# Patient Record
Sex: Female | Born: 1945 | Race: White | Hispanic: No | Marital: Single | Smoking: Never smoker
Health system: Southern US, Community
[De-identification: ages and names within clinical notes are randomized; demographics above are authoritative.]

## PROBLEM LIST (undated history)

## (undated) DIAGNOSIS — M810 Age-related osteoporosis without current pathological fracture: Secondary | ICD-10-CM

## (undated) DIAGNOSIS — M545 Low back pain, unspecified: Secondary | ICD-10-CM

## (undated) DIAGNOSIS — R002 Palpitations: Secondary | ICD-10-CM

## (undated) DIAGNOSIS — H269 Unspecified cataract: Secondary | ICD-10-CM

## (undated) DIAGNOSIS — F341 Dysthymic disorder: Secondary | ICD-10-CM

## (undated) DIAGNOSIS — F319 Bipolar disorder, unspecified: Secondary | ICD-10-CM

## (undated) DIAGNOSIS — J309 Allergic rhinitis, unspecified: Secondary | ICD-10-CM

## (undated) DIAGNOSIS — G250 Essential tremor: Secondary | ICD-10-CM

## (undated) DIAGNOSIS — G252 Other specified forms of tremor: Secondary | ICD-10-CM

## (undated) DIAGNOSIS — E785 Hyperlipidemia, unspecified: Secondary | ICD-10-CM

## (undated) DIAGNOSIS — F419 Anxiety disorder, unspecified: Secondary | ICD-10-CM

## (undated) DIAGNOSIS — D126 Benign neoplasm of colon, unspecified: Secondary | ICD-10-CM

## (undated) DIAGNOSIS — M1990 Unspecified osteoarthritis, unspecified site: Secondary | ICD-10-CM

## (undated) HISTORY — DX: Low back pain: M54.5

## (undated) HISTORY — DX: Essential tremor: G25.0

## (undated) HISTORY — DX: Anxiety disorder, unspecified: F41.9

## (undated) HISTORY — PX: BREAST EXCISIONAL BIOPSY: SUR124

## (undated) HISTORY — PX: EYE SURGERY: SHX253

## (undated) HISTORY — PX: POLYPECTOMY: SHX149

## (undated) HISTORY — DX: Low back pain, unspecified: M54.50

## (undated) HISTORY — DX: Age-related osteoporosis without current pathological fracture: M81.0

## (undated) HISTORY — DX: Hyperlipidemia, unspecified: E78.5

## (undated) HISTORY — DX: Unspecified cataract: H26.9

## (undated) HISTORY — PX: ANAL FISSURE REPAIR: SHX2312

## (undated) HISTORY — PX: OTHER SURGICAL HISTORY: SHX169

## (undated) HISTORY — PX: JOINT REPLACEMENT: SHX530

## (undated) HISTORY — DX: Allergic rhinitis, unspecified: J30.9

## (undated) HISTORY — DX: Dysthymic disorder: F34.1

## (undated) HISTORY — PX: CATARACT EXTRACTION: SUR2

## (undated) HISTORY — DX: Other specified forms of tremor: G25.2

## (undated) HISTORY — PX: BREAST BIOPSY: SHX20

---
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
## 1998-05-28 ENCOUNTER — Other Ambulatory Visit: Admission: RE | Admit: 1998-05-28 | Discharge: 1998-05-28 | Payer: Self-pay | Admitting: Obstetrics & Gynecology

## 1998-10-10 ENCOUNTER — Other Ambulatory Visit: Admission: RE | Admit: 1998-10-10 | Discharge: 1998-10-10 | Payer: Self-pay | Admitting: Obstetrics and Gynecology

## 1998-10-18 ENCOUNTER — Encounter: Admission: RE | Admit: 1998-10-18 | Discharge: 1998-10-18 | Payer: Self-pay | Admitting: Internal Medicine

## 1999-04-12 ENCOUNTER — Ambulatory Visit (HOSPITAL_BASED_OUTPATIENT_CLINIC_OR_DEPARTMENT_OTHER): Admission: RE | Admit: 1999-04-12 | Discharge: 1999-04-12 | Payer: Self-pay | Admitting: General Surgery

## 2000-01-24 ENCOUNTER — Encounter: Payer: Self-pay | Admitting: General Surgery

## 2000-01-24 ENCOUNTER — Encounter: Admission: RE | Admit: 2000-01-24 | Discharge: 2000-01-24 | Payer: Self-pay | Admitting: General Surgery

## 2000-02-10 ENCOUNTER — Other Ambulatory Visit: Admission: RE | Admit: 2000-02-10 | Discharge: 2000-02-10 | Payer: Self-pay | Admitting: Obstetrics and Gynecology

## 2000-04-08 ENCOUNTER — Other Ambulatory Visit: Admission: RE | Admit: 2000-04-08 | Discharge: 2000-04-08 | Payer: Self-pay | Admitting: Obstetrics and Gynecology

## 2000-04-08 ENCOUNTER — Encounter (INDEPENDENT_AMBULATORY_CARE_PROVIDER_SITE_OTHER): Payer: Self-pay | Admitting: Specialist

## 2000-04-14 ENCOUNTER — Encounter: Payer: Self-pay | Admitting: Obstetrics and Gynecology

## 2000-04-14 ENCOUNTER — Other Ambulatory Visit: Admission: RE | Admit: 2000-04-14 | Discharge: 2000-04-14 | Payer: Self-pay | Admitting: Obstetrics and Gynecology

## 2000-04-14 ENCOUNTER — Encounter: Admission: RE | Admit: 2000-04-14 | Discharge: 2000-04-14 | Payer: Self-pay | Admitting: Obstetrics and Gynecology

## 2000-04-14 ENCOUNTER — Encounter (INDEPENDENT_AMBULATORY_CARE_PROVIDER_SITE_OTHER): Payer: Self-pay | Admitting: *Deleted

## 2001-03-03 ENCOUNTER — Encounter: Admission: RE | Admit: 2001-03-03 | Discharge: 2001-03-03 | Payer: Self-pay | Admitting: Obstetrics and Gynecology

## 2001-03-03 ENCOUNTER — Encounter: Payer: Self-pay | Admitting: Obstetrics and Gynecology

## 2002-03-14 ENCOUNTER — Encounter: Payer: Self-pay | Admitting: General Surgery

## 2002-03-14 ENCOUNTER — Encounter: Admission: RE | Admit: 2002-03-14 | Discharge: 2002-03-14 | Payer: Self-pay | Admitting: General Surgery

## 2004-01-23 ENCOUNTER — Encounter: Admission: RE | Admit: 2004-01-23 | Discharge: 2004-01-23 | Payer: Self-pay | Admitting: Obstetrics and Gynecology

## 2004-07-22 ENCOUNTER — Other Ambulatory Visit: Admission: RE | Admit: 2004-07-22 | Discharge: 2004-07-22 | Payer: Self-pay | Admitting: Obstetrics and Gynecology

## 2005-02-11 ENCOUNTER — Encounter: Admission: RE | Admit: 2005-02-11 | Discharge: 2005-02-11 | Payer: Self-pay | Admitting: Obstetrics and Gynecology

## 2005-07-21 ENCOUNTER — Other Ambulatory Visit: Admission: RE | Admit: 2005-07-21 | Discharge: 2005-07-21 | Payer: Self-pay | Admitting: Obstetrics and Gynecology

## 2005-08-13 ENCOUNTER — Encounter: Admission: RE | Admit: 2005-08-13 | Discharge: 2005-08-13 | Payer: Self-pay | Admitting: General Surgery

## 2006-05-27 ENCOUNTER — Encounter: Admission: RE | Admit: 2006-05-27 | Discharge: 2006-05-27 | Payer: Self-pay | Admitting: Obstetrics and Gynecology

## 2006-07-27 ENCOUNTER — Other Ambulatory Visit: Admission: RE | Admit: 2006-07-27 | Discharge: 2006-07-27 | Payer: Self-pay | Admitting: Obstetrics and Gynecology

## 2007-09-07 ENCOUNTER — Other Ambulatory Visit: Admission: RE | Admit: 2007-09-07 | Discharge: 2007-09-07 | Payer: Self-pay | Admitting: Obstetrics and Gynecology

## 2007-10-27 ENCOUNTER — Ambulatory Visit (HOSPITAL_COMMUNITY): Admission: RE | Admit: 2007-10-27 | Discharge: 2007-10-27 | Payer: Self-pay | Admitting: Obstetrics and Gynecology

## 2008-06-21 ENCOUNTER — Ambulatory Visit (HOSPITAL_COMMUNITY): Admission: RE | Admit: 2008-06-21 | Discharge: 2008-06-21 | Payer: Self-pay | Admitting: Internal Medicine

## 2008-10-31 ENCOUNTER — Ambulatory Visit (HOSPITAL_COMMUNITY): Admission: RE | Admit: 2008-10-31 | Discharge: 2008-10-31 | Payer: Self-pay | Admitting: *Deleted

## 2009-03-01 ENCOUNTER — Encounter: Payer: Self-pay | Admitting: Internal Medicine

## 2009-03-01 LAB — CONVERTED CEMR LAB

## 2009-04-10 ENCOUNTER — Encounter: Admission: RE | Admit: 2009-04-10 | Discharge: 2009-04-10 | Payer: Self-pay | Admitting: Internal Medicine

## 2010-01-17 ENCOUNTER — Ambulatory Visit (HOSPITAL_COMMUNITY): Admission: RE | Admit: 2010-01-17 | Discharge: 2010-01-17 | Payer: Self-pay | Admitting: Obstetrics & Gynecology

## 2010-01-17 LAB — HM MAMMOGRAPHY

## 2010-09-16 ENCOUNTER — Inpatient Hospital Stay (HOSPITAL_COMMUNITY)
Admission: EM | Admit: 2010-09-16 | Discharge: 2010-09-17 | Payer: Self-pay | Source: Home / Self Care | Attending: Cardiology | Admitting: Cardiology

## 2010-09-16 ENCOUNTER — Encounter: Payer: Self-pay | Admitting: Internal Medicine

## 2010-09-16 LAB — CONVERTED CEMR LAB
BUN: 15 mg/dL
Chloride: 103 meq/L
Creatinine, Ser: 1 mg/dL
Glucose, Bld: 99 mg/dL
HCT: 42 %
Hemoglobin: 14.3 g/dL
MCV: 91.6 fL
Platelets: 289 10*3/uL
Potassium: 3.6 meq/L
RBC: 4.28 M/uL
RDW: 13.6 %
Sodium: 138 meq/L
WBC: 11.8 10*3/uL

## 2010-09-17 ENCOUNTER — Encounter: Payer: Self-pay | Admitting: Cardiology

## 2010-09-18 LAB — CBC
HCT: 39.2 % (ref 36.0–46.0)
Hemoglobin: 12.5 g/dL (ref 12.0–15.0)
MCH: 29.2 pg (ref 26.0–34.0)
MCHC: 31.9 g/dL (ref 30.0–36.0)
MCV: 91.6 fL (ref 78.0–100.0)
Platelets: 289 10*3/uL (ref 150–400)
RBC: 4.28 MIL/uL (ref 3.87–5.11)
RDW: 13.6 % (ref 11.5–15.5)
WBC: 11.8 10*3/uL — ABNORMAL HIGH (ref 4.0–10.5)

## 2010-09-18 LAB — CARDIAC PANEL(CRET KIN+CKTOT+MB+TROPI)
CK, MB: 1.5 ng/mL (ref 0.3–4.0)
Relative Index: INVALID (ref 0.0–2.5)
Total CK: 53 U/L (ref 7–177)
Troponin I: <0.01 ng/mL (ref 0.00–0.06)

## 2010-09-18 LAB — CK TOTAL AND CKMB (NOT AT ARMC)
CK, MB: 0.9 ng/mL (ref 0.3–4.0)
Relative Index: INVALID (ref 0.0–2.5)
Total CK: 33 U/L (ref 7–177)

## 2010-09-18 LAB — POCT I-STAT, CHEM 8
BUN: 15 mg/dL (ref 6–23)
Calcium, Ion: 1.18 mmol/L (ref 1.12–1.32)
Chloride: 103 mEq/L (ref 96–112)
Creatinine, Ser: 1 mg/dL (ref 0.4–1.2)
Glucose, Bld: 99 mg/dL (ref 70–99)
HCT: 42 % (ref 36.0–46.0)
Hemoglobin: 14.3 g/dL (ref 12.0–15.0)
Potassium: 3.6 mEq/L (ref 3.5–5.1)
Sodium: 138 mEq/L (ref 135–145)
TCO2: 29 mmol/L (ref 0–100)

## 2010-09-18 LAB — URINALYSIS, ROUTINE W REFLEX MICROSCOPIC
Bilirubin Urine: NEGATIVE
Hgb urine dipstick: NEGATIVE
Ketones, ur: 15 mg/dL — AB
Leukocytes, UA: NEGATIVE
Nitrite: NEGATIVE
Protein, ur: NEGATIVE mg/dL
Specific Gravity, Urine: 1.013 (ref 1.005–1.030)
Urine Glucose, Fasting: NEGATIVE mg/dL
Urobilinogen, UA: 0.2 mg/dL (ref 0.0–1.0)
pH: 8 (ref 5.0–8.0)

## 2010-09-18 LAB — URINE MICROSCOPIC-ADD ON

## 2010-09-18 LAB — POCT CARDIAC MARKERS
CKMB, poc: <1 ng/mL — ABNORMAL LOW (ref 1.0–8.0)
CKMB, poc: <1 ng/mL — ABNORMAL LOW (ref 1.0–8.0)
Myoglobin, poc: 76.8 ng/mL (ref 12–200)
Myoglobin, poc: 56.6 ng/mL (ref 12–200)
Troponin i, poc: <0.05 ng/mL (ref 0.00–0.09)
Troponin i, poc: <0.05 ng/mL (ref 0.00–0.09)

## 2010-09-18 LAB — TROPONIN I: Troponin I: <0.01 ng/mL (ref 0.00–0.06)

## 2010-09-23 ENCOUNTER — Encounter: Payer: Self-pay | Admitting: Internal Medicine

## 2010-09-25 ENCOUNTER — Telehealth (INDEPENDENT_AMBULATORY_CARE_PROVIDER_SITE_OTHER): Payer: Self-pay | Admitting: *Deleted

## 2010-09-26 ENCOUNTER — Encounter: Payer: Self-pay | Admitting: *Deleted

## 2010-09-26 ENCOUNTER — Encounter: Payer: Self-pay | Admitting: Cardiology

## 2010-09-26 ENCOUNTER — Ambulatory Visit: Admission: RE | Admit: 2010-09-26 | Discharge: 2010-09-26 | Payer: Self-pay | Source: Home / Self Care

## 2010-09-26 ENCOUNTER — Encounter (HOSPITAL_COMMUNITY)
Admission: RE | Admit: 2010-09-26 | Discharge: 2010-10-01 | Payer: Self-pay | Source: Home / Self Care | Attending: Cardiology | Admitting: Cardiology

## 2010-09-30 ENCOUNTER — Ambulatory Visit
Admission: RE | Admit: 2010-09-30 | Discharge: 2010-09-30 | Payer: Self-pay | Source: Home / Self Care | Attending: Physician Assistant | Admitting: Physician Assistant

## 2010-09-30 DIAGNOSIS — F341 Dysthymic disorder: Secondary | ICD-10-CM | POA: Insufficient documentation

## 2010-09-30 DIAGNOSIS — R079 Chest pain, unspecified: Secondary | ICD-10-CM | POA: Insufficient documentation

## 2010-10-03 NOTE — Assessment & Plan Note (Addendum)
Summary: Cardiology Nuclear Testing  Nuclear Med Background Indications for Stress Test: Evaluation for Ischemia, Post Hospital  Indications Comments: 09/16/10 chest pain, (-) enzymes  History: Echo  History Comments: 1/12 Echo:EF=55-60%, atrial septal aneurysm  Symptoms: Chest Tightness, Diaphoresis, Rapid HR  Symptoms Comments: Last episode of ZO:XWRU since d/c   Nuclear Pre-Procedure Cardiac Risk Factors: Family History - CAD Caffeine/Decaff Intake: None NPO After: 11:00 PM Lungs: Clear IV 0.9% NS with Angio Cath: 22g     IV Site: R Forearm IV Started by: Irean Hong, RN Chest Size (in) 34     Cup Size A     Height (in): 63.5 Weight (lb): 135 BMI: 23.62  Nuclear Med Study 1 or 2 day study:  1 day     Stress Test Type:  Stress Reading MD:  Cassell Clement, MD     Referring MD:  Marca Ancona, MD Resting Radionuclide:  Technetium 16m Tetrofosmin     Resting Radionuclide Dose:  11.0 mCi  Stress Radionuclide:  Technetium 54m Tetrofosmin     Stress Radionuclide Dose:  33.0 mCi   Stress Protocol Exercise Time (min):  7:16 min     Max HR:  155 bpm     Predicted Max HR:  155 bpm  Max Systolic BP: 153 mm Hg     Percent Max HR:  100 %     METS: 7.8 Rate Pressure Product:  04540    Stress Test Technologist:  [employer]a [employer], CMA-N     Nuclear Technologist:  Harlow Asa, CNMT  Rest Procedure  Myocardial perfusion imaging was performed at rest 45 minutes following the intravenous administration of Technetium 87m Tetrofosmin.  Stress Procedure  The patient exercised for 7:16 utilizing the Bruce protocol.  The patient stopped due to fatigue and denied any chest pain.  There were no diagnostic ST-T wave changes.  There were occasional PAC's and PVC's.  She had a slight drop in BP with exercise; 153/79 to 139/71 at peak exercise.  Technetium 49m Tetrofosmin was injected at peak exercise and myocardial perfusion imaging was performed after a brief delay.  QPS Raw Data Images:   Normal; no motion artifact; normal heart/lung ratio. Stress Images:  Normal homogeneous uptake in all areas of the myocardium. Rest Images:  Normal homogeneous uptake in all areas of the myocardium. Subtraction (SDS):  No evidence of ischemia. Transient Ischemic Dilatation:  1.01  (Normal <1.22)  Lung/Heart Ratio:  0.28  (Normal <0.45)  Quantitative Gated Spect Images QGS EDV:  66 ml QGS ESV:  24 ml QGS EF:  64 %  Findings Normal nuclear study      Overall Impression  Exercise Capacity: Good exercise capacity. BP Response: Normal blood pressure response. Clinical Symptoms: No chest pain ECG Impression: No significant ST segment change suggestive of ischemia. Overall Impression: Normal stress nuclear study. Overall Impression Comments: No wall motion abnormalities.  Appended Document: Cardiology Nuclear Testing normal study

## 2010-10-03 NOTE — Progress Notes (Signed)
Summary: Nuclear Pre-Procedure  Phone Note Outgoing Call Call back at [facility] Phone [phone]   Call placed by: Stanton Kidney, EMT-P,  September 25, 2010 2:27 PM Call placed to: Patient Action Taken: Phone Call Completed Summary of Call: Reviewed information on Myoview Information Sheet (see scanned document for further details).  Spoke with the patient. Stanton Kidney, EMT-P  September 25, 2010 2:28 PM     Nuclear Med Background Indications for Stress Test: Evaluation for Ischemia, Post Hospital  Indications Comments: 09/16/10 CP, (-) enzymes  History: Echo  History Comments: 1/12 Echo: NL, EF=55-60%  Symptoms: Chest Tightness

## 2010-10-09 NOTE — Assessment & Plan Note (Signed)
Summary: eph.gd   Visit Type:  eph Primary Provider:  Dr. Felicity Coyer  CC:  no complaints today.  History of Present Illness: Primary Electrophysiologist:  Dr. Sherryl Manges  Leah Jensen is a 65 yo female with a h/o depression and anxiety who was admitted 1/16 to 1/17 with chest pain.  She r/o for MI.  Echo demonstrated EF 55-60%, atrial septal aneurysm and normal wall motion.  She was set up for an outpatient myoview.  This was done 09/26/10 and was normal with EF 64%.  She returns for follow up.  She denies any further chest pain.  She denies shortness of breath.  She denies syncope.  She feels that the symptoms that brought her to the hospital were from a panic attack.  She does have a significant history of anxiety.   Current Medications (verified): 1)  Pristiq 50 Mg Xr24h-Tab (Desvenlafaxine Succinate) .... Take 1 Tablet By Mouth Once A Day 2)  Abilify 5 Mg Tabs (Aripiprazole) .... 1/2 Tab Once Daily 3)  Clonazepam 0.5 Mg Tbdp (Clonazepam) .... 1/2 Tablet Once Daily 4)  Propranolol Hcl 10 Mg Tabs (Propranolol Hcl) .[name].. 1-2 Tablets Once Daily 5)  Bio-Est .... Use Cream Every Night 6)  Prednisone 20 Mg Tabs (Prednisone) .... As Directed 7)  Aspirin 81 Mg Tbec (Aspirin) .... Take One Tablet By Mouth Daily  Allergies (verified): 1)  Amoxicillin  Past History:  Past Medical History: Depression/anxiety.  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  All other systems reviewed and negative.   Vital Signs:  Patient profile:   65 year old female Height:      63.5 inches Weight:      140.25 pounds BMI:     24.54 Pulse rate:   64 / minute BP sitting:   110 / 68  (left arm)  Vitals Entered By: Celestia Khat, CMA (September 30, 2010 4:52 PM)  Physical Exam  General:  Well nourished, well developed, in no acute  distress HEENT: normal Neck: no JVD at 90 degrees Cardiac:  normal S1, S2; RRR; no murmur Lungs:  clear to auscultation bilaterally, no wheezing, rhonchi or rales Abd: soft, nontender, no hepatomegaly Ext: no edema Skin: warm and dry Neuro:  CNs 2-12 intact, no focal abnormalities noted    Impression & Recommendations:  Problem # 1:  CHEST PAIN UNSPECIFIED (ICD-786.50) Her symptoms were likely from anxiety.  Her echocardiogram and nuclear study were both normal.  She can followup with cardiology on a p.r.n. basis.  Problem # 2:  DEPRESSION/ANXIETY (ICD-300.4) Followup with primary care.

## 2010-10-10 NOTE — H&P (Signed)
Leah Jensen, RAYMOND NO.:  [id number]  MEDICAL RECORD NO.:  [id number]          PATIENT TYPE:  INP  LOCATION:  2004                         FACILITY:  [facility]  PHYSICIAN:  Marca Ancona, MD      DATE OF BIRTH:  March 08, 1946  DATE OF ADMISSION:  09/16/2010 DATE OF DISCHARGE:                             HISTORY & PHYSICAL   CHIEF COMPLAINT:  Chest pain.  HISTORY OF PRESENT ILLNESS:  Leah Jensen is a 65 year old Caucasian female with known history of coronary artery disease nor any other risk factors other than her age and only significant medical history being of depression and anxiety who presents with episode of substernal chest tightness and diaphoresis in the setting of significant increase in stress recently secondary to being laid off from her job and then asked to rejoin her job again.  The patient was in her usual state of health until last Friday when she was laid off from her job and then few hours later called and asked to please restart her position that she just been laid off from.  The patient loves her job and was significant stressed regarding this event and was very worried all weekend about coming back to work on Monday. Today at work, she had an episode of chest tightness in the substernal area that she describes as severe and worsening over several minutes. She also had diaphoresis, but no tachypalpitations, shortness of breath, nausea or other symptoms except after ambulating a short distance, she did feel mild lightheadedness, but no true presyncope.  She described her symptoms to her boss and EMS was contacted.  She was subsequently taken to [facility] ED.  En route, she was given four baby aspirins to chew and few minutes after this, her pain resolved and this is not recurred. The patient denies any similar episodes in the past.  EKG shows normal sinus rhythm without concerning changes.  Point-of-care markers are negative x2.  Vital signs with  slightly low blood pressure with systolic ranging from 98-106 and diastolic 52-60, otherwise vital signs within normal limits and stable.  Chest x-ray shows no acute disease.  Labs unremarkable.  She is currently resting comfortably, although she does look mildly anxious when discussing her work situation.  PAST MEDICAL HISTORY: 1. Depression/anxiety. 2. Essential tremor.  SOCIAL HISTORY:  The patient works at [employer] in company and very active physically position, ambulating throughout the day, doing many different types of jobs.  No tobacco, EtOH, or illicit drug use history.  No herbal meds.  No special diet.  No regular exercise, but very active at work as above.  REVIEW OF SYSTEMS:  The patient had strep throat 2 weeks ago and took amoxicillin and had a rash and subsequently took prednisone with new resolution of symptoms currently.  No other recent changes except for stress and anxiety as described above.  Please note the patient ambulates all day at her work without any exertional symptoms.  Othersystems reviewed and were negative.  CODE STATUS:  Full.  ALLERGIES AND INTOLERANCES:  AMOXICILLIN (rash).  MEDICATIONS: 1. Pristiq 2 mg p.o. daily. 2. Clonazepam 0.25 mg p.o. q.a.m.  and 0.5 mg p.o. nightly p.r.n. 3. Propranolol 10 mg p.o. q.a.m. and again in the afternoon p.r.n.  PHYSICAL EXAMINATION:  VITAL SIGNS:  Temperature 97.9 degrees Fahrenheit, BP 98-106/52-60 with pulse of 61, respirations are 11-22, O2 saturations are 100% on 2 liters by nasal cannula. GENERAL:  The patient is alert and oriented x3 in no apparent distress, able to peak easily in full sentences without respiratory distress. HEENT:  Head is normocephalic and atraumatic.  Pupils are equal, round and reactive to light.  Extraocular muscles are intact.  Nares are patent without discharge.  Oropharynx without erythema or exudates. NECK:  Supple without lymphadenopathy.  No thyromegaly.  No JVD. HEART:  Rate  is regular with audible S1 and S2.  No clicks, rubs, murmurs, or gallops.  Pulses are 2+ and equal in both upper and extremities bilaterally. LUNGS:  Clear to auscultation bilaterally. SKIN:  No rashes, lesions, or petechiae. ABDOMEN:  Soft, nontender, nondistended.  Normal abdominal bowel sounds. No rebound or guarding.  No hepatosplenomegaly. EXTREMITIES:  No clubbing, cyanosis, or edema. MUSCULOSKELETAL:  Without joint deformity or effusions.  No spinal or CVA tenderness. NEUROLOGIC:  Cranial nerves II through XII grossly intact.  Strength are 5/5 in all extremities and axis groups.  Normal sensation throughout and normal cerebellar function.  RADIOLOGY:  Chest x-ray showed no acute disease.  EKG:  Sinus bradycardia at a rate of 50 bpm with nonspecific ST-T wave changes in I and AVL, otherwise, ST-T wave changes normal, normal axis, no significant Q-waves, no evidence of hypertrophy.  PR 180, QRS 86, and QTc of 385.  LABORATORY DATA:  WBC is 11.8, HGB is 14.3, HCT 42.0, PLT count is 289. Sodium 138, potassium 3.6, chloride 103, bicarb 99, BUN 15, creatinine 1.0, glucose 99.  Point-of-care markers are negative x2.  ASSESSMENT AND PLAN:  Leah Jensen is a 65 year old Caucasian female with minimal past medical history including depression/anxiety, but no known history of coronary artery disease, other risk factors other than an age who presents with an episode of chest discomfort in the setting of significant increase in stress.  Her chest discomfort lasted approximately 30 minutes and began at work while she was not exerting herself and resolves spontaneously.  Please note, the patient has minimal risk factors and has been under a lot of stress at work lately.  PLAN:  Chest pain - we will plan to cycle cardiac enzymes and check another EKG in the morning.  If cardiac enzymes are negative and EKG does not show significant changes as well, there being no significant recurrence  of her chest discomfort, will be seen in the a.m. and schedule outpatient Lexiscan Myoview.  We will also check a 2-D echocardiogram to rule out Takotsubo cardiomyopathy (the patient had a significant shock last week as the news of losing her job and then being asked to restart only a few hours later, worried about at all weekend).     Jarrett Ables, PAC   ______________________________ Marca Ancona, MD    MS/MEDQ  D:  09/16/2010  T:  09/17/2010  Job:  [id number]  Electronically Signed by Jarrett Ables PAC on 09/20/2010 01:34:37 PM Electronically Signed by Marca Ancona MD on 10/10/2010 08:33:22 AM

## 2010-10-10 NOTE — Discharge Summary (Signed)
NAMEJANAA, ACERO NO.:  [id number]  MEDICAL RECORD NO.:  [id number]          PATIENT TYPE:  INP  LOCATION:  2004                         FACILITY:  [facility]  PHYSICIAN:  Marca Ancona, MD      DATE OF BIRTH:  December 28, 1945  DATE OF ADMISSION:  09/16/2010 DATE OF DISCHARGE:  09/17/2010                              DISCHARGE SUMMARY   PRIMARY CARDIOLOGIST:  Duke Salvia, MD, [facility]  DISCHARGE DIAGNOSES: 1. Chest pain without objective evidence of ischemia. 2. Depression. 3. Anxiety. 4. Essential tremor.  ALLERGIES:  AMOXICILLIN.  PROCEDURES:  A 2D echocardiogram September 17, 2010, showing an EF of 5- 60% with normal wall motion.  There was an atrial septal aneurysm.  HISTORY OF PRESENT ILLNESS:  A 65 year old female without prior history of coronary artery disease who was in her usual state of health until Friday, September 13, 2010, when she was laid off from work and then just a few hours later asked to resume her position.  She was quite stressed over the weekend and on Monday, September 16, 2010, while at work had an episode of chest tightness associated with diaphoresis.  EMS was contacted and the patient states the [facility] ED.  En route, she was given four baby aspirin and pain resolved.  ECG showed no acute changes and point-of-care markers were negative.  The patient was admitted for further evaluation.  HOSPITAL COURSE:  The patient is ruled out for MI.  She has had no recurrence of chest pain.  ECG remained nonacute.  She underwent 2D echocardiogram today, which shows normal LV function as outlined above. Plan to discharge her today, and I have arranged for her to undergo an exercise Myoview on September 26, 2010, in our office.  DISCHARGE LABORATORY DATA:  Hemoglobin 14.3, hematocrit 42.0, WBC 11.8, platelets 289.  Sodium 138, potassium 3.6, chloride 103, BUN 15, creatinine 1.0, glucose 99, CK 53, MB 1.5, troponin I less than 0.01. Urinalysis was  negative.  DISPOSITION:  The patient will be discharged home today in good condition.  FOLLOWUP PLANS AND APPOINTMENTS:  The patient will undergo exercise Myoview on September 26, 2010, at 8:00 a.m.  She has been instructed to remain n.p.o. that morning and also hold her beta-blocker.  She will follow up with Tereso Newcomer, PA-C, in our office on September 30, 2010, at 9:30 a.m. to go over stress test results.  DISCHARGE MEDICATIONS: 1. Aspirin 81 mg daily. 2. Nitroglycerin 0.4 mg sublingual p.r.n. chest pain. 3. Abilify 5 mg half tablet at bedtime. 4. Calcium OTC 1 tablet b.i.d. 5. Clonazepam 0.5 mg half tablet daily. 6. DHEA 5 mg daily. 7. Fish oil over-the-counter 1 capsule b.i.d. 8. Lamictal 25 mg at bedtime. 9. Magnesium over-the-counter 1 tablet at bedtime. 10.Pristiq XR 50 mg at bedtime. 11.Propranolol 10 mg daily. 12.Vitamin B complex 1 tablet q.p.m. 13.Vitamin B12 1 tablet q.p.m. 14.Vitamin D3 1 tablet b.i.d.  OUTSTANDING LABORATORY STUDIES:  Exercise Myoview is pending.  DURATION OF DISCHARGE ENCOUNTER:  35 minutes including physician time.     Nicolasa Ducking, ANP   ______________________________ Marca Ancona, MD    CB/MEDQ  D:  09/17/2010  T:  09/18/2010  Job:  [id number]  Electronically Signed by Nicolasa Ducking ANP on 09/30/2010 12:23:30 PM Electronically Signed by Marca Ancona MD on 10/10/2010 08:33:14 AM

## 2010-10-23 ENCOUNTER — Ambulatory Visit (INDEPENDENT_AMBULATORY_CARE_PROVIDER_SITE_OTHER): Payer: Medicare PPO | Admitting: Internal Medicine

## 2010-10-23 ENCOUNTER — Encounter: Payer: Self-pay | Admitting: Internal Medicine

## 2010-10-23 DIAGNOSIS — M1710 Unilateral primary osteoarthritis, unspecified knee: Secondary | ICD-10-CM

## 2010-10-23 DIAGNOSIS — J309 Allergic rhinitis, unspecified: Secondary | ICD-10-CM

## 2010-10-23 DIAGNOSIS — F341 Dysthymic disorder: Secondary | ICD-10-CM

## 2010-10-23 DIAGNOSIS — G250 Essential tremor: Secondary | ICD-10-CM | POA: Insufficient documentation

## 2010-10-23 DIAGNOSIS — Z23 Encounter for immunization: Secondary | ICD-10-CM

## 2010-10-25 ENCOUNTER — Other Ambulatory Visit (HOSPITAL_COMMUNITY): Payer: Self-pay | Admitting: Obstetrics

## 2010-10-25 DIAGNOSIS — Z780 Asymptomatic menopausal state: Secondary | ICD-10-CM

## 2010-10-29 NOTE — Assessment & Plan Note (Signed)
Summary: NEW/MEDICARE/#/CD   Vital Signs:  Patient profile:   65 year old female Height:      63.5 inches (161.29 cm) Weight:      141.6 pounds (64.36 kg) O2 Sat:      99 % on Room air Temp:     98.5 degrees F (36.94 degrees C) oral Pulse rate:   70 / minute BP sitting:   92 / 62  (left arm) Cuff size:   regular  Vitals Entered By: Orlan Leavens RMA (October 23, 2010 9:23 AM)  O2 Flow:  Room air CC: New patient Is Patient Diabetic? No Pain Assessment Patient in pain? no      Comments Pt statrs she was seen in hosp back in January (Panic attack)   Primary Care Provider:  Dr. Felicity Coyer  CC:  New patient.  History of Present Illness: new pt to me and our practice, here to est care - prev followed with gyn and psyc only -  1) depression/anxiety - follows with psyc in [place] - reports compliance with ongoing medical treatment and no changes in medication dose or frequency. denies adverse side effects related to current therapy. hosp overnight 09/2010 for CP which was related to panic attack (after neg myoview stress testing and echo)   2) OA, R knee - prev taking glucos but now on organic tumeric with good relief of daily pain symptoms - no swelling, no popping or falls - no injury hx recalled  3) allg rhinitis - reports compliance with ongoing medical treatment and no changes in medication dose or frequency. denies adverse side effects related to current therapy.   4) essential tremor - L hand> r hand and LE - use propanolol to control same but dose limited by low bp with titration of bbloc -   Preventive Screening-Counseling & Management  Alcohol-Tobacco     Alcohol drinks/day: 0     Alcohol Counseling: not indicated; patient does not drink     Smoking Status: never     Tobacco Counseling: not indicated; no tobacco use  Caffeine-Diet-Exercise     Does Patient Exercise: no     Exercise Counseling: not indicated; exercise is adequate  Safety-Violence-Falls     Seat Belt  Counseling: not indicated; patient wears seat belts     Helmet Counseling: not applicable     Firearm Counseling: not indicated; uses recommended firearm safety measures     Smoke Detector Counseling: no     Violence Counseling: not indicated; no violence risk noted     Fall Risk Counseling: not indicated; no significant falls noted  Clinical Review Panels:  Prevention   Last Mammogram:  Done @ [facility] No specific mammographic evidence of malignancy.  Assessment: BIRADS 1. (01/17/2010)   Last Pap Smear:  Interpretation Result:Negative for intraepithelial Lesion or Malignancy.    (03/01/2009)  Immunizations   Last Tetanus Booster:  Historical (09/01/2005)   Last Pneumovax:  Pneumovax (Medicare) (10/23/2010)  CBC   WBC:  11.8 (09/16/2010)   RBC:  4.28 (09/16/2010)   Hgb:  14.3 (09/16/2010)   Hct:  42.0 (09/16/2010)   Platelets:  289 (09/16/2010)   MCV  91.6 (09/16/2010)   RDW  13.6 (09/16/2010)  Complete Metabolic Panel   Glucose:  99 (09/16/2010)   Sodium:  138 (09/16/2010)   Potassium:  3.6 (09/16/2010)   Chloride:  103 (09/16/2010)   BUN:  15 (09/16/2010)   Creatinine:  1.0 (09/16/2010)   -  Date:  09/16/2010    WBC:  11.8    HGB: 14.3    HCT: 42.0    RBC: 4.28    PLT: 289    MCV: 91.6    RDW: 13.6    BG Random: 99    BUN: 15    Creatinine: 1.0    Sodium: 138    Potassium: 3.6    Chloride: 103  Current Medications (verified): 1)  Pristiq 50 Mg Xr24h-Tab (Desvenlafaxine Succinate) .... Take 1 Tablet By Mouth Once A Day 2)  Abilify 5 Mg Tabs (Aripiprazole) .... 1/2 Tab Once Daily 3)  Clonazepam 0.5 Mg Tbdp (Clonazepam) .... 1/2 Tablet Once Every Morning 4)  Bio-Est .... Use Cream Every Night 5)  Prednisone 20 Mg Tabs (Prednisone) .... As Directed 6)  Aspirin 81 Mg Tbec (Aspirin) .... Take One Tablet By Mouth Daily 7)  Propranolol Hcl 20 Mg Tabs (Propranolol Hcl) .... Take 1 By Mouth Once Daily 8)  Calcium Citrate-Vitamin 500-630/mg .... Take 1 By Mouth  Once Daily 9)  Fish Oil 1000 Mg Caps (Omega-3 Fatty Acids) .... Take 1 Two Times A Day 10)  One-A-Day Womens Formula  Tabs (Multiple Vitamins-Calcium) .... Take 1 By Mouth Once Daily 11)  Vitamin D3 1000 Unit Tabs (Cholecalciferol) .... Take 1 Two Times A Day 12)  B Complex  Tabs (B Complex Vitamins) .... Take 1 By Mouth in The Evening 13)  Dhea 10 Mg Tabs (Prasterone (Dhea)) .... Take 1 By Mouth Once Daily 14)  Calcium-Magnesium-Zinc 333-133-8.3 Mg Tabs (Calcium-Magnesium-Zinc) .... Take 1 By Mouth Once Daily 15)  Tumeric Organic 370mg  .... Take 1 By Mouth Once Daily  Allergies (verified): 1)  Amoxicillin  Past History:  Past Medical History: Depression/anxiety Essential tremor osteoarthritis Echo 09/2010: EF 55-60%, atrial septal aneurysm, normal [facility] Myoview 09/26/10: normal, EF 64% Stevens-Johnson syndrome hx: allergy from amoxicillin  Past Surgical History: Breast biopsy (1990) Child birth (60 & 28)   Family History: Family History of Arthritis (both parent)  Mom is 13- Heart disease, no intervention Dad is 80- Heart disease, had bypass surgery age 59  Social History: Prev painter and Doctor, hospital, now works at [employer] and remains very active. ambulating throughout the day, doing many different types of jobs.   No tobacco, EtOH, or illicit drug use history.   No herbal meds.  No special diet.  No regular exercise, but very active at work remotely divorced, lives alone and singleSmoking Status:  never Does Patient Exercise:  no  Review of Systems       see HPI above. I have reviewed all other systems and they were negative.   Physical Exam  General:  alert, well-developed, well-nourished, and cooperative to examination.    Head:  Normocephalic and atraumatic without obvious abnormalities. No apparent alopecia or balding. Eyes:  vision grossly intact; pupils equal, round and reactive to light.  conjunctiva and lids normal.    Ears:  normal pinnae bilaterally,  without erythema, swelling, or tenderness to palpation. TMs clear, without effusion, or cerumen impaction. Hearing grossly normal bilaterally  Mouth:  teeth and gums in good repair; mucous membranes moist, without lesions or ulcers. oropharynx clear without exudate, no erythema.  Lungs:  normal respiratory effort, no intercostal retractions or use of accessory muscles; normal breath sounds bilaterally - no crackles and no wheezes.    Heart:  normal rate, regular rhythm, no murmur, and no rub. BLE without edema.  Abdomen:  soft, non-tender, normal bowel sounds, no distention; no masses and no appreciable hepatomegaly or splenomegaly.  Genitalia:  defer Msk:  right knee: full range of motion, no joint effusion or swelling. no erythema or abnormal warmth. Stable to ligamentous testing. Nontender to palpation. Neurovascularly intact.  Neurologic:  alert & oriented X3 and cranial nerves II-XII symetrically intact.  strength normal in all extremities, sensation intact to light touch, and gait normal. speech fluent without dysarthria or aphasia; follows commands with good comprehension.  Skin:  no rashes, vesicles, ulcers, or erythema. No nodules or irregularity to palpation.  Psych:  Oriented X3, memory intact for recent and remote, normally interactive, good eye contact, not anxious appearing, not depressed appearing, and not agitated.      Impression & Recommendations:  Problem # 1:  DEPRESSION/ANXIETY (ICD-300.4) well controlled - follows with psyc for same - dr. Veneda Melter in [place] cont same - no changes rec  Problem # 2:  TREMOR, ESSENTIAL (ICD-333.1) uses Bbloc to control symptoms in L>R hand and LE - cont same watching BP  Problem # 3:  OSTEOARTHRITIS, KNEE, RIGHT (ICD-715.96)  cont organic tumeric and conserv tx as ongoing Her updated medication list for this problem includes:    Aspirin 81 Mg Tbec (Aspirin) .[name]... Take one tablet by mouth daily  Discussed strengthening exercises, use of  ice or heat, and medications.   Problem # 4:  ALLERGIC RHINITIS (ICD-477.9)  Discussed use of allergy medications and environmental measures.  Time spent with patient 32 minutes, more than 50% of this time was spent counseling patient on depression and anxiety hx with hosp for panic attack sx, problems concerning the need for ongoing psyc care and plans to obtain prior records  Complete Medication List: 1)  Pristiq 50 Mg Xr24h-tab (Desvenlafaxine succinate) .... Take 1 tablet by mouth once a day 2)  Abilify 5 Mg Tabs (Aripiprazole) .... 1/2 tab once daily 3)  Clonazepam 0.5 Mg Tbdp (Clonazepam) .... 1/2 tablet once every morning 4)  Bio-est  .... Use cream every night 5)  Prednisone 20 Mg Tabs (Prednisone) .... As directed 6)  Aspirin 81 Mg Tbec (Aspirin) .... Take one tablet by mouth daily 7)  Propranolol Hcl 20 Mg Tabs (Propranolol hcl) .... Take 1 by mouth once daily 8)  Calcium Citrate-vitamin 500-630/mg  .... Take 1 by mouth once daily 9)  Fish Oil 1000 Mg Caps (Omega-3 fatty acids) .... Take 1 two times a day 10)  One-a-day Womens Formula Tabs (Multiple vitamins-calcium) .... Take 1 by mouth once daily 11)  Vitamin D3 1000 Unit Tabs (Cholecalciferol) .... Take 1 two times a day 12)  B Complex Tabs (B complex vitamins) .... Take 1 by mouth in the evening 13)  Dhea 10 Mg Tabs (Prasterone (dhea)) .... Take 1 by mouth once daily 14)  Calcium-magnesium-zinc 333-133-8.3 Mg Tabs (Calcium-magnesium-zinc) .... Take 1 by mouth once daily 15)  Tumeric Organic 370mg   .... Take 1 by mouth once daily  Other Orders: Pneumococcal Vaccine (16109) Admin 1st Vaccine (60454) Gynecologic Referral (Gyn)  Patient Instructions: 1)  it was good to see you today. 2)  medicatins and history reviewed today - no changes recommended 3)  we'll make referral to gynecology for your PAP/pelvic. Our office will contact you regarding this appointment once made.  4)  continue to follow with dr. Veneda Melter as  ongoing 5)  pneumovax today - consider Zostavax (shingles prevention) 6)  Please schedule a follow-up appointment in 6 months to review further, call sooner if problems.    Orders Added: 1)  Pneumococcal Vaccine [90732] 2)  Admin 1st Vaccine [  90471] 3)  New Patient Level III [16109] 4)  Gynecologic Referral [Gyn]   Immunization History:  Tetanus/Td Immunization History:    Tetanus/Td:  historical (09/01/2005)  Immunizations Administered:  Pneumonia Vaccine:    Vaccine Type: Pneumovax (Medicare)    Site: right deltoid    Mfr: Merck    Dose: 0.5 ml    Route: IM    Given by: Orlan Leavens RMA    Exp. Date: 01/24/2012    Lot #: [id number]    VIS given: 10/23/10   Immunization History:  Tetanus/Td Immunization History:    Tetanus/Td:  Historical (09/01/2005)  Immunizations Administered:  Pneumonia Vaccine:    Vaccine Type: Pneumovax (Medicare)    Site: right deltoid    Mfr: Merck    Dose: 0.5 ml    Route: IM    Given by: Orlan Leavens RMA    Exp. Date: 01/24/2012    Lot #: [id number]    VIS given: 10/23/10    Pap Smear  Procedure date:  03/01/2009  Findings:      Interpretation Result:Negative for intraepithelial Lesion or Malignancy.     Mammogram  Procedure date:  01/17/2010  Findings:      Done @ [facility] No specific mammographic evidence of malignancy.  Assessment: BIRADS 1.

## 2010-11-11 ENCOUNTER — Ambulatory Visit (HOSPITAL_COMMUNITY)
Admission: RE | Admit: 2010-11-11 | Discharge: 2010-11-11 | Disposition: A | Discharge status: Home / Self Care | Payer: Medicare PPO | Source: Ambulatory Visit | Attending: Obstetrics | Admitting: Obstetrics

## 2010-11-11 DIAGNOSIS — Z780 Asymptomatic menopausal state: Secondary | ICD-10-CM | POA: Insufficient documentation

## 2010-11-11 DIAGNOSIS — Z13820 Encounter for screening for osteoporosis: Secondary | ICD-10-CM | POA: Insufficient documentation

## 2011-01-14 ENCOUNTER — Other Ambulatory Visit: Payer: Self-pay | Admitting: Internal Medicine

## 2011-01-14 DIAGNOSIS — Z1231 Encounter for screening mammogram for malignant neoplasm of breast: Secondary | ICD-10-CM

## 2011-01-23 ENCOUNTER — Ambulatory Visit (HOSPITAL_COMMUNITY): Payer: Medicare PPO

## 2011-01-24 ENCOUNTER — Ambulatory Visit (HOSPITAL_COMMUNITY): Payer: Medicare PPO

## 2011-02-21 ENCOUNTER — Ambulatory Visit (HOSPITAL_COMMUNITY): Payer: Medicare PPO

## 2011-03-03 ENCOUNTER — Ambulatory Visit (HOSPITAL_COMMUNITY)
Admission: RE | Admit: 2011-03-03 | Discharge: 2011-03-03 | Disposition: A | Discharge status: Home / Self Care | Payer: Medicare PPO | Source: Ambulatory Visit | Attending: Internal Medicine | Admitting: Internal Medicine

## 2011-03-03 ENCOUNTER — Encounter: Payer: Self-pay | Admitting: Internal Medicine

## 2011-03-03 ENCOUNTER — Ambulatory Visit (INDEPENDENT_AMBULATORY_CARE_PROVIDER_SITE_OTHER): Payer: Medicare PPO | Admitting: Internal Medicine

## 2011-03-03 VITALS — BP 102/70 | HR 68 | Temp 98.7°F | Ht 63.5 in | Wt 136.8 lb

## 2011-03-03 DIAGNOSIS — IMO0002 Reserved for concepts with insufficient information to code with codable children: Secondary | ICD-10-CM

## 2011-03-03 DIAGNOSIS — Z1231 Encounter for screening mammogram for malignant neoplasm of breast: Secondary | ICD-10-CM | POA: Insufficient documentation

## 2011-03-03 DIAGNOSIS — M5416 Radiculopathy, lumbar region: Secondary | ICD-10-CM

## 2011-03-03 DIAGNOSIS — M545 Low back pain: Secondary | ICD-10-CM | POA: Insufficient documentation

## 2011-03-03 MED ORDER — PREDNISONE (PAK) 10 MG PO TABS: 10.0000 mg | ORAL_TABLET | ORAL | Status: AC

## 2011-03-03 MED ORDER — TIZANIDINE HCL 4 MG PO TABS: 4.0000 mg | ORAL_TABLET | Freq: Three times a day (TID) | ORAL | Status: DC | PRN

## 2011-03-03 NOTE — Patient Instructions (Signed)
It was good to see you today. Use Pred pak for inflammation and generic zanaflex for muscle relaxer as discussed - Your prescription(s) have been submitted to your pharmacy. Please take as directed and contact our office if you believe you are having problem(s) with the medication(s). If pain worse or unimproved after 2 weeks, call for other evaluation and treatment as discussed Ok to change scheduled followup next month to 6 months out, call sooner if problems.

## 2011-03-03 NOTE — Progress Notes (Signed)
  Subjective:    Patient ID: Leah Jensen, female    DOB: 01/27/1946, 65 y.o.   MRN: [id number]  HPI complains of left leg pain Onset 2 weeks ago Started in left low back - back pain now improved Pain radiates from left buttock, down posterior left thigh and calf to foot No weakness or numbness No falls or precipitating trauma Pain worse with standing or lying on left side, better with activity  Past Medical History  Diagnosis Date  . ALLERGIC RHINITIS   . OSTEOARTHRITIS, KNEE, RIGHT   . DEPRESSION/ANXIETY   . TREMOR, ESSENTIAL      Review of Systems  Constitutional: Negative for fever and unexpected weight change.  Cardiovascular: Negative for chest pain.  Musculoskeletal: Negative for gait problem.  Neurological: Negative for weakness.       Objective:   Physical Exam BP 102/70  Pulse 68  Temp(Src) 98.7 F (37.1 C) (Oral)  Ht 5' 3.5" (1.613 m)  Wt 136 lb 12.8 oz (62.052 kg)  BMI 23.85 kg/m2  SpO2 98% Physical Exam  Constitutional: She is oriented to person, place, and time. She appears well-developed and well-nourished. No distress.  Neck: Normal range of motion. Neck supple. No JVD present. No thyromegaly present.  Cardiovascular: Normal rate, regular rhythm and normal heart sounds.  No murmur heard. No BLE edema. Pulmonary/Chest: Effort normal and breath sounds normal. No respiratory distress. She has no wheezes.  Musculoskeletal: Back: full range of motion of thoracic and lumbar spine. Non tender to palpation. Negative straight leg raise. DTR's are symmetrically intact. Sensation intact in all dermatomes of the lower extremities. Full strength to manual muscle testing and able to heel toe walk without difficulty and ambulates with a normal gait. Neurological: She is alert and oriented to person, place, and time. No cranial nerve deficit. Coordination normal.  Skin: Skin is warm and dry. No rash noted. No erythema.  Psychiatric: She has a normal mood and affect.  Her behavior is normal. Judgment and thought content normal.   Lab Results  Component Value Date   WBC 11.8* 09/16/2010   HGB 14.3 09/16/2010   HCT 42.0 09/16/2010   PLT 289 09/16/2010   NA 138 09/16/2010   K 3.6 09/16/2010   CL 103 09/16/2010   CREATININE 1.0 09/16/2010   BUN 15 09/16/2010        Assessment & Plan:  Lumbar pain with LLE radiculopathy - no weakness neuro deficit or red flags on hx - treat conserv - pred pak and muscle relaxers - erx done Pt to call if unimproved in 2 weeks, sooner if worse

## 2011-03-10 ENCOUNTER — Telehealth: Payer: Self-pay

## 2011-03-10 ENCOUNTER — Other Ambulatory Visit: Payer: Self-pay | Admitting: Internal Medicine

## 2011-03-10 DIAGNOSIS — M545 Low back pain: Secondary | ICD-10-CM

## 2011-03-10 NOTE — Telephone Encounter (Signed)
Pt is okay with Ortho referral, she does not have a specific group/MD she prefers.

## 2011-03-10 NOTE — Telephone Encounter (Signed)
Pt called back and stated that she has completed Pred and Zanaflex she was Rx'd by VAL but the pain has returned. Pt states it is of the same intensity as when she came in for OV. Pt is requesting advisement from MD. Should medication be refilled?

## 2011-03-10 NOTE — Telephone Encounter (Signed)
I can refer to orthopedic, is that ok?  Does she have ortho she prefers?

## 2011-03-10 NOTE — Telephone Encounter (Signed)
referall done per emr 

## 2011-04-21 ENCOUNTER — Telehealth: Payer: Self-pay

## 2011-04-21 DIAGNOSIS — D229 Melanocytic nevi, unspecified: Secondary | ICD-10-CM

## 2011-04-21 NOTE — Telephone Encounter (Signed)
Ok - please find dx code to attach to derm refer - thanks

## 2011-04-21 NOTE — Telephone Encounter (Signed)
Pt came into clinic requesting referral to Dr Danella Deis. Pt is an established pt but per [facility] she will need a referral.

## 2011-04-22 NOTE — Telephone Encounter (Signed)
Left message on machine for pt to return my call  

## 2011-04-23 ENCOUNTER — Ambulatory Visit: Payer: Medicare PPO | Admitting: Internal Medicine

## 2011-04-23 NOTE — Telephone Encounter (Signed)
Left message on machine for pt to return my call  

## 2011-04-24 NOTE — Telephone Encounter (Signed)
Pt states she goes to Dermatology for yearly evaluation. No history of same.

## 2011-04-24 NOTE — Telephone Encounter (Signed)
Order done for change in skin mole - thx

## 2011-09-02 DIAGNOSIS — D126 Benign neoplasm of colon, unspecified: Secondary | ICD-10-CM

## 2011-09-02 HISTORY — DX: Benign neoplasm of colon, unspecified: D12.6

## 2011-09-02 HISTORY — PX: COLONOSCOPY: SHX174

## 2011-09-05 ENCOUNTER — Encounter: Payer: Self-pay | Admitting: Internal Medicine

## 2011-09-29 ENCOUNTER — Other Ambulatory Visit (INDEPENDENT_AMBULATORY_CARE_PROVIDER_SITE_OTHER): Payer: Medicare PPO

## 2011-09-29 ENCOUNTER — Ambulatory Visit (INDEPENDENT_AMBULATORY_CARE_PROVIDER_SITE_OTHER): Payer: Medicare PPO | Admitting: Internal Medicine

## 2011-09-29 ENCOUNTER — Telehealth: Payer: Self-pay | Admitting: *Deleted

## 2011-09-29 ENCOUNTER — Encounter: Payer: Self-pay | Admitting: Internal Medicine

## 2011-09-29 VITALS — BP 102/60 | HR 62 | Temp 97.3°F | Ht 64.0 in | Wt 136.0 lb

## 2011-09-29 DIAGNOSIS — F341 Dysthymic disorder: Secondary | ICD-10-CM

## 2011-09-29 DIAGNOSIS — Z124 Encounter for screening for malignant neoplasm of cervix: Secondary | ICD-10-CM

## 2011-09-29 DIAGNOSIS — Z1211 Encounter for screening for malignant neoplasm of colon: Secondary | ICD-10-CM

## 2011-09-29 DIAGNOSIS — Z13220 Encounter for screening for lipoid disorders: Secondary | ICD-10-CM

## 2011-09-29 DIAGNOSIS — M810 Age-related osteoporosis without current pathological fracture: Secondary | ICD-10-CM

## 2011-09-29 DIAGNOSIS — Z0000 Encounter for general adult medical examination without abnormal findings: Secondary | ICD-10-CM

## 2011-09-29 DIAGNOSIS — Z79899 Other long term (current) drug therapy: Secondary | ICD-10-CM

## 2011-09-29 LAB — LIPID PANEL: HDL: 73.4 mg/dL (ref >39.00)

## 2011-09-29 MED ORDER — ALENDRONATE SODIUM 70 MG PO TABS: 70.0000 mg | ORAL_TABLET | ORAL | Status: DC

## 2011-09-29 NOTE — Patient Instructions (Signed)
It was good to see you today. Test(s) ordered today. Your results will be called to you after review (48-72hours after test completion). If any changes need to be made, you will be notified at that time. we'll make referral to gynecology and colonoscopy within [facility] providers. Our office will contact you regarding appointment(s) once made. Start fosamax weekly for bones - Your prescription(s) have been submitted to your pharmacy. Please take as directed and contact our office if you believe you are having problem(s) with the medication(s). Continue Calcium + Vit D as reviewed today -  Please schedule followup annually, call sooner if problems.

## 2011-09-29 NOTE — Telephone Encounter (Signed)
Pt is requesting referral to see GYN md. Prefer to see a women. Pt states she doesn't want to see Dr. Thomasena Edis...09/29/11@2 :43pm/LMB

## 2011-09-29 NOTE — Assessment & Plan Note (Signed)
DEXA 10/2010 reviewed - on Ca + Vit D approp and WB exercise Start bisphos weekly - new erx done Risk/benefit reviewed - pt agrees to same

## 2011-09-29 NOTE — Progress Notes (Signed)
Subjective:    Patient ID: Leah Jensen, female    DOB: [birth year], 66 y.o.   MRN: [id number]  HPI  Here for medicare wellness  Diet: heart healthy  Physical activity: active WB exercises Depression/mood screen: negative Hearing: intact to whispered voice Visual acuity: grossly normal, performs annual eye exam  ADLs: capable Fall risk: none Home safety: good Cognitive evaluation: intact to orientation, naming, recall and repetition EOL planning: adv directives, full code/ I agree  I have personally reviewed and have noted 1. The patient's medical and social history 2. Their use of alcohol, tobacco or illicit drugs 3. Their current medications and supplements 4. The patient's functional ability including ADL's, fall risks, home safety risks and hearing or visual impairment. 5. Diet and physical activities 6. Evidence for depression or mood disorders  Also reviewed chronic medical issues:  depression/anxiety - follows with psyc in [place] - reports compliance with ongoing medical treatment and no changes in medication dose or frequency. denies adverse side effects related to current therapy. hosp overnight 09/2010 for chest pain which was related to panic attack (after neg myoview stress testing and echo)     osteoarthritis , R knee - prev taking G-C but now on organic tumeric with good relief of daily pain symptoms - no swelling, no popping or falls - no injury hx recalled   allergic rhinitis - reports compliance with ongoing medical treatment and no changes in medication dose or frequency. denies adverse side effects related to current therapy.     essential tremor - L hand> r hand and LE - uses propanolol to control same but dose limited by low bp with titration of bbloc -    Osteoporosis - reviewed results of DEXA 10/2010> -4.5 spine - denies bone pain or new back pain - no fractures, no falls - takes Vit D and Ca daily   Past Medical History  Diagnosis Date  . ALLERGIC  RHINITIS   . OSTEOARTHRITIS, KNEE, RIGHT   . DEPRESSION/ANXIETY   . TREMOR, ESSENTIAL   . Low back pain 03/03/2011   Family History  Problem Relation Age of Onset  . Arthritis Mother   . Heart disease Mother   . Arthritis Father   . Heart disease Father     History  Substance Use Topics  . Smoking status: Never Smoker   . Smokeless tobacco: Not on file   Comment: Divorced, lives alone and single  . Alcohol Use: No     Review of Systems Constitutional: Negative for fever or weight change.  Respiratory: Negative for cough and shortness of breath.   Cardiovascular: Negative for chest pain or palpitations.  Gastrointestinal: Negative for abdominal pain, no bowel changes.  Musculoskeletal: Negative for gait problem or joint swelling.  Skin: Negative for rash.  Neurological: Negative for dizziness or headache.  No other specific complaints in a complete review of systems (except as listed in HPI above).     Objective:   Physical Exam BP 102/60  Pulse 62  Temp(Src) 97.3 F (36.3 C) (Oral)  Ht 5\' 4"  (1.626 m)  Wt 136 lb (61.689 kg)  BMI 23.34 kg/m2  SpO2 97% Wt Readings from Last 3 Encounters:  09/29/11 136 lb (61.689 kg)  03/03/11 136 lb 12.8 oz (62.052 kg)  10/23/10 141 lb 9.6 oz (64.229 kg)   Constitutional: She appears well-developed and well-nourished. No distress.  Neck: Normal range of motion. Neck supple. No JVD present. No thyromegaly present.  Cardiovascular: Normal rate, regular rhythm and normal  heart sounds.  No murmur heard. No BLE edema. Pulmonary/Chest: Effort normal and breath sounds normal. No respiratory distress. She has no wheezes.  Psychiatric: She has a normal mood and affect. Her behavior is normal. Judgment and thought content normal.   Lab Results  Component Value Date   WBC 11.8* 09/16/2010   HGB 14.3 09/16/2010   HCT 42.0 09/16/2010   PLT 289 09/16/2010   GLUCOSE 99 09/16/2010   NA 138 09/16/2010   K 3.6 09/16/2010   CL 103 09/16/2010    CREATININE 1.0 09/16/2010   BUN 15 09/16/2010      Assessment & Plan:  AWV - v70.0 - Today patient counseled on age appropriate routine health concerns for screening and prevention, each reviewed and up to date or declined. Immunizations reviewed and up to date or declined. Labs ordered & reviewed. Risk factors for depression reviewed and negative. Hearing function and visual acuity are intact. ADLs screened and addressed as needed. Functional ability and level of safety reviewed and appropriate. Education, counseling and referrals performed based on assessed risks today. Patient provided with a copy of personalized plan for preventive services.  Screening lipids - draw today Refer gyn and screening colo

## 2011-09-30 ENCOUNTER — Encounter: Payer: Self-pay | Admitting: Internal Medicine

## 2011-09-30 NOTE — Assessment & Plan Note (Signed)
Works with psyc in [place] - meds reviewed, no changes symptoms stable - continue same

## 2011-09-30 NOTE — Telephone Encounter (Signed)
Refer to gyn already done 1/27 - please let [facility] know pt pref

## 2011-09-30 NOTE — Telephone Encounter (Signed)
Called pt no answer LMOM md ok referral will be contacted by [facility] with appt, date, and time...09/30/11@11 :45pm/LMB

## 2011-11-26 ENCOUNTER — Encounter: Payer: Self-pay | Admitting: Gastroenterology

## 2011-12-26 ENCOUNTER — Encounter: Payer: Self-pay | Admitting: Gastroenterology

## 2011-12-26 ENCOUNTER — Ambulatory Visit (AMBULATORY_SURGERY_CENTER): Payer: Medicare PPO | Admitting: *Deleted

## 2011-12-26 VITALS — Ht 63.5 in | Wt 142.0 lb

## 2011-12-26 DIAGNOSIS — Z1211 Encounter for screening for malignant neoplasm of colon: Secondary | ICD-10-CM

## 2011-12-26 MED ORDER — PEG-KCL-NACL-NASULF-NA ASC-C 100 G PO SOLR: ORAL | Status: DC

## 2012-01-06 ENCOUNTER — Ambulatory Visit (AMBULATORY_SURGERY_CENTER): Payer: Medicare PPO | Admitting: Gastroenterology

## 2012-01-06 ENCOUNTER — Encounter: Payer: Self-pay | Admitting: Gastroenterology

## 2012-01-06 VITALS — BP 106/66 | HR 84 | Temp 96.1°F | Resp 19 | Ht 63.5 in | Wt 142.0 lb

## 2012-01-06 DIAGNOSIS — D126 Benign neoplasm of colon, unspecified: Secondary | ICD-10-CM

## 2012-01-06 DIAGNOSIS — Z1211 Encounter for screening for malignant neoplasm of colon: Secondary | ICD-10-CM

## 2012-01-06 MED ORDER — SODIUM CHLORIDE 0.9 % IV SOLN: 500.0000 mL | INTRAVENOUS | Status: DC

## 2012-01-06 NOTE — Patient Instructions (Signed)
YOU HAD AN ENDOSCOPIC PROCEDURE TODAY AT THE [facility]: Refer to the procedure report that was given to you for any specific questions about what was found during the examination.  If the procedure report does not answer your questions, please call your gastroenterologist to clarify.  If you requested that your care partner not be given the details of your procedure findings, then the procedure report has been included in a sealed envelope for you to review at your convenience later.  YOU SHOULD EXPECT: Some feelings of bloating in the abdomen. Passage of more gas than usual.  Walking can help get rid of the air that was put into your GI tract during the procedure and reduce the bloating. If you had a lower endoscopy (such as a colonoscopy or flexible sigmoidoscopy) you may notice spotting of blood in your stool or on the toilet paper. If you underwent a bowel prep for your procedure, then you may not have a normal bowel movement for a few days.  DIET: Your first meal following the procedure should be a light meal and then it is ok to progress to your normal diet.  A half-sandwich or bowl of soup is an example of a good first meal.  Heavy or fried foods are harder to digest and may make you feel nauseous or bloated.  Likewise meals heavy in dairy and vegetables can cause extra gas to form and this can also increase the bloating.  Drink plenty of fluids but you should avoid alcoholic beverages for 24 hours.  ACTIVITY: Your care partner should take you home directly after the procedure.  You should plan to take it easy, moving slowly for the rest of the day.  You can resume normal activity the day after the procedure however you should NOT DRIVE or use heavy machinery for 24 hours (because of the sedation medicines used during the test).    SYMPTOMS TO REPORT IMMEDIATELY: A gastroenterologist can be reached at any hour.  During normal business hours, 8:30 AM to 5:00 PM Monday through Friday,  call [phone].  After hours and on weekends, please call the GI answering service at [phone] who will take a message and have the physician on call contact you.   Following lower endoscopy (colonoscopy or flexible sigmoidoscopy):  Excessive amounts of blood in the stool  Significant tenderness or worsening of abdominal pains  Swelling of the abdomen that is new, acute  Fever of 100F or higher  Following upper endoscopy (EGD)  Vomiting of blood or coffee ground material  New chest pain or pain under the shoulder blades  Painful or persistently difficult swallowing  New shortness of breath  Fever of 100F or higher  Black, tarry-looking stools  FOLLOW UP: If any biopsies were taken you will be contacted by phone or by letter within the next 1-3 weeks.  Call your gastroenterologist if you have not heard about the biopsies in 3 weeks.  Our staff will call the home number listed on your records the next business day following your procedure to check on you and address any questions or concerns that you may have at that time regarding the information given to you following your procedure. This is a courtesy call and so if there is no answer at the home number and we have not heard from you through the emergency physician on call, we will assume that you have returned to your regular daily activities without incident.  SIGNATURES/CONFIDENTIALITY: You and/or your care   partner have signed paperwork which will be entered into your electronic medical record.  These signatures attest to the fact that that the information above on your After Visit Summary has been reviewed and is understood.  Full responsibility of the confidentiality of this discharge information lies with you and/or your care-partner.  

## 2012-01-06 NOTE — Op Note (Signed)
[facility] [address] [place], [place]  [address]  COLONOSCOPY PROCEDURE REPORT  PATIENT:  Leah Jensen, Leah Jensen  MR#:  [id number] BIRTHDATE:  06/03/1946, 65 yrs. old  GENDER:  female ENDOSCOPIST:  Judie Petit T. Russella Dar, MD, [facility] Referred by:  Rene Paci, M.D. PROCEDURE DATE:  01/06/2012 PROCEDURE:  Colonoscopy with biopsy and snare polypectomy ASA CLASS:  Class II INDICATIONS:  1) Routine Risk Screening MEDICATIONS:   propofol (Diprivan) 200 mg IV DESCRIPTION OF PROCEDURE:  After the risks benefits and alternatives of the procedure were thoroughly explained, informed consent was obtained.  Digital rectal exam was performed and revealed no abnormalities.  The LB CF-[id number] endoscope was introduced through the anus and advanced to the cecum, which was identified by both the appendix and ileocecal valve, without limitations. The quality of the prep was excellent, using MoviPrep.  The instrument was then slowly withdrawn as the colon was fully examined. <<PROCEDUREIMAGES>> FINDINGS:  A sessile polyp was found in the descending colon. It was 6 mm in size. Polyp was snared without cautery. Retrieval was successful.  A sessile polyp was found in the sigmoid colon. It was 4 mm in size. The polyp was removed using cold biopsy forceps. Otherwise normal colonoscopy without other polyps, masses, vascular ectasias, or inflammatory changes.   Retroflexed views in the rectum revealed internal hemorrhoids, small.  The time to cecum =  3.67  minutes. The scope was then withdrawn (time =  9.5 min) from the patient and the procedure completed.  COMPLICATIONS:  None  ENDOSCOPIC IMPRESSION: 1) 6 mm sessile polyp in the descending colon 2) 4 mm sessile polyp in the sigmoid colon 3) Internal hemorrhoids  RECOMMENDATIONS: 1) Await pathology results 2) If the polyps are adenomatous (pre-cancerous), repeat colonoscopy in 5 years. Otherwise follow colorectal cancer screening  guidelines for "routine risk" patients with colonoscopy in 10 years.  Venita Lick. Russella Dar, MD, [facility]  n. eSIGNED:   Venita Lick. Howard Patton at 01/06/2012 09:15 AM  Jerrel Ivory, [phone]

## 2012-01-06 NOTE — Progress Notes (Signed)
Patient did not experience any of the following events: a burn prior to discharge; a fall within the facility; wrong site/side/patient/procedure/implant event; or a hospital transfer or hospital admission upon discharge from the facility. (G8907) Patient did not have preoperative order for IV antibiotic SSI prophylaxis. (G8918)  

## 2012-01-07 ENCOUNTER — Telehealth: Payer: Self-pay | Admitting: *Deleted

## 2012-01-07 NOTE — Telephone Encounter (Signed)
Left message

## 2012-01-12 ENCOUNTER — Encounter: Payer: Self-pay | Admitting: Gastroenterology

## 2012-02-17 ENCOUNTER — Ambulatory Visit (INDEPENDENT_AMBULATORY_CARE_PROVIDER_SITE_OTHER): Payer: Medicare PPO | Admitting: Internal Medicine

## 2012-02-17 ENCOUNTER — Encounter: Payer: Self-pay | Admitting: Internal Medicine

## 2012-02-17 VITALS — BP 102/72 | HR 76 | Temp 98.3°F | Ht 63.5 in | Wt 141.8 lb

## 2012-02-17 DIAGNOSIS — F341 Dysthymic disorder: Secondary | ICD-10-CM

## 2012-02-17 DIAGNOSIS — J309 Allergic rhinitis, unspecified: Secondary | ICD-10-CM

## 2012-02-17 DIAGNOSIS — J029 Acute pharyngitis, unspecified: Secondary | ICD-10-CM

## 2012-02-17 MED ORDER — LEVOFLOXACIN 250 MG PO TABS: 250.0000 mg | ORAL_TABLET | Freq: Every day | ORAL | Status: AC

## 2012-02-17 NOTE — Patient Instructions (Addendum)
Take all new medications as prescribed - the antibiotic Continue all other medications as before Please call or return for any further symptom or concerns

## 2012-02-22 ENCOUNTER — Encounter: Payer: Self-pay | Admitting: Internal Medicine

## 2012-02-22 NOTE — Progress Notes (Signed)
Subjective:    Patient ID: Leah Jensen, female    DOB: [birth year], 66 y.o.   MRN: [id number]  HPI   Here with 3 days acute onset fever, sever ST, pressure, general weakness and malaise, but little to no cough and Pt denies chest pain, increased sob or doe, wheezing, orthopnea, PND, increased LE swelling, palpitations, dizziness or syncope.  Pt denies new neurological symptoms such as new headache, or facial or extremity weakness or numbness  Pt denies polydipsia, polyuria,but has had several wks mild nasal allergy symptoms with clear congestion, itch and sneeze. Denies worsening depressive symptoms, suicidal ideation, or panic, though has ongoing anxiety, not increased recently.  Past Medical History  Diagnosis Date  . ALLERGIC RHINITIS   . OSTEOARTHRITIS, KNEE, RIGHT   . DEPRESSION/ANXIETY   . TREMOR, ESSENTIAL   . Low back pain 03/03/2011  . 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 Anxiety    Past Surgical History  Procedure Date  . Breast surgery 1190    Breast biopsy  . Child birth     x's 2 (80 & 39)    reports that she has never smoked. She does not have any smokeless tobacco history on file. She reports that she does not drink alcohol or use illicit drugs. family history includes Arthritis in her father and mother and Heart disease in her father and mother. Allergies  Allergen Reactions  . Amoxicillin     REACTION: Rash   Current Outpatient Prescriptions on File Prior to Visit  Medication Sig Dispense Refill  . alendronate (FOSAMAX) 70 MG tablet Take 1 tablet (70 mg total) by mouth every 7 (seven) days. Take with a full glass of water on an empty stomach.  4 tablet  11  . b complex vitamins tablet Take 1 tablet by mouth daily.        [name] CALCIUM CITRATE-VITAMIN D PO Take by mouth 2 (two) times daily.        [name] CALCIUM-MAGNESUIUM-ZINC 333-133-8.3 MG TABS Take by mouth daily.        . Cholecalciferol (VITAMIN D3) 1000 UNITS CAPS Take  by mouth 2 (two) times daily.        . clonazePAM (KLONOPIN) 0.5 MG tablet Take 1/2 tab every morning      . desvenlafaxine (PRISTIQ) 50 MG 24 hr tablet Take 50 mg by mouth daily.        [name] lamoTRIgine (LAMICTAL) 25 MG tablet       . Multiple Vitamins-Calcium (ONE-A-DAY WOMENS PO) Take by mouth daily.        . naproxen sodium (ANAPROX) 220 MG tablet Take 220 mg by mouth as needed.        . Omega-3 Fatty Acids (FISH OIL) 1000 MG CAPS Take by mouth daily.        . propranolol (INDERAL) 20 MG tablet Take 1/2 tab daily      . vitamin B-12 (CYANOCOBALAMIN) 1000 MCG tablet Take 1,000 mcg by mouth daily.        . vitamin C (ASCORBIC ACID) 500 MG tablet Take 500 mg by mouth 4 (four) times daily.        Review of Systems Review of Systems  Constitutional: Negative for diaphoresis and unexpected weight change.  HENT: Negative for tinnitus.   Eyes: Negative for photophobia and visual disturbance.  Respiratory: Negative for stridor.   Gastrointestinal: Negative for vomiting and blood in stool.  Genitourinary: Negative for hematuria and decreased  urine volume.  Musculoskeletal: Negative for gait problem.  Neurological: Negative for tremors and numbness.  Psychiatric/Behavioral: Negative for decreased concentration. The patient is not hyperactive.       Objective:   Physical Exam BP 102/72  Pulse 76  Temp 98.3 F (36.8 C) (Oral)  Ht 5' 3.5" (1.613 m)  Wt 141 lb 12 oz (64.297 kg)  BMI 24.72 kg/m2  SpO2 94% Physical Exam  VS noted. Mild ill Constitutional: Pt appears well-developed and well-nourished.  HENT: Head: Normocephalic.  Right Ear: External ear normal.  Left Ear: External ear normal.  Bilat tm's mild erythema.  Sinus nontender.  Pharynx marked erythema, swelling with mild exudate Eyes: Conjunctivae and EOM are normal. Pupils are equal, round, and reactive to light.  Neck: Normal range of motion. Neck supple.  Cardiovascular: Normal rate and regular rhythm.   Pulmonary/Chest: Effort  normal and breath sounds normal.  Neurological: Pt is alert. Not confused Skin: Skin is warm. No erythema.  Psychiatric: Pt behavior is normal. Thought content normal. 1+ nervous    Assessment & Plan:

## 2012-02-22 NOTE — Assessment & Plan Note (Signed)
Mild to mod, for allegra otc prn,  to f/u any worsening symptoms or concerns 

## 2012-02-22 NOTE — Assessment & Plan Note (Signed)
Mild to mod, for antibx course,  to f/u any worsening symptoms or concerns 

## 2012-02-22 NOTE — Assessment & Plan Note (Signed)
stable overall by hx and exam, , and pt to continue medical treatment as before   

## 2012-03-08 ENCOUNTER — Other Ambulatory Visit: Payer: Self-pay | Admitting: Internal Medicine

## 2012-03-08 DIAGNOSIS — Z1231 Encounter for screening mammogram for malignant neoplasm of breast: Secondary | ICD-10-CM

## 2012-03-23 ENCOUNTER — Ambulatory Visit (HOSPITAL_COMMUNITY)
Admission: RE | Admit: 2012-03-23 | Discharge: 2012-03-23 | Disposition: A | Discharge status: Home / Self Care | Payer: Medicare PPO | Source: Ambulatory Visit | Attending: Internal Medicine | Admitting: Internal Medicine

## 2012-03-23 DIAGNOSIS — Z1231 Encounter for screening mammogram for malignant neoplasm of breast: Secondary | ICD-10-CM

## 2012-06-09 ENCOUNTER — Encounter: Payer: Self-pay | Admitting: Internal Medicine

## 2012-06-09 ENCOUNTER — Ambulatory Visit (INDEPENDENT_AMBULATORY_CARE_PROVIDER_SITE_OTHER): Payer: Medicare PPO | Admitting: Internal Medicine

## 2012-06-09 VITALS — BP 100/76 | HR 62 | Temp 98.4°F | Ht 63.5 in | Wt 137.0 lb

## 2012-06-09 DIAGNOSIS — J309 Allergic rhinitis, unspecified: Secondary | ICD-10-CM

## 2012-06-09 DIAGNOSIS — B370 Candidal stomatitis: Secondary | ICD-10-CM

## 2012-06-09 DIAGNOSIS — M1710 Unilateral primary osteoarthritis, unspecified knee: Secondary | ICD-10-CM

## 2012-06-09 DIAGNOSIS — K121 Other forms of stomatitis: Secondary | ICD-10-CM

## 2012-06-09 DIAGNOSIS — K120 Recurrent oral aphthae: Secondary | ICD-10-CM

## 2012-06-09 MED ORDER — MAGIC MOUTHWASH W/LIDOCAINE: 5.0000 mL | Freq: Four times a day (QID) | ORAL | Status: DC | PRN

## 2012-06-09 NOTE — Assessment & Plan Note (Signed)
Follows with orthopedist Dr. Farris Has for same Synthetic viscus solution injection series summer 2013 after overuse flare following colonoscopy Symptoms controlled and exam stable today Continue organic and over-the-counter supplementation for pain symptoms as ongoing

## 2012-06-09 NOTE — Patient Instructions (Addendum)
It was good to see you today. We have reviewed your prior records including labs and tests today Medications reviewed and updated at this time. Use the Magic mouthwash with lidocaine to treat tongue and mouth sore - Your prescription(s) have been submitted to your pharmacy. Please take as directed and contact our office if you believe you are having problem(s) with the medication(s). If continued mouth problems, please check with your dentist for further treatment advice Check with your pharmacy about the flu shot and shingles vaccine as discussed Followup in January as scheduled, or call sooner if problemsCandida Infection, Adult A candida infection (also called yeast, fungus and Monilia infection) is an overgrowth of yeast that can occur anywhere on the body. A yeast infection commonly occurs in warm, moist body areas. Usually, the infection remains localized but can spread to become a systemic infection. A yeast infection may be a sign of a more severe disease such as diabetes, leukemia, or AIDS. A yeast infection can occur in both men and women. In women, Candida vaginitis is a vaginal infection. It is one of the most common causes of vaginitis. Men usually do not have symptoms or know they have an infection until other problems develop. Men may find out they have a yeast infection because their sex partner has a yeast infection. Uncircumcised men are more likely to get a yeast infection than circumcised men. This is because the uncircumcised glans is not exposed to air and does not remain as dry as that of a circumcised glans. Older adults may develop yeast infections around dentures. CAUSES   Women  Antibiotics.   Steroid medication taken for a long time.   Being overweight (obese).   Diabetes.   Poor immune condition.   Certain serious medical conditions.   Immune suppressive medications for organ transplant patients.   Chemotherapy.   Pregnancy.   Menstration.   Stress and  fatigue.   Intravenous drug use.   Oral contraceptives.   Wearing tight-fitting clothes in the crotch area.   Catching it from a sex partner who has a yeast infection.   Spermicide.   Intravenous, urinary, or other catheters.  Men  Catching it from a sex partner who has a yeast infection.   Having oral or anal sex with a person who has the infection.   Spermicide.   Diabetes.   Antibiotics.   Poor immune system.   Medications that suppress the immune system.   Intravenous drug use.   Intravenous, urinary, or other catheters.  SYMPTOMS   Women  Thick, white vaginal discharge.   Vaginal itching.   Redness and swelling in and around the vagina.   Irritation of the lips of the vagina and perineum.   Blisters on the vaginal lips and perineum.   Painful sexual intercourse.   Low blood sugar (hypoglycemia).   Painful urination.   Bladder infections.   Intestinal problems such as constipation, indigestion, bad breath, bloating, increase in gas, diarrhea, or loose stools.  Men  Men may develop intestinal problems such as constipation, indigestion, bad breath, bloating, increase in gas, diarrhea, or loose stools.   Dry, cracked skin on the penis with itching or discomfort.   Jock itch.   Dry, flaky skin.   Athlete's foot.   Hypoglycemia.  DIAGNOSIS  Women  A history and an exam are performed.   The discharge may be examined under a microscope.   A culture may be taken of the discharge.  Men  A history and  an exam are performed.   Any discharge from the penis or areas of cracked skin will be looked at under the microscope and cultured.   Stool samples may be cultured.  TREATMENT   Women  Vaginal antifungal suppositories and creams.   Medicated creams to decrease irritation and itching on the outside of the vagina.   Warm compresses to the perineal area to decrease swelling and discomfort.   Oral antifungal medications.   Medicated  vaginal suppositories or cream for repeated or recurrent infections.   Wash and dry the irritation areas before applying the cream.   Eating yogurt with lactobacillus may help with prevention and treatment.   Sometimes painting the vagina with gentian violet solution may help if creams and suppositories do not work.  Men  Antifungal creams and oral antifungal medications.   Sometimes treatment must continue for 30 days after the symptoms go away to prevent recurrence.  HOME CARE INSTRUCTIONS   Women  Use cotton underwear and avoid tight-fitting clothing.   Avoid colored, scented toilet paper and deodorant tampons or pads.   Do not douche.   Keep your diabetes under control.   Finish all the prescribed medications.   Keep your skin clean and dry.   Consume milk or yogurt with lactobacillus active culture regularly. If you get frequent yeast infections and think that is what the infection is, there are over-the-counter medications that you can get. If the infection does not show healing in 3 days, talk to your caregiver.   Tell your sex partner you have a yeast infection. Your partner may need treatment also, especially if your infection does not clear up or recurs.  Men  Keep your skin clean and dry.   Keep your diabetes under control.   Finish all prescribed medications.   Tell your sex partner that you have a yeast infection so they can be treated if necessary.  SEEK MEDICAL CARE IF:    Your symptoms do not clear up or worsen in one week after treatment.   You have an oral temperature above 102 F (38.9 C).   You have trouble swallowing or eating for a prolonged time.   You develop blisters on and around your vagina.   You develop vaginal bleeding and it is not your menstrual period.   You develop abdominal pain.   You develop intestinal problems as mentioned above.   You get weak or lightheaded.   You have painful or increased urination.   You have pain  during sexual intercourse.  MAKE SURE YOU:    Understand these instructions.   Will watch your condition.   Will get help right away if you are not doing well or get worse.  Document Released: 09/25/2004 Document Revised: 11/10/2011 Document Reviewed: 01/07/2010 [facility] Patient Information 2013 [place] [address] [place], [place].   Stomatitis Stomatitis is an inflammation of the mucous lining of the mouth. It can affect part of the mouth or the whole mouth. The intensity of symptoms can range from mild to severe. It can affect your cheek, teeth, gums, lips, or tongue. In almost all cases, the lining of the mouth becomes swollen, red, and painful. Painful ulcers can develop in your mouth. Stomatitis recurs in some people. CAUSES   There are many common causes of stomatitis. They include:  Viruses (such as cold sores or shingles).   Canker sores.   Bacteria (such as ulcerative gingivitis or sexually transmitted diseases).   Fungus or yeast (such as candidiasis or oral thrush).  Poor oral hygiene and poor nutrition (Vincent's stomatitis or trench mouth).   Lack of vitamin B, vitamin C, or niacin.   Dentures or braces that do not fit properly.   High acid foods (uncommon).   Sharp or broken teeth.   Cheek biting.   Breathing through the mouth.   Chewing tobacco.   Allergy to toothpaste, mouthwash, candy, gum, lipstick, or some medicines.   Burning your mouth with hot drinks or food.   Exposure to dyes, heavy metals, acid fumes, or mineral dust.  SYMPTOMS    Painful ulcers in the mouth.   Blisters in the mouth.   Bleeding gums.   Swollen gums.   Irritability.   Bad breath.   Bad taste in the mouth.   Fever.   Trouble eating because of burning and pain in the mouth.  DIAGNOSIS   Your caregiver will examine your mouth and look for bleeding gums and mouth ulcers. Your caregiver may ask you about the medicines you are taking. Your caregiver may suggest a blood test and tissue  sample (biopsy) of the mouth ulcer or mass if either is present. This will help find the cause of your condition. TREATMENT   Your treatment will depend on the cause of your condition. Your caregiver will first try to treat your symptoms.    You may be given pain medicine. Topical anesthetic may be used to numb the area if you have severe pain.   Your caregiver may prescribe antibiotic medicine if you have a bacterial infection.   Your caregiver may prescribe antifungal medicine if you have a fungal infection.   You may need to take antiviral medicine if you have a viral infection like herpes.   You may be asked to use medicated mouth rinses.   Your caregiver will advise you about proper brushing and using a soft toothbrush. You also need to get your teeth cleaned regularly.  HOME CARE INSTRUCTIONS    Maintain good oral hygiene. This is especially important for transplant patients.   Brush your teeth carefully with a soft, nylon-bristled toothbrush.   Floss at least 2 times a day.   Clean your mouth after eating.   Rinse your mouth with salt water 3 to 4 times a day.   Gargle with cold water.   Use topical numbing medicines to decrease pain if recommended by your caregiver.   Stop smoking, and stop using chewing or smokeless tobacco.   Avoid eating hot and spicy foods.   Eat soft and bland food.   Reduce your stress wherever possible.   Eat healthy and nutritious foods.  SEEK MEDICAL CARE IF:    Your symptoms persist or get worse.   You develop new symptoms.   Your mouth ulcers are present for more than 3 weeks.   Your mouth ulcers come back frequently.   You have increasing difficulty with normal eating and drinking.   You have increasing fatigue or weakness.   You develop loss of appetite or nausea.  SEEK IMMEDIATE MEDICAL CARE IF:    You have a fever.   You develop pain, redness, or sores around one or both eyes.   You cannot eat or drink because of  pain or other symptoms.   You develop worsening weakness, or you faint.   You develop vomiting or diarrhea.   You develop chest pain, shortness of breath, or rapid and irregular heartbeats.  MAKE SURE YOU:  Understand these instructions.   Will watch your condition.  Will get help right away if you are not doing well or get worse.  Document Released: 06/15/2007 Document Revised: 11/10/2011 Document Reviewed: 03/27/2011 [facility] Patient Information 2013 [place], [place].

## 2012-06-09 NOTE — Progress Notes (Signed)
Subjective:    Patient ID: Leah Jensen, female    DOB: 10-26-1945, 66 y.o.   MRN: [id number]  HPI Comments: 2 separate problems: 1) Tongue irritation with white coating. Onset 2 weeks ago. Precipitated by amoxicillin use (caused rash elsewhere on body). Did not respond to single Diflucan tablet as recommended by her brother who is out of town physician. 2) ulceration on roof of mouth, right side near molar line. Precipitated by burn from hot food. Associated with tenderness to touch including eating and swallowing. Onset one week ago, gradually worsening pain.  Mouth Lesions  The onset was gradual. The problem occurs continuously. The problem is moderate. Nothing relieves the symptoms. The symptoms are aggravated by eating and drinking. Associated symptoms include mouth sores. Pertinent negatives include no fever, no decreased vision, no double vision, no eye itching, no constipation, no diarrhea, no nausea, no vomiting, no congestion, no ear discharge, no ear pain, no headaches, no rhinorrhea, no sore throat, no stridor, no swollen glands, no muscle aches, no cough, no URI, no wheezing, no eye discharge, no eye pain and no eye redness. She has been eating and drinking normally. There were no sick contacts.   Past Medical History  Diagnosis Date  . ALLERGIC RHINITIS   . OSTEOARTHRITIS, KNEE, RIGHT   . DEPRESSION/ANXIETY   . TREMOR, ESSENTIAL   . Low back pain   . 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 Anxiety     Also reviewed chronic medical issues:  depression/anxiety - follows with psyc in [place] - reports compliance with ongoing medical treatment and no changes in medication dose or frequency. denies adverse side effects related to current therapy. hosp overnight 09/2010 for chest pain which was related to panic attack (after neg myoview stress testing and echo)     osteoarthritis , R knee. S/p synthetic viscus injections summer 2013  because of flare with overuse - previously taking glucosamine/chondroitin but now on organic juice with fair relief of daily pain symptoms - no current swelling, no popping or falls - no injury recalled   allergic rhinitis - reports compliance with ongoing medical treatment and no changes in medication dose or frequency. denies adverse side effects related to current therapy.     essential tremor - L hand> r hand and LE - uses propanolol to control same but dose limited by low blood pressure with titration of beta-blocker -    Osteoporosis - reviewed results of DEXA 10/2010> -4.5 spine - denies bone pain or new back pain - no fractures, no falls - takes Vit D and Ca daily   Past Medical History  Diagnosis Date  . ALLERGIC RHINITIS   . OSTEOARTHRITIS, KNEE, RIGHT   . DEPRESSION/ANXIETY   . TREMOR, ESSENTIAL   . Low back pain   . 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 Anxiety     Review of Systems  Constitutional: Negative for fever.  HENT: Positive for mouth sores. Negative for ear pain, congestion, sore throat, rhinorrhea and ear discharge.   Eyes: Negative for double vision, pain, discharge, redness and itching.  Respiratory: Negative for cough, wheezing and stridor.   Gastrointestinal: Negative for nausea, vomiting, diarrhea and constipation.  Neurological: Negative for headaches.       Objective:   Physical Exam  BP 100/76  Pulse 62  Temp 98.4 F (36.9 C) (Oral)  Ht 5' 3.5" (1.613 m)  Wt 137 lb (62.143 kg)  BMI 23.89 kg/m2  SpO2 98% Wt Readings from Last 3 Encounters:  06/09/12 137 lb (62.143 kg)  02/17/12 141 lb 12 oz (64.297 kg)  01/06/12 142 lb (64.411 kg)   Constitutional: She appears well-developed and well-nourished. No distress.  HENT: Actos ulceration right side hard palate near molar. Approximately 1 cm diameter. Shallow irritation without drainage. Mild stomatitis changes on tongue including lateral edges. Also  overlapping thrush, mild. No gum recession, dental caries or oropharynx exudate. Sinuses nontender to palpation. No periorbital swelling or edema Neck: Normal range of motion. Neck supple. No JVD present. No thyromegaly present.  Cardiovascular: Normal rate, regular rhythm and normal heart sounds.  No murmur heard. No BLE edema. Pulmonary/Chest: Effort normal and breath sounds normal. No respiratory distress. She has no wheezes.  Psychiatric: She has a normal mood and affect. Her behavior is normal. Judgment and thought content normal.   Lab Results  Component Value Date   WBC 11.8* 09/16/2010   HGB 14.3 09/16/2010   HCT 42.0 09/16/2010   PLT 289 09/16/2010   GLUCOSE 99 09/16/2010   CHOL 240* 09/29/2011   TRIG 147.0 09/29/2011   HDL 73.40 09/29/2011   LDLDIRECT 98.1 09/29/2011   NA 138 09/16/2010   K 3.6 09/16/2010   CL 103 09/16/2010   CREATININE 1.0 09/16/2010   BUN 15 09/16/2010      Assessment & Plan:   Oral candidiasis Aphthous ulcer Stomatitis, NOS  Will treat for fungal precipitant and pain control with Magic mouthwash/lidocaine 4 times a day Reassurance and education provided Patient to followup with her dentist if continued problems

## 2012-06-09 NOTE — Assessment & Plan Note (Signed)
Possible undertreated symptoms contributing to nasal congestion and subsequent dry mouth sensation Reassured no gum disease evident to me today, to followup with dentist if persisting concerns about dry mouth Recommended consideration of allergic sinusitis treatment, patient will consider and call if needed

## 2012-06-14 ENCOUNTER — Telehealth: Payer: Self-pay | Admitting: Internal Medicine

## 2012-06-14 NOTE — Telephone Encounter (Signed)
Caller: Jissell/Patient; Patient Name: Leah Jensen; PCP: Rene Paci (Adults only); Best Callback Phone Number: [phone] Seen is office last week for mouth sores and was prescribed Magic Mouthwash- take 5 mls PO four times daily as needed and she is not sure if she was supposed to swallow it and how long she needs to take it. She has swallowed 4 doses so far. She called her brother who is a physician and he recommended that she spit it out. Sores are seeming to be getting better. Confirmed that she is supposed to swish and spit and use until sores heal.  Refused Triage. Advised if treatment is not helping within a week then she should call back.

## 2012-08-27 ENCOUNTER — Other Ambulatory Visit: Payer: Self-pay | Admitting: *Deleted

## 2012-08-27 DIAGNOSIS — M810 Age-related osteoporosis without current pathological fracture: Secondary | ICD-10-CM

## 2012-08-27 MED ORDER — ALENDRONATE SODIUM 70 MG PO TABS: 70.0000 mg | ORAL_TABLET | ORAL | Status: DC

## 2012-09-27 ENCOUNTER — Ambulatory Visit: Payer: Medicare PPO | Admitting: Internal Medicine

## 2012-10-04 ENCOUNTER — Ambulatory Visit (INDEPENDENT_AMBULATORY_CARE_PROVIDER_SITE_OTHER): Payer: Medicare PPO | Admitting: Internal Medicine

## 2012-10-04 ENCOUNTER — Encounter: Payer: Self-pay | Admitting: Internal Medicine

## 2012-10-04 VITALS — BP 98/62 | HR 73 | Temp 97.8°F | Ht 63.5 in | Wt 134.4 lb

## 2012-10-04 DIAGNOSIS — L739 Follicular disorder, unspecified: Secondary | ICD-10-CM

## 2012-10-04 DIAGNOSIS — L738 Other specified follicular disorders: Secondary | ICD-10-CM

## 2012-10-04 DIAGNOSIS — M810 Age-related osteoporosis without current pathological fracture: Secondary | ICD-10-CM

## 2012-10-04 DIAGNOSIS — Z0000 Encounter for general adult medical examination without abnormal findings: Secondary | ICD-10-CM

## 2012-10-04 DIAGNOSIS — M1710 Unilateral primary osteoarthritis, unspecified knee: Secondary | ICD-10-CM

## 2012-10-04 LAB — HM PAP SMEAR

## 2012-10-04 MED ORDER — TRIAMCINOLONE ACETONIDE 0.025 % EX LOTN: 1.0000 "application " | TOPICAL_LOTION | Freq: Two times a day (BID) | CUTANEOUS | Status: DC | PRN

## 2012-10-04 NOTE — Progress Notes (Signed)
Subjective:    Patient ID: Leah Jensen, female    DOB: 1946/02/20, 67 y.o.   MRN: [id number]  HPI   Here for medicare wellness  Diet: heart healthy  Physical activity: active WB exercises Depression/mood screen: negative Hearing: intact to whispered voice Visual acuity: grossly normal, performs annual eye exam  ADLs: capable Fall risk: none Home safety: good Cognitive evaluation: intact to orientation, naming, recall and repetition EOL planning: adv directives, full code/ I agree  I have personally reviewed and have noted 1. The patient's medical and social history 2. Their use of alcohol, tobacco or illicit drugs 3. Their current medications and supplements 4. The patient's functional ability including ADL's, fall risks, home safety risks and hearing or visual impairment. 5. Diet and physical activities 6. Evidence for depression or mood disorders  Also reviewed chronic medical issues:  depression/anxiety - follows with psyc in [place] - reports compliance with ongoing medical treatment and no changes in medication dose or frequency. denies adverse side effects related to current therapy. hosp overnight 09/2010 for chest pain which was related to panic attack (after neg myoview stress testing and echo)     osteoarthritis , R knee - uses OTC supplements (organic tumeric) and meloxicam from ortho with fair relief of daily pain symptoms - no swelling, no popping or falls - no injury hx recalled   allergic rhinitis - reports compliance with ongoing medical treatment and no changes in medication dose or frequency. denies adverse side effects related to current therapy.     essential tremor - L > R hand and LE - uses propanolol to control same but dose limited by low blood pressure with titration of bbloc -    Osteoporosis - reviewed results of DEXA 10/2010> -4.5 spine, started fosamax weekly 09/2011 - denies bone pain or new back pain - no fractures, no falls - takes Vit D and Ca  daily   Past Medical History  Diagnosis Date  . ALLERGIC RHINITIS   . OSTEOARTHRITIS, KNEE, RIGHT   . DEPRESSION/ANXIETY   . TREMOR, ESSENTIAL   . Low back pain   . 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 Anxiety    Family History  Problem Relation Age of Onset  . Arthritis Mother   . Heart disease Mother   . Arthritis Father   . Heart disease Father     History  Substance Use Topics  . Smoking status: Never Smoker   . Smokeless tobacco: Not on file     Comment: Divorced, lives alone and single  . Alcohol Use: No    Review of Systems  Constitutional: Negative for fever or unexpected weight change.  Respiratory: Negative for cough and shortness of breath.   Cardiovascular: Negative for chest pain or palpitations.  Gastrointestinal: Negative for abdominal pain, no bowel changes.  Musculoskeletal: Negative for gait problem or joint swelling. working with ortho/PT on R shoulder and R knee pain Skin: Negative for rash.  Neurological: Negative for dizziness or headache.  No other specific complaints in a complete review of systems (except as listed in HPI above).     Objective:   Physical Exam  BP 98/62  Pulse 73  Temp 97.8 F (36.6 C) (Oral)  Ht 5' 3.5" (1.613 m)  Wt 134 lb 6.4 oz (60.963 kg)  BMI 23.43 kg/m2  SpO2 99% Wt Readings from Last 3 Encounters:  10/04/12 134 lb 6.4 oz (60.963 kg)  06/09/12 137 lb (  62.143 kg)  02/17/12 141 lb 12 oz (64.297 kg)   Constitutional: She appears well-developed and well-nourished. No distress.  Neck: Normal range of motion. Neck supple. No JVD present. No thyromegaly present.  Cardiovascular: Normal rate, regular rhythm and normal heart sounds.  No murmur heard. No BLE edema. Pulmonary/Chest: Effort normal and breath sounds normal. No respiratory distress. She has no wheezes.  Skin: folliculitis B anterior shins and mild across lower back with evidence of excoriation - no  cellulitis, ulceration or hives Psychiatric: She has a normal mood and affect. Her behavior is normal. Judgment and thought content normal.   Lab Results  Component Value Date   WBC 11.8* 09/16/2010   HGB 14.3 09/16/2010   HCT 42.0 09/16/2010   PLT 289 09/16/2010   GLUCOSE 99 09/16/2010   CHOL 240* 09/29/2011   TRIG 147.0 09/29/2011   HDL 73.40 09/29/2011   LDLDIRECT 98.1 09/29/2011   NA 138 09/16/2010   K 3.6 09/16/2010   CL 103 09/16/2010   CREATININE 1.0 09/16/2010   BUN 15 09/16/2010      Assessment & Plan:  AWV - v70.0 - Today patient counseled on age appropriate routine health concerns for screening and prevention, each reviewed and up to date or declined. Immunizations reviewed and up to date or declined. Labs ordered & reviewed. Risk factors for depression reviewed and negative. Hearing function and visual acuity are intact. ADLs screened and addressed as needed. Functional ability and level of safety reviewed and appropriate. Education, counseling and referrals performed based on assessed risks today. Patient provided with a copy of personalized plan for preventive services.  Also see problem list. Medications and labs reviewed today.  Folliculitis - superimposed on dry skin dermatitis on B shins and lower back - reports dry skin with intense itch and subsequent scratching - use triamcin lotion and follow up derm if unimproved or worse

## 2012-10-04 NOTE — Assessment & Plan Note (Signed)
Follows with orthopedist Dr. Farris Has for same Synthetic viscous solution injection series summer 2013 after overuse flare  Symptoms controlled and exam stable today Continue , rx nsaids, organic and over-the-counter supplementation for pain symptoms as ongoing

## 2012-10-04 NOTE — Assessment & Plan Note (Signed)
DEXA 10/2010 reviewed - on Ca + Vit D approp and WB exercise Started bisphos weekly 09/2011 Recheck DEXA 10/2012 to monitor

## 2012-10-04 NOTE — Patient Instructions (Signed)
It was good to see you today. We have reviewed your prior records including labs and tests today Health Maintenance reviewed - all recommended immunizations and age-appropriate screenings are up-to-date. Medications reviewed and updated, no changes at this time. we'll make referral to [facility] for repeat bone density scan in April 2014. Our office will contact you regarding appointment(s) once made. Use triamcinolone lotion for itch and rash on back and legs as needed, followup with dermatology if worse or unimproved Please schedule followup annually, call sooner if problems.

## 2012-11-03 ENCOUNTER — Ambulatory Visit: Payer: Medicare HMO | Admitting: Obstetrics and Gynecology

## 2012-11-03 ENCOUNTER — Encounter: Payer: Self-pay | Admitting: Obstetrics and Gynecology

## 2012-11-03 VITALS — BP 92/58 | Ht 63.5 in | Wt 139.0 lb

## 2012-11-03 DIAGNOSIS — Z202 Contact with and (suspected) exposure to infections with a predominantly sexual mode of transmission: Secondary | ICD-10-CM

## 2012-11-03 DIAGNOSIS — N951 Menopausal and female climacteric states: Secondary | ICD-10-CM

## 2012-11-03 NOTE — Progress Notes (Signed)
Annual Exam:  Last Pap: 10/09/2011 WNL: Yes.  No hx of high grade abnormalities Regular Periods:no menopausal Contraception: none  Monthly Breast exam:yes Tetanus<30yrs:yes Nl.Bladder Function:yes Daily BMs:yes Healthy Diet:yes Calcium:yes Mammogram:no Date of Mammogram: DUE 12/2012 Exercise:yes Have often Exercise: pt walks everyday. Seatbelt: yes Abuse at home: no Stressful work:no Sigmoid-colonoscopy: 2013 "WNL" Bone Density: YES 2 years ago through PCP osteoporosis ; Scheduled 11-16-12 PCP: Rene Paci Change in PMH: unchangedDr. Ignacia Palma Psych, Dr. Brynda Rim    Has never had Hep C testing Change in ZOX:[id number]  Subjective:    Leah Jensen is a 67 y.o. female G2P2 who presents for annual exam.  The patient has no complaints today. She notes good control of vasomotor sx with topical Biest/progesterone combination.  She has tried discontinuing it but had such severe insomnia that her psychiatrist recommended her restarting it. Walks daily and takes calcium and vit D as well as Fosamax prescribed by PCP for osteoporosis.  The following portions of the patient's history were reviewed and updated as appropriate: allergies, current medications, past family history, past medical history, past social history, past surgical history and problem list.  Review of Systems Pertinent items are noted in HPI. Gastrointestinal:No change in bowel habits, no abdominal pain, no rectal bleeding Genitourinary:negative for dysuria, frequency, hematuria, nocturia and urinary incontinence    Objective:     BP 92/58  Ht 5' 3.5" (1.613 m)  Wt 139 lb (63.05 kg)  BMI 24.23 kg/m2   Wt Readings from Last 1 Encounters:  11/03/12 139 lb (63.05 kg)     BMI: Body mass index is 24.23 kg/(m^2). General Appearance: Alert, appropriate appearance for age. No acute distress HEENT: Grossly normal Neck / Thyroid: Supple, no masses, nodes or enlargement Lungs: clear to auscultation  bilaterally Back: No CVA tenderness Breast Exam: No masses or nodes.No dimpling, nipple retraction or discharge. Well healed incisions right breast s/p breast bx Cardiovascular: Regular rate and rhythm. S1, S2, no murmur Gastrointestinal: Soft, non-tender, no masses or organomegaly Pelvic Exam: External genitalia: normal general appearance Vaginal: atrophic mucosa Cervix: atrophic with contact bleeding Adnexa: no masses noted Uterus: normal single, nontender Rectovaginal: normal rectal, no masses Lymphatic Exam: Non-palpable nodes in neck, clavicular, axillary, or inguinal regions Skin: no rash or abnormalities Neurologic: Normal gait and speech, no tremor  Psychiatric: Alert and oriented, appropriate affect.    Urinalysis:Not done    Assessment:    Menopause  with nightime vasomotor sx controlledby topical compounded HRT  Depression well managed on current regimen as long as she is able to sleep Candidate for CDC recommended HepC testing Osteoporosis on Fosamax managed by PCP Plan:  HepC testing mammogram pap smear with HR HPV since no hx of high grade changes Continue topical HRT return annually or prn

## 2012-11-04 LAB — HEPATITIS C ANTIBODY: HCV Ab: REACTIVE — AB

## 2012-11-05 LAB — PAP IG AND HPV HIGH-RISK: HPV DNA High Risk: NOT DETECTED

## 2012-11-11 ENCOUNTER — Ambulatory Visit (HOSPITAL_COMMUNITY)
Admission: RE | Admit: 2012-11-11 | Discharge: 2012-11-11 | Disposition: A | Discharge status: Home / Self Care | Payer: Medicare PPO | Source: Ambulatory Visit | Attending: Internal Medicine | Admitting: Internal Medicine

## 2012-11-11 DIAGNOSIS — Z780 Asymptomatic menopausal state: Secondary | ICD-10-CM | POA: Insufficient documentation

## 2012-11-11 DIAGNOSIS — Z13820 Encounter for screening for osteoporosis: Secondary | ICD-10-CM | POA: Insufficient documentation

## 2012-11-11 DIAGNOSIS — M810 Age-related osteoporosis without current pathological fracture: Secondary | ICD-10-CM

## 2012-11-11 DIAGNOSIS — M899 Disorder of bone, unspecified: Secondary | ICD-10-CM | POA: Insufficient documentation

## 2012-11-18 ENCOUNTER — Telehealth: Payer: Self-pay | Admitting: Internal Medicine

## 2012-11-18 ENCOUNTER — Encounter: Payer: Self-pay | Admitting: Internal Medicine

## 2012-11-18 NOTE — Telephone Encounter (Signed)
Called pt no answew LMOM md response

## 2012-11-18 NOTE — Telephone Encounter (Signed)
Please call pt - her DEXA shows small improvement in her bone density: prev -4.5, now -4.2 (less negative is better) Continue tx as ongoing - fosamax, Ca+Vit D, no changes  thanks

## 2012-12-07 ENCOUNTER — Telehealth: Payer: Self-pay | Admitting: Internal Medicine

## 2012-12-07 NOTE — Telephone Encounter (Signed)
Tried calling [employer] was on hold for 15 plus mins. Will try to call bck later...lmb

## 2012-12-07 NOTE — Telephone Encounter (Signed)
Caller: Leah Jensen/Patient; Phone: [phone]; Reason for Call: Patient's insurance company [facility]) does not have the correct number on her card for this office.  She has attempted to give them the correct number, but they told the patient that someone from the office has to call and give the correct phone number.  You may call [employer] at [phone] to give the correct phone number.

## 2012-12-08 NOTE — Telephone Encounter (Signed)
Notified [employer] spoke with rep Josh gave him correct phone number for Dr Felicity Coyer. Gave ref # [id number]. Pt was notified that we called,,,,lmb

## 2012-12-20 ENCOUNTER — Telehealth: Payer: Self-pay | Admitting: Internal Medicine

## 2012-12-20 NOTE — Telephone Encounter (Signed)
Called pt no answer LMOM RTC.../lmb 

## 2012-12-20 NOTE — Telephone Encounter (Signed)
Patient wants to speak with assistant about changing one of her medications

## 2012-12-21 NOTE — Telephone Encounter (Signed)
Called pt again still no answer LMOM RTC.../lmb 

## 2012-12-22 NOTE — Telephone Encounter (Signed)
Tried calling pt back several time no answer & pt hasn't return call back. Closing phone note...Raechel Chute

## 2013-01-17 ENCOUNTER — Other Ambulatory Visit: Payer: Self-pay | Admitting: *Deleted

## 2013-01-17 DIAGNOSIS — M810 Age-related osteoporosis without current pathological fracture: Secondary | ICD-10-CM

## 2013-01-17 MED ORDER — ALENDRONATE SODIUM 70 MG PO TABS: 70.0000 mg | ORAL_TABLET | ORAL | Status: DC

## 2013-01-25 ENCOUNTER — Telehealth: Payer: Self-pay | Admitting: *Deleted

## 2013-01-25 DIAGNOSIS — Z0000 Encounter for general adult medical examination without abnormal findings: Secondary | ICD-10-CM

## 2013-01-25 NOTE — Telephone Encounter (Signed)
Pt states she had her physical back in Jan or Feb. Did not have any labs done. Psychiatrist is wanting copy of results. Inform pt md out of office will call bck tomorrow to let her know about labs. Did verify no labs was done back in feb...Raechel Chute

## 2013-01-26 NOTE — Telephone Encounter (Signed)
Called pt no answer LMOM labs order in comp,,,lmb

## 2013-01-26 NOTE — Telephone Encounter (Signed)
Standard labs for CPX entered - can send copy to psyc once back - will need name and contact number for her provider - thanks

## 2013-01-27 ENCOUNTER — Other Ambulatory Visit (INDEPENDENT_AMBULATORY_CARE_PROVIDER_SITE_OTHER): Payer: Medicare PPO

## 2013-01-27 DIAGNOSIS — Z0000 Encounter for general adult medical examination without abnormal findings: Secondary | ICD-10-CM

## 2013-01-27 DIAGNOSIS — Z136 Encounter for screening for cardiovascular disorders: Secondary | ICD-10-CM

## 2013-01-27 LAB — CBC WITH DIFFERENTIAL/PLATELET
Basophils Relative: 0.9 % (ref 0.0–3.0)
Hemoglobin: 14.1 g/dL (ref 12.0–15.0)
Lymphocytes Relative: 30.7 % (ref 12.0–46.0)
Monocytes Relative: 10 % (ref 3.0–12.0)
Neutro Abs: 3 10*3/uL (ref 1.4–7.7)
RBC: 4.54 Mil/uL (ref 3.87–5.11)
WBC: 5.4 10*3/uL (ref 4.5–10.5)

## 2013-01-27 LAB — BASIC METABOLIC PANEL
Calcium: 9.5 mg/dL (ref 8.4–10.5)
GFR: 72.76 mL/min (ref >60.00)
Sodium: 139 mEq/L (ref 135–145)

## 2013-01-27 LAB — HEPATIC FUNCTION PANEL
ALT: 21 U/L (ref 0–35)
AST: 23 U/L (ref 0–37)
Albumin: 3.8 g/dL (ref 3.5–5.2)
Total Protein: 6.6 g/dL (ref 6.0–8.3)

## 2013-01-27 LAB — LIPID PANEL
Cholesterol: 219 mg/dL — ABNORMAL HIGH (ref 0–200)
Total CHOL/HDL Ratio: 3
Triglycerides: 99 mg/dL (ref 0.0–149.0)

## 2013-03-01 ENCOUNTER — Other Ambulatory Visit: Payer: Self-pay | Admitting: Obstetrics and Gynecology

## 2013-03-01 DIAGNOSIS — Z1231 Encounter for screening mammogram for malignant neoplasm of breast: Secondary | ICD-10-CM

## 2013-03-19 ENCOUNTER — Emergency Department (HOSPITAL_COMMUNITY): Payer: Medicare PPO

## 2013-03-19 ENCOUNTER — Encounter (HOSPITAL_COMMUNITY): Payer: Self-pay

## 2013-03-19 ENCOUNTER — Emergency Department (HOSPITAL_COMMUNITY)
Admission: EM | Admit: 2013-03-19 | Discharge: 2013-03-19 | Disposition: A | Discharge status: Home / Self Care | Payer: Medicare PPO | Attending: Emergency Medicine | Admitting: Emergency Medicine

## 2013-03-19 DIAGNOSIS — W1809XA Striking against other object with subsequent fall, initial encounter: Secondary | ICD-10-CM | POA: Insufficient documentation

## 2013-03-19 DIAGNOSIS — Z9889 Other specified postprocedural states: Secondary | ICD-10-CM | POA: Insufficient documentation

## 2013-03-19 DIAGNOSIS — Z8669 Personal history of other diseases of the nervous system and sense organs: Secondary | ICD-10-CM | POA: Insufficient documentation

## 2013-03-19 DIAGNOSIS — Z880 Allergy status to penicillin: Secondary | ICD-10-CM | POA: Insufficient documentation

## 2013-03-19 DIAGNOSIS — G250 Essential tremor: Secondary | ICD-10-CM | POA: Insufficient documentation

## 2013-03-19 DIAGNOSIS — S20212A Contusion of left front wall of thorax, initial encounter: Secondary | ICD-10-CM

## 2013-03-19 DIAGNOSIS — Y990 Civilian activity done for income or pay: Secondary | ICD-10-CM | POA: Insufficient documentation

## 2013-03-19 DIAGNOSIS — M1710 Unilateral primary osteoarthritis, unspecified knee: Secondary | ICD-10-CM | POA: Insufficient documentation

## 2013-03-19 DIAGNOSIS — Z79899 Other long term (current) drug therapy: Secondary | ICD-10-CM | POA: Insufficient documentation

## 2013-03-19 DIAGNOSIS — Y9289 Other specified places as the place of occurrence of the external cause: Secondary | ICD-10-CM | POA: Insufficient documentation

## 2013-03-19 DIAGNOSIS — F341 Dysthymic disorder: Secondary | ICD-10-CM | POA: Insufficient documentation

## 2013-03-19 DIAGNOSIS — Z8709 Personal history of other diseases of the respiratory system: Secondary | ICD-10-CM | POA: Insufficient documentation

## 2013-03-19 DIAGNOSIS — S20219A Contusion of unspecified front wall of thorax, initial encounter: Secondary | ICD-10-CM | POA: Insufficient documentation

## 2013-03-19 DIAGNOSIS — IMO0002 Reserved for concepts with insufficient information to code with codable children: Secondary | ICD-10-CM | POA: Insufficient documentation

## 2013-03-19 DIAGNOSIS — Y9389 Activity, other specified: Secondary | ICD-10-CM | POA: Insufficient documentation

## 2013-03-19 DIAGNOSIS — M810 Age-related osteoporosis without current pathological fracture: Secondary | ICD-10-CM | POA: Insufficient documentation

## 2013-03-19 MED ORDER — HYDROCODONE-ACETAMINOPHEN 5-325 MG PO TABS: ORAL_TABLET | ORAL | Status: DC

## 2013-03-19 NOTE — ED Notes (Signed)
Patient at work slid across floor and hit her chest. Patient states that she just wants to know if she broke her ribs.  Hurts when she laughs, deep breathes and moves in certain positions

## 2013-03-19 NOTE — ED Notes (Addendum)
Patient states that it only hurts when she takes a deep breath. It is a sharp pain that can take her breath away. Patient has swelling noted in her left back. It is soft.

## 2013-03-19 NOTE — ED Provider Notes (Signed)
History    CSN: [id number] Arrival date & time 03/19/13  1026  First MD Initiated Contact with Patient 03/19/13 1034     Chief Complaint  Patient presents with  . Chest Pain   (Consider location/radiation/quality/duration/timing/severity/associated sxs/prior Treatment) Patient is a 67 y.o. female presenting with chest pain. The history is provided by the patient (pt fell on her chest.  pt has left chest pain).  Chest Pain Pain location:  L chest Pain radiates to:  Does not radiate Pain radiates to the back: no   Pain severity:  Moderate Onset quality: started when she fell. Timing:  Constant Associated symptoms: no abdominal pain, no back pain, no cough, no fatigue and no headache    Past Medical History  Diagnosis Date  . ALLERGIC RHINITIS   . OSTEOARTHRITIS, KNEE, RIGHT   . DEPRESSION/ANXIETY   . TREMOR, ESSENTIAL   . Low back pain   . 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 Anxiety    Past Surgical History  Procedure Laterality Date  . Breast surgery  1990    Breast biopsy  . Child birth      x's 2 (55 & 19)   Family History  Problem Relation Age of Onset  . Arthritis Mother   . Heart disease Mother   . Lung disease Mother   . Arthritis Father   . Heart disease Father   . Breast cancer Paternal Aunt    History  Substance Use Topics  . Smoking status: Never Smoker   . Smokeless tobacco: Never Used     Comment: Divorced, lives alone and single  . Alcohol Use: No   OB History   Grav Para Term Preterm Abortions TAB SAB Ect Mult Living   2 2             Review of Systems  Constitutional: Negative for appetite change and fatigue.  HENT: Negative for congestion, sinus pressure and ear discharge.   Eyes: Negative for discharge.  Respiratory: Negative for cough.   Cardiovascular: Positive for chest pain.  Gastrointestinal: Negative for abdominal pain and diarrhea.  Genitourinary: Negative for frequency and  hematuria.  Musculoskeletal: Negative for back pain.  Skin: Negative for rash.  Neurological: Negative for seizures and headaches.  Psychiatric/Behavioral: Negative for hallucinations.    Allergies  Amoxicillin  Home Medications   Current Outpatient Rx  Name  Route  Sig  Dispense  Refill  . alendronate (FOSAMAX) 70 MG tablet   Oral   Take 70 mg by mouth every 7 (seven) days. On mondays Take with a full glass of water on an empty stomach.         [name] b complex vitamins tablet   Oral   Take 1 tablet by mouth every morning.          . calcium citrate (CALCITRATE - DOSED IN MG ELEMENTAL CALCIUM) 950 MG tablet   Oral   Take 1 tablet by mouth 2 (two) times daily.         . Cholecalciferol (VITAMIN D3) 1000 UNITS CAPS   Oral   Take 1,000 Units by mouth 2 (two) times daily.          . clonazePAM (KLONOPIN) 0.5 MG tablet   Oral   Take 0.5 mg by mouth 3 (three) times daily as needed for anxiety. Take 1/2 tab twice daily if needed and 1 tab at bedtime as needed   30 tablet      .  desvenlafaxine (PRISTIQ) 50 MG 24 hr tablet   Oral   Take 50 mg by mouth at bedtime.          . Lactobacillus (ACIDOPHILUS/BIFIDUS PO)   Oral   Take 1 capsule by mouth 2 (two) times daily.          [name] lamoTRIgine (LAMICTAL) 25 MG tablet   Oral   Take 25 mg by mouth at bedtime.          . Multiple Vitamin (MULTIVITAMIN WITH MINERALS) TABS   Oral   Take 1 tablet by mouth every morning.         . NON FORMULARY   Topical   Apply 1.5 mg topically at bedtime. Biest 1.5 mg Prog 15% cream use as directed         . Omega-3 Fatty Acids (FISH OIL) 1000 MG CAPS   Oral   Take 1 capsule by mouth at bedtime.          [name] OVER THE COUNTER MEDICATION   Oral   Take 1 tablet by mouth at bedtime. Tumeric mg unknown         . propranolol (INDERAL) 20 MG tablet   Oral   Take 10 mg by mouth 3 (three) times daily as needed (for anxiety).          . propranolol ER (INDERAL LA) 60 MG 24 hr  capsule   Oral   Take 60 mg by mouth at bedtime.         . vitamin B-12 (CYANOCOBALAMIN) 1000 MCG tablet   Oral   Take 1,000 mcg by mouth every morning.          . vitamin C (ASCORBIC ACID) 500 MG tablet   Oral   Take 500 mg by mouth every morning.          [name] HYDROcodone-acetaminophen (NORCO/VICODIN) 5-325 MG per tablet      Take one every 6 hours for pain not helped by motrin or ibuprophen   20 tablet   0    BP 116/66  Pulse 55  Temp(Src) 98.7 F (37.1 C) (Oral)  Resp 20  SpO2 100% Physical Exam  Constitutional: She is oriented to person, place, and time. She appears well-developed.  HENT:  Head: Normocephalic.  Eyes: Conjunctivae and EOM are normal. No scleral icterus.  Neck: Neck supple. No thyromegaly present.  Cardiovascular: Normal rate and regular rhythm.  Exam reveals no gallop and no friction rub.   No murmur heard. Pulmonary/Chest: No stridor. She has no wheezes. She has no rales. She exhibits tenderness.  Tender left ant chest  Abdominal: She exhibits no distension. There is no tenderness. There is no rebound.  Musculoskeletal: Normal range of motion. She exhibits no edema.  Lymphadenopathy:    She has no cervical adenopathy.  Neurological: She is oriented to person, place, and time. Coordination normal.  Skin: No rash noted. No erythema.  Psychiatric: She has a normal mood and affect. Her behavior is normal.    ED Course  Procedures (including critical care time) Labs Reviewed - No data to display Dg Ribs Unilateral W/chest Left  03/19/2013   *RADIOLOGY REPORT*  Clinical Data: Fall, left chest pain  LEFT RIBS AND CHEST - 3+ VIEW  Comparison: Chest radiograph dated 09/16/2010  Findings: Lungs are essentially clear.  No focal consolidation. No pleural effusion or pneumothorax.  The heart is normal in size.  No displaced left rib fracture is seen.  IMPRESSION: No evidence of  acute cardiopulmonary disease.  No displaced left rib fracture is seen.    Original Report Authenticated By: Charline Bills, M.D.   1. Chest wall contusion, left, initial encounter     Date: 03/19/2013  Rate: 67  Rhythm: normal sinus rhythm  QRS Axis: normal  Intervals: normal  ST/T Wave abnormalities: normal  Conduction Disutrbances:none  Narrative Interpretation:   Old EKG Reviewed: unchanged   MDM    Benny Lennert, MD 03/19/13 1216

## 2013-03-22 ENCOUNTER — Encounter: Payer: Self-pay | Admitting: Internal Medicine

## 2013-03-22 ENCOUNTER — Ambulatory Visit (INDEPENDENT_AMBULATORY_CARE_PROVIDER_SITE_OTHER): Payer: Medicare PPO | Admitting: Internal Medicine

## 2013-03-22 VITALS — BP 110/72 | HR 55 | Temp 97.9°F | Wt 147.1 lb

## 2013-03-22 DIAGNOSIS — W19XXXD Unspecified fall, subsequent encounter: Secondary | ICD-10-CM

## 2013-03-22 DIAGNOSIS — D1779 Benign lipomatous neoplasm of other sites: Secondary | ICD-10-CM

## 2013-03-22 DIAGNOSIS — M810 Age-related osteoporosis without current pathological fracture: Secondary | ICD-10-CM

## 2013-03-22 DIAGNOSIS — F341 Dysthymic disorder: Secondary | ICD-10-CM

## 2013-03-22 DIAGNOSIS — W19XXXA Unspecified fall, initial encounter: Secondary | ICD-10-CM

## 2013-03-22 DIAGNOSIS — D171 Benign lipomatous neoplasm of skin and subcutaneous tissue of trunk: Secondary | ICD-10-CM

## 2013-03-22 MED ORDER — DENOSUMAB 60 MG/ML ~~LOC~~ SOLN: 60.0000 mg | SUBCUTANEOUS | Status: DC

## 2013-03-22 NOTE — Assessment & Plan Note (Signed)
DEXA 10/2010 reviewed, min improved on 10/2012 follow up (-3.6, prev -4.5) - on Ca + Vit D approp and WB exercise Started bisphos weekly 09/2011 - compliance verified Change to Prolia now Recheck DEXA 10/2014 to monitor

## 2013-03-22 NOTE — Assessment & Plan Note (Signed)
Follows with psyc for same - on Lithium and SNRI No acute flare of symptoms, support provided Continue followup with behavioral health provider as ongoing, patient agrees to call sooner if symptoms worse or unimproved

## 2013-03-22 NOTE — Progress Notes (Signed)
  Subjective:    Patient ID: Leah Jensen, female    DOB: 1946/04/28, 67 y.o.   MRN: [id number]  HPI  Here for ER follow up - seen 03/19/13 ?lipoma treatment - Also ?other tx osteoporosis  Past Medical History  Diagnosis Date  . ALLERGIC RHINITIS   . OSTEOARTHRITIS, KNEE, RIGHT   . DEPRESSION/ANXIETY   . TREMOR, ESSENTIAL   . Low back pain   . 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 Anxiety     Review of Systems  Constitutional: Positive for fatigue. Negative for fever and unexpected weight change.  Respiratory: Negative for cough and shortness of breath.   Musculoskeletal: Negative for myalgias, back pain and joint swelling.       Objective:   Physical Exam  BP 110/72  Pulse 55  Temp(Src) 97.9 F (36.6 C) (Oral)  Wt 147 lb 1.9 oz (66.733 kg)  BMI 25.65 kg/m2  SpO2 99% Wt Readings from Last 3 Encounters:  03/22/13 147 lb 1.9 oz (66.733 kg)  11/03/12 139 lb (63.05 kg)  10/04/12 134 lb 6.4 oz (60.963 kg)   Constitutional: She appears well-developed and well-nourished. No distress.   Cardiovascular: Normal rate, regular rhythm and normal heart sounds.  No murmur heard. No BLE edema. Pulmonary/Chest: Effort normal and breath sounds normal. No respiratory distress. She has no wheezes.  Musculoskeletal: nontender rib cage - no gross deformity - ankle with FROM, no soft tissue swelling. Normal range of motion, no joint effusions. No gross deformities Skin: Lipoma over L scapula 2x2.4cm -non tender - No rash noted. No erythema.  Psychiatric: She has a normal mood and affect. Her behavior is normal. Judgment and thought content normal.   Lab Results  Component Value Date   WBC 5.4 01/27/2013   HGB 14.1 01/27/2013   HCT 40.8 01/27/2013   PLT 220.0 01/27/2013   GLUCOSE 89 01/27/2013   CHOL 219* 01/27/2013   TRIG 99.0 01/27/2013   HDL 82.10 01/27/2013   LDLDIRECT 96.1 01/27/2013   ALT 21 01/27/2013   AST 23 01/27/2013   NA 139  01/27/2013   K 4.3 01/27/2013   CL 106 01/27/2013   CREATININE 0.8 01/27/2013   BUN 12 01/27/2013   CO2 27 01/27/2013   TSH 2.35 01/27/2013         Assessment & Plan:   Lipoma - education and reassurance provided - no other intervention needed but pt will notify us if problematic (bra line irritation, etc) for refer to gen surg if needed in future  Fall 03/19/13- ER records reviewed - no fx on xray - reassurance and supportive care provided

## 2013-03-22 NOTE — Patient Instructions (Addendum)
It was good to see you today. We have reviewed your prior records including labs and tests today Medications reviewed and updated, will change weekly Fosamax to Prolia injection here every 6 months -no changes recommended at this time. We will call you when we get approval for these injections to arrange the nurse visit to administer same Continue working with your other specialists as ongoing - call if problems before your next visit  Lipoma A lipoma is a noncancerous (benign) tumor composed of fat cells. They are usually found under the skin (subcutaneous). A lipoma may occur in any tissue of the body that contains fat. Common areas for lipomas to appear include the back, shoulders, buttocks, and thighs. Lipomas are a very common soft tissue growth. They are soft and grow slowly. Most problems caused by a lipoma depend on where it is growing. DIAGNOSIS  A lipoma can be diagnosed with a physical exam. These tumors rarely become cancerous, but radiographic studies can help determine this for certain. Studies used may include:  Computerized X-ray scans (CT or CAT scan).  Computerized magnetic scans (MRI). TREATMENT  Small lipomas that are not causing problems may be watched. If a lipoma continues to enlarge or causes problems, removal is often the best treatment. Lipomas can also be removed to improve appearance. Surgery is done to remove the fatty cells and the surrounding capsule. Most often, this is done with medicine that numbs the area (local anesthetic). The removed tissue is examined under a microscope to make sure it is not cancerous. Keep all follow-up appointments with your caregiver. SEEK MEDICAL CARE IF:   The lipoma becomes larger or hard.  The lipoma becomes painful, red, or increasingly swollen. These could be signs of infection or a more serious condition. Document Released: 08/08/2002 Document Revised: 11/10/2011 Document Reviewed: 01/18/2010 [facility] Patient Information 2014  [place], [place].

## 2013-03-24 ENCOUNTER — Telehealth: Payer: Self-pay | Admitting: *Deleted

## 2013-03-24 ENCOUNTER — Ambulatory Visit (HOSPITAL_COMMUNITY): Payer: Medicare PPO

## 2013-03-24 NOTE — Telephone Encounter (Signed)
Called pt insurance # that was provided below. Spoke with Terrence/rep gave clinical information pertaining to prolia injection. Medication is requiring prior authorization. Faxing over PA. Ref # [id number]...lmb

## 2013-03-24 NOTE — Telephone Encounter (Signed)
Message copied by Deatra James on Thu Mar 24, 2013  4:35 PM ------      Message from: Clover Mealy      Created: Thu Mar 24, 2013  8:47 AM      Regarding: RE: [place],            Mrs. Pavlovich's plan [facility]) is requiring that we call them @ [phone] w/the following info for prior approval:   Dx (733.01);  sex of pt (F); any previous failed bisphosphonate & advise if there is a risk of osteoporotic fracture.  Documented contradiction to oral bisphosphonate therapy or significant bone mineral density on follow-up scans after 12-24 monhs of compliant bisphosphonate therapy.               They stated on the papers from Prolia that once this info is provided, processing time is 5 days.            Thanks      Ruby      ----- Message -----         From: Deatra James, MA         Sent: 03/22/2013  10:28 AM           To: Crawford Givens McClinton      Subject: Everardo All Mrs. Ruby            Here is another one that is wanting to get started getting the prolia inj!            Thanks Dominika Losey       ------

## 2013-03-25 NOTE — Telephone Encounter (Signed)
Finally received Pa for for prolia. Form has been fax back waiting on approval status...lmb

## 2013-03-28 ENCOUNTER — Telehealth: Payer: Self-pay | Admitting: *Deleted

## 2013-03-28 NOTE — Telephone Encounter (Signed)
Received PA back med has been approve following dates 03/25/13 to 03/25/14. Authorization # [id number]. Notified pt with appt...lmb

## 2013-03-28 NOTE — Telephone Encounter (Signed)
Called pt to inform her we received PA back on her prolia. Insurance has approved so we are trying to get appt set=up. No answer LMOM RTC...lmb

## 2013-03-28 NOTE — Telephone Encounter (Signed)
Pt return call back gave her status on the prolia. Pt made appt for next Thurs 04/07/13...lmb

## 2013-03-29 ENCOUNTER — Ambulatory Visit (INDEPENDENT_AMBULATORY_CARE_PROVIDER_SITE_OTHER): Payer: Medicare PPO | Admitting: Internal Medicine

## 2013-03-29 ENCOUNTER — Encounter: Payer: Self-pay | Admitting: Internal Medicine

## 2013-03-29 ENCOUNTER — Ambulatory Visit (HOSPITAL_COMMUNITY): Payer: Medicare PPO

## 2013-03-29 ENCOUNTER — Ambulatory Visit (INDEPENDENT_AMBULATORY_CARE_PROVIDER_SITE_OTHER)
Admission: RE | Admit: 2013-03-29 | Discharge: 2013-03-29 | Disposition: A | Discharge status: Home / Self Care | Payer: Medicare PPO | Source: Ambulatory Visit | Attending: Internal Medicine | Admitting: Internal Medicine

## 2013-03-29 VITALS — BP 102/72 | HR 63 | Temp 97.9°F | Wt 145.8 lb

## 2013-03-29 DIAGNOSIS — R0781 Pleurodynia: Secondary | ICD-10-CM

## 2013-03-29 DIAGNOSIS — R079 Chest pain, unspecified: Secondary | ICD-10-CM

## 2013-03-29 DIAGNOSIS — M810 Age-related osteoporosis without current pathological fracture: Secondary | ICD-10-CM

## 2013-03-29 DIAGNOSIS — M25532 Pain in left wrist: Secondary | ICD-10-CM

## 2013-03-29 DIAGNOSIS — S63502S Unspecified sprain of left wrist, sequela: Secondary | ICD-10-CM

## 2013-03-29 DIAGNOSIS — M25539 Pain in unspecified wrist: Secondary | ICD-10-CM

## 2013-03-29 DIAGNOSIS — IMO0002 Reserved for concepts with insufficient information to code with codable children: Secondary | ICD-10-CM

## 2013-03-29 MED ORDER — IBUPROFEN 600 MG PO TABS: 600.0000 mg | ORAL_TABLET | Freq: Three times a day (TID) | ORAL | Status: DC

## 2013-03-29 NOTE — Patient Instructions (Addendum)
It was good to see you today. We have reviewed your prior records including labs and tests today Test(s) ordered today. Your results will be released to MyChart (or called to you) after review, usually within 72hours after test completion. If any changes need to be made, you will be notified at that same time. Prescription ibuprofen 3 times daily with food for next week, then as needed for pain - Your prescription(s) have been submitted to your pharmacy. Please take as directed and contact our office if you believe you are having problem(s) with the medication(s). Wear wrist splint on left side to rest until pain improved over next 2 weeks, then as neededWrist Pain A wrist sprain happens when the bands of tissue that hold the wrist joints together (ligament) stretch too much or tear. A wrist strain happens when muscles or bands of tissue that connect muscles to bones (tendons) are stretched or pulled. HOME CARE  Put ice on the injured area.  Put ice in a plastic bag.  Place a towel between your skin and the bag.  Leave the ice on for 15-20 minutes, 3-4 times a day, for the first 2 days.  Raise (elevate) the injured wrist to lessen puffiness (swelling).  Rest the injured wrist for at least 48 hours or as told by your doctor.  Wear a splint, cast, or an elastic wrap as told by your doctor.  Only take medicine as told by your doctor.  Follow up with your doctor as told. This is important. GET HELP RIGHT AWAY IF:   The fingers are puffy, very red, white, or cold and blue.  The fingers lose feeling (numb) or tingle.  The pain gets worse.  It is hard to move the fingers. MAKE SURE YOU:   Understand these instructions.  Will watch your condition.  Will get help right away if you are not doing well or get worse. Document Released: 02/04/2008 Document Revised: 11/10/2011 Document Reviewed: 10/09/2010 [facility] Patient Information 2014 [place], [place].

## 2013-03-29 NOTE — Progress Notes (Signed)
Subjective:    Patient ID: Leah Jensen, female    DOB: March 11, 1946, 67 y.o.   MRN: [id number]  HPI Here for follow up re: fall 7/18 - seen in ER for same, also here last week 7/22 Increasing L breast/chest wall pain Also wrist pain No swelling, bruising or rash  Past Medical History  Diagnosis Date  . ALLERGIC RHINITIS   . OSTEOARTHRITIS, KNEE, RIGHT   . DEPRESSION/ANXIETY   . TREMOR, ESSENTIAL   . Low back pain   . 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 Anxiety      Review of Systems  Constitutional: Negative for fever and fatigue.       Objective:   Physical Exam BP 102/72  Pulse 63  Temp(Src) 97.9 F (36.6 C) (Oral)  Wt 145 lb 12.8 oz (66.134 kg)  BMI 25.42 kg/m2  SpO2 99% Wt Readings from Last 3 Encounters:  03/29/13 145 lb 12.8 oz (66.134 kg)  03/22/13 147 lb 1.9 oz (66.733 kg)  11/03/12 139 lb (63.05 kg)   Constitutional: She appears well-developed and well-nourished. No distress.  Neck: Normal range of motion. Neck supple. No JVD present. No thyromegaly present.  Cardiovascular: Normal rate, regular rhythm and normal heart sounds.  No murmur heard. No BLE edema. Pulmonary/Chest: Effort normal and breath sounds normal. No respiratory distress. She has no wheezes.  Musculoskeletal: tenderness to palpation over left lateral rib cage between mid axillary line and breast line No definitive gross deformity. Left wrist with mild swelling over radial side compared to right. Pain with ulnar deviation of the wrist. Full range of motion active with flexion, extension and supination/pronation. Thumb extensors/flexors nonpainful and intact active range of motion Skin: Skin is warm and dry. No rash noted. No erythema.  Psychiatric: She has a normal mood and affect. Her behavior is normal. Judgment and thought content normal.   Lab Results  Component Value Date   WBC 5.4 01/27/2013   HGB 14.1 01/27/2013   HCT 40.8  01/27/2013   PLT 220.0 01/27/2013   GLUCOSE 89 01/27/2013   CHOL 219* 01/27/2013   TRIG 99.0 01/27/2013   HDL 82.10 01/27/2013   LDLDIRECT 96.1 01/27/2013   ALT 21 01/27/2013   AST 23 01/27/2013   NA 139 01/27/2013   K 4.3 01/27/2013   CL 106 01/27/2013   CREATININE 0.8 01/27/2013   BUN 12 01/27/2013   CO2 27 01/27/2013   TSH 2.35 01/27/2013   Dg Ribs Unilateral W/chest Left  03/19/2013   *RADIOLOGY REPORT*  Clinical Data: Fall, left chest pain  LEFT RIBS AND CHEST - 3+ VIEW  Comparison: Chest radiograph dated 09/16/2010  Findings: Lungs are essentially clear.  No focal consolidation. No pleural effusion or pneumothorax.  The heart is normal in size.  No displaced left rib fracture is seen.  IMPRESSION: No evidence of acute cardiopulmonary disease.  No displaced left rib fracture is seen.   Original Report Authenticated By: Charline Bills, M.D.        Assessment & Plan:   Left chest wall pain, no abnormality on breast to suggest traumatic hematoma. Recheck rib detail to exclude stress fracture. Initial films reviewed and unremarkable but osteoporosis and high risk for injury reviewed. Okay to delay screening mammography 2-4 weeks until pain improved. Treat with anti-inflammatory ibuprofen 600 mg 3 times a day with food x1 week, then as needed. Education reassurance provided  Left wrist pain. Check plain film to exclude scaphoid  fracture. Wrist splint to rest advised. Ibuprofen 3 times a day as above. Refer to orthopedics if fracture identified or persisting pain despite conservative care  Osteoporosis, severe. Changed to Prolia from ongoing bisphosphonate therapy as discussed July 22.

## 2013-03-30 ENCOUNTER — Telehealth: Payer: Self-pay | Admitting: Internal Medicine

## 2013-03-30 ENCOUNTER — Telehealth: Payer: Self-pay | Admitting: *Deleted

## 2013-03-30 NOTE — Telephone Encounter (Signed)
I feel this combination IS SAFE for short period of time - taking the ibuprofen with lithium can increase level of Lithium in blood, but there is no toxic effect of taking both, especially for short time (as recommended for 1 week) If pt does not feel comfortable with taking ibuprofen as rx'd for 1 week, then i unfortunately do not have any other suggestion for pain mgmt except to use ice/heat to affected area and give it more time (6 weeks or more) to heal  thanks

## 2013-03-30 NOTE — Telephone Encounter (Signed)
Pt call bck gave her md response. See previous msg was closed by mistake...lmb

## 2013-03-30 NOTE — Telephone Encounter (Signed)
Pt returned a triage call.  Please call her at [phone].

## 2013-03-30 NOTE — Telephone Encounter (Signed)
Pt called states she was prescribed Ibuprofen which has an interaction with her Lithium.  Pt states she can not take NSAIDS with Lithium.  She is requesting an alternative.  Please advise

## 2013-03-30 NOTE — Telephone Encounter (Signed)
Left message for pt to return call.

## 2013-03-31 ENCOUNTER — Ambulatory Visit (HOSPITAL_COMMUNITY): Payer: Medicare PPO

## 2013-04-07 ENCOUNTER — Ambulatory Visit (INDEPENDENT_AMBULATORY_CARE_PROVIDER_SITE_OTHER): Payer: Medicare PPO | Admitting: *Deleted

## 2013-04-07 DIAGNOSIS — M810 Age-related osteoporosis without current pathological fracture: Secondary | ICD-10-CM

## 2013-04-07 MED ORDER — DENOSUMAB 60 MG/ML ~~LOC~~ SOLN
60.0000 mg | Freq: Once | SUBCUTANEOUS | Status: AC
  Administered 2013-04-07: 60 mg via SUBCUTANEOUS

## 2013-04-14 ENCOUNTER — Ambulatory Visit (HOSPITAL_COMMUNITY)
Admission: RE | Admit: 2013-04-14 | Discharge: 2013-04-14 | Disposition: A | Discharge status: Home / Self Care | Payer: Medicare PPO | Source: Ambulatory Visit | Attending: Obstetrics and Gynecology | Admitting: Obstetrics and Gynecology

## 2013-04-14 ENCOUNTER — Ambulatory Visit (HOSPITAL_COMMUNITY): Payer: Medicare PPO

## 2013-04-14 DIAGNOSIS — Z1231 Encounter for screening mammogram for malignant neoplasm of breast: Secondary | ICD-10-CM | POA: Insufficient documentation

## 2013-04-19 ENCOUNTER — Other Ambulatory Visit: Payer: Self-pay | Admitting: Obstetrics and Gynecology

## 2013-04-19 DIAGNOSIS — R928 Other abnormal and inconclusive findings on diagnostic imaging of breast: Secondary | ICD-10-CM

## 2013-04-25 ENCOUNTER — Telehealth: Payer: Self-pay | Admitting: *Deleted

## 2013-04-25 DIAGNOSIS — F341 Dysthymic disorder: Secondary | ICD-10-CM

## 2013-04-25 DIAGNOSIS — Z1239 Encounter for other screening for malignant neoplasm of breast: Secondary | ICD-10-CM

## 2013-04-25 DIAGNOSIS — N951 Menopausal and female climacteric states: Secondary | ICD-10-CM

## 2013-04-25 DIAGNOSIS — M810 Age-related osteoporosis without current pathological fracture: Secondary | ICD-10-CM

## 2013-04-25 NOTE — Telephone Encounter (Signed)
Pt states since her insurance has change to [employer] she is needing referrals. [facility] If she need a referral for them. Also need a back dated referral for her gynecologist Dr. Dierdre Forth saw her 4 months ago and wasn't told she needed a referral. Have appt to see psychiatrist Ellis Savage in Oct will need a referral.../lmb

## 2013-04-25 NOTE — Telephone Encounter (Signed)
Referrals placed for mammo @ [facility] and [facility] as requested

## 2013-04-25 NOTE — Telephone Encounter (Signed)
Notified pt md has entered referral...lmb

## 2013-05-10 ENCOUNTER — Ambulatory Visit
Admission: RE | Admit: 2013-05-10 | Discharge: 2013-05-10 | Disposition: A | Discharge status: Home / Self Care | Payer: Commercial Managed Care - HMO | Source: Ambulatory Visit | Attending: Obstetrics and Gynecology | Admitting: Obstetrics and Gynecology

## 2013-05-10 ENCOUNTER — Other Ambulatory Visit: Payer: Self-pay | Admitting: Obstetrics and Gynecology

## 2013-05-10 DIAGNOSIS — R928 Other abnormal and inconclusive findings on diagnostic imaging of breast: Secondary | ICD-10-CM

## 2013-05-19 ENCOUNTER — Ambulatory Visit
Admission: RE | Admit: 2013-05-19 | Discharge: 2013-05-19 | Disposition: A | Discharge status: Home / Self Care | Payer: Commercial Managed Care - HMO | Source: Ambulatory Visit | Attending: Obstetrics and Gynecology | Admitting: Obstetrics and Gynecology

## 2013-05-19 DIAGNOSIS — R928 Other abnormal and inconclusive findings on diagnostic imaging of breast: Secondary | ICD-10-CM

## 2013-07-11 ENCOUNTER — Ambulatory Visit: Payer: Medicare PPO | Admitting: Internal Medicine

## 2013-09-23 ENCOUNTER — Telehealth: Payer: Self-pay | Admitting: *Deleted

## 2013-09-23 NOTE — Telephone Encounter (Signed)
Ok please call pt and let her know I have sent a msg to Mrs. Bertram Millard. Once she has verified insurance I will call her back to set up nurse visit appt...Leah Jensen

## 2013-09-23 NOTE — Telephone Encounter (Signed)
Spoke with pt advised scheduling after insurance authorization

## 2013-09-23 NOTE — Telephone Encounter (Signed)
Pt called states she was asked to call toward the end of January to make sure her Prolia is ordered.  Please advise pt when to schedule.

## 2013-10-10 ENCOUNTER — Encounter: Payer: Self-pay | Admitting: Internal Medicine

## 2013-10-10 ENCOUNTER — Ambulatory Visit (INDEPENDENT_AMBULATORY_CARE_PROVIDER_SITE_OTHER): Payer: Medicare PPO | Admitting: Internal Medicine

## 2013-10-10 ENCOUNTER — Ambulatory Visit (INDEPENDENT_AMBULATORY_CARE_PROVIDER_SITE_OTHER): Payer: Medicare PPO

## 2013-10-10 VITALS — BP 112/72 | HR 63 | Temp 98.5°F | Ht 63.75 in | Wt 139.0 lb

## 2013-10-10 DIAGNOSIS — Z0000 Encounter for general adult medical examination without abnormal findings: Secondary | ICD-10-CM

## 2013-10-10 DIAGNOSIS — Z124 Encounter for screening for malignant neoplasm of cervix: Secondary | ICD-10-CM

## 2013-10-10 DIAGNOSIS — Z79899 Other long term (current) drug therapy: Secondary | ICD-10-CM

## 2013-10-10 DIAGNOSIS — F341 Dysthymic disorder: Secondary | ICD-10-CM

## 2013-10-10 DIAGNOSIS — M810 Age-related osteoporosis without current pathological fracture: Secondary | ICD-10-CM

## 2013-10-10 LAB — CBC WITH DIFFERENTIAL/PLATELET
BASOS PCT: 0.5 % (ref 0.0–3.0)
Basophils Absolute: 0 10*3/uL (ref 0.0–0.1)
Eosinophils Absolute: 0.1 10*3/uL (ref 0.0–0.7)
Eosinophils Relative: 1.7 % (ref 0.0–5.0)
HEMATOCRIT: 42.8 % (ref 36.0–46.0)
HEMOGLOBIN: 14.1 g/dL (ref 12.0–15.0)
LYMPHS ABS: 2 10*3/uL (ref 0.7–4.0)
LYMPHS PCT: 35.3 % (ref 12.0–46.0)
MCHC: 32.9 g/dL (ref 30.0–36.0)
MCV: 91.7 fl (ref 78.0–100.0)
MONO ABS: 0.5 10*3/uL (ref 0.1–1.0)
Monocytes Relative: 9.3 % (ref 3.0–12.0)
NEUTROS ABS: 3 10*3/uL (ref 1.4–7.7)
Neutrophils Relative %: 53.2 % (ref 43.0–77.0)
Platelets: 289 10*3/uL (ref 150.0–400.0)
RBC: 4.67 Mil/uL (ref 3.87–5.11)
RDW: 15 % — AB (ref 11.5–14.6)
WBC: 5.6 10*3/uL (ref 4.5–10.5)

## 2013-10-10 LAB — URINALYSIS, ROUTINE W REFLEX MICROSCOPIC
Bilirubin Urine: NEGATIVE
HGB URINE DIPSTICK: NEGATIVE
Ketones, ur: NEGATIVE
Leukocytes, UA: NEGATIVE
NITRITE: NEGATIVE
Specific Gravity, Urine: >=1.03 — AB (ref 1.000–1.030)
TOTAL PROTEIN, URINE-UPE24: NEGATIVE
Urine Glucose: NEGATIVE
Urobilinogen, UA: 0.2 (ref 0.0–1.0)
pH: 5.5 (ref 5.0–8.0)

## 2013-10-10 LAB — LIPID PANEL
CHOL/HDL RATIO: 3
CHOLESTEROL: 247 mg/dL — AB (ref 0–200)
HDL: 72.3 mg/dL (ref >39.00)
TRIGLYCERIDES: 150 mg/dL — AB (ref 0.0–149.0)
VLDL: 30 mg/dL (ref 0.0–40.0)

## 2013-10-10 LAB — BASIC METABOLIC PANEL
BUN: 9 mg/dL (ref 6–23)
CHLORIDE: 103 meq/L (ref 96–112)
CO2: 27 meq/L (ref 19–32)
Calcium: 9.5 mg/dL (ref 8.4–10.5)
Creatinine, Ser: 0.8 mg/dL (ref 0.4–1.2)
GFR: 76.87 mL/min (ref >60.00)
GLUCOSE: 84 mg/dL (ref 70–99)
POTASSIUM: 4.4 meq/L (ref 3.5–5.1)
SODIUM: 139 meq/L (ref 135–145)

## 2013-10-10 LAB — HEPATIC FUNCTION PANEL
ALBUMIN: 3.9 g/dL (ref 3.5–5.2)
ALT: 18 U/L (ref 0–35)
AST: 22 U/L (ref 0–37)
Alkaline Phosphatase: 37 U/L — ABNORMAL LOW (ref 39–117)
BILIRUBIN TOTAL: 0.9 mg/dL (ref 0.3–1.2)
Bilirubin, Direct: 0 mg/dL (ref 0.0–0.3)
Total Protein: 7 g/dL (ref 6.0–8.3)

## 2013-10-10 LAB — TSH: TSH: 1.47 u[IU]/mL (ref 0.35–5.50)

## 2013-10-10 MED ORDER — DENOSUMAB 60 MG/ML ~~LOC~~ SOLN
60.0000 mg | Freq: Once | SUBCUTANEOUS | Status: AC
  Administered 2013-10-10: 60 mg via SUBCUTANEOUS

## 2013-10-10 NOTE — Assessment & Plan Note (Signed)
DEXA 10/2010 reviewed, min improved on 10/2012 follow up (-3.6, prev -4.5) - on Ca + Vit D approp and WB exercise Started bisphos weekly 09/2011 but no improvement on bone density Changed to Prolia 04/2013 - 2nd injection today (10/10/13) Recheck DEXA 10/2014 to monitor

## 2013-10-10 NOTE — Assessment & Plan Note (Addendum)
Follows with psyc for same - on Lithium, lamictal and SNRI + prn BZ Considering wean off Li - defer to follow up with psyc Check level now Continue followup with behavioral health provider as ongoing, patient agrees to call sooner if symptoms worse or unimproved

## 2013-10-10 NOTE — Progress Notes (Signed)
Pre-visit discussion using our clinic review tool. No additional management support is needed unless otherwise documented below in the visit note.  

## 2013-10-10 NOTE — Progress Notes (Signed)
Subjective:    Patient ID: Leah Jensen, female    DOB: [birth year], 68 y.o.   MRN: [id number]  HPI  Here for medicare wellness  Diet: heart healthy  Physical activity: active WB exercises Depression/mood screen: negative - on tx with specialist Hearing: intact to whispered voice Visual acuity: grossly normal, performs annual eye exam  ADLs: capable Fall risk: none Home safety: good Cognitive evaluation: intact to orientation, naming, recall and repetition EOL planning: adv directives, full code/ I agree  I have personally reviewed and have noted 1. The patient's medical and social history 2. Their use of alcohol, tobacco or illicit drugs 3. Their current medications and supplements 4. The patient's functional ability including ADL's, fall risks, home safety risks and hearing or visual impairment. 5. Diet and physical activities 6. Evidence for depression or mood disorders  Also reviewed chronic medical issues and interval medical events:  depression/anxiety - follows with psyc in [place] - reports compliance with ongoing medical treatment and no changes in medication dose or frequency. denies adverse side effects related to current therapy. hosp overnight 09/2010 for chest pain which was related to panic attack (after neg myoview stress testing and echo)     osteoarthritis , R knee - uses OTC supplements (organic tumeric) and meloxicam from ortho with fair relief of daily pain symptoms - no swelling, no popping or falls - no injury hx recalled   allergic rhinitis - reports compliance with ongoing medical treatment and no changes in medication dose or frequency. denies adverse side effects related to current therapy.     essential tremor - L > R hand and LE - uses propanolol to control same but dose limited by low blood pressure with titration of bbloc -    Osteoporosis - reviewed results of DEXA 10/2010> -4.5 spine, started fosamax weekly 09/2011 - denies bone pain or new back  pain - no fractures, no falls - takes Vit D and Ca daily   Past Medical History  Diagnosis Date  . ALLERGIC RHINITIS   . OSTEOARTHRITIS, KNEE, RIGHT   . DEPRESSION/ANXIETY   . TREMOR, ESSENTIAL   . Low back pain   . 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 Anxiety    Family History  Problem Relation Age of Onset  . Arthritis Mother   . Heart disease Mother   . Lung disease Mother   . Arthritis Father   . Heart disease Father   . Breast cancer Paternal Aunt     History  Substance Use Topics  . Smoking status: Never Smoker   . Smokeless tobacco: Never Used     Comment: Divorced, lives alone and single  . Alcohol Use: No    Review of Systems  Constitutional: Negative for fatigue and unexpected weight change.  Respiratory: Negative for cough, shortness of breath and wheezing.   Cardiovascular: Negative for chest pain, palpitations and leg swelling.  Gastrointestinal: Negative for nausea, abdominal pain and diarrhea.  Neurological: Negative for dizziness, weakness, light-headedness and headaches.  Psychiatric/Behavioral: Negative for dysphoric mood. The patient is not nervous/anxious.   All other systems reviewed and are negative.        Objective:   Physical Exam BP 112/72  Pulse 63  Temp(Src) 98.5 F (36.9 C) (Oral)  Ht 5' 3.75" (1.619 m)  Wt 139 lb (63.05 kg)  BMI 24.05 kg/m2  SpO2 95% Wt Readings from Last 3 Encounters:  10/10/13 139  lb (63.05 kg)  03/29/13 145 lb 12.8 oz (66.134 kg)  03/22/13 147 lb 1.9 oz (66.733 kg)   Constitutional: She appears well-developed and well-nourished. No distress.  Neck: Normal range of motion. Neck supple. No JVD present. No thyromegaly present.  Cardiovascular: Normal rate, regular rhythm and normal heart sounds.  No murmur heard. No BLE edema. Pulmonary/Chest: Effort normal and breath sounds normal. No respiratory distress. She has no wheezes.  Skin:  no erythema or rash - no cellulitis, ulceration or hives Psychiatric: She has a normal mood and affect. Her behavior is normal. Judgment and thought content normal.   Lab Results  Component Value Date   WBC 5.4 01/27/2013   HGB 14.1 01/27/2013   HCT 40.8 01/27/2013   PLT 220.0 01/27/2013   GLUCOSE 89 01/27/2013   CHOL 219* 01/27/2013   TRIG 99.0 01/27/2013   HDL 82.10 01/27/2013   LDLDIRECT 96.1 01/27/2013   ALT 21 01/27/2013   AST 23 01/27/2013   NA 139 01/27/2013   K 4.3 01/27/2013   CL 106 01/27/2013   CREATININE 0.8 01/27/2013   BUN 12 01/27/2013   CO2 27 01/27/2013   TSH 2.35 01/27/2013      Assessment & Plan:  AWV - v70.0 - Today patient counseled on age appropriate routine health concerns for screening and prevention, each reviewed and up to date or declined. Immunizations reviewed and up to date or declined. Labs ordered & reviewed. Risk factors for depression reviewed and negative. Hearing function and visual acuity are intact. ADLs screened and addressed as needed. Functional ability and level of safety reviewed and appropriate. Education, counseling and referrals performed based on assessed risks today. Patient provided with a copy of personalized plan for preventive services.  Problem List Items Addressed This Visit   DEPRESSION/ANXIETY     Follows with psyc for same - on Lithium, lamictal and SNRI + prn BZ Considering wean off Li - defer to follow up with psyc Check level now Continue followup with behavioral health provider as ongoing, patient agrees to call sooner if symptoms worse or unimproved    Osteoporosis, postmenopausal     DEXA 10/2010 reviewed, min improved on 10/2012 follow up (-3.6, prev -4.5) - on Ca + Vit D approp and WB exercise Started bisphos weekly 09/2011 but no improvement on bone density Changed to Prolia 04/2013 - 2nd injection today (10/10/13) Recheck DEXA 10/2014 to monitor    Relevant Medications      denosumab (PROLIA) injection 60 mg (Completed)    Other  Visit Diagnoses   Routine general medical examination at a health care facility    -  Primary    Relevant Orders       TSH       Lipid panel       Hepatic function panel       Basic metabolic panel       CBC with Differential       Urinalysis, Routine w reflex microscopic    Encounter for long-term (current) use of other medications        Relevant Orders       Lithium level    Screening for cervical cancer        Relevant Orders       Ambulatory referral to Obstetrics / Gynecology

## 2013-10-10 NOTE — Patient Instructions (Addendum)
It was good to see you today.  We have reviewed your prior records including labs and tests today  Health Maintenance reviewed - all recommended immunizations and age-appropriate screenings are up-to-date.  Test(s) ordered today. Your results will be released to [facility] (or called to you) after review, usually within 72hours after test completion. If any changes need to be made, you will be notified at that same time.  Medications reviewed and updated, no changes recommended at this time.  Please schedule followup in 12 months for annual exam and labs, call sooner if problems.  Health Maintenance, Female A healthy lifestyle and preventative care can promote health and wellness.  Maintain regular health, dental, and eye exams.  Eat a healthy diet. Foods like vegetables, fruits, whole grains, low-fat dairy products, and lean protein foods contain the nutrients you need without too many calories. Decrease your intake of foods high in solid fats, added sugars, and salt. Get information about a proper diet from your caregiver, if necessary.  Regular physical exercise is one of the most important things you can do for your health. Most adults should get at least 150 minutes of moderate-intensity exercise (any activity that increases your heart rate and causes you to sweat) each week. In addition, most adults need muscle-strengthening exercises on 2 or more days a week.   Maintain a healthy weight. The body mass index (BMI) is a screening tool to identify possible weight problems. It provides an estimate of body fat based on height and weight. Your caregiver can help determine your BMI, and can help you achieve or maintain a healthy weight. For adults 20 years and older:  A BMI below 18.5 is considered underweight.  A BMI of 18.5 to 24.9 is normal.  A BMI of 25 to 29.9 is considered overweight.  A BMI of 30 and above is considered obese.  Maintain normal blood lipids and cholesterol by  exercising and minimizing your intake of saturated fat. Eat a balanced diet with plenty of fruits and vegetables. Blood tests for lipids and cholesterol should begin at age 96 and be repeated every 5 years. If your lipid or cholesterol levels are high, you are over 50, or you are a high risk for heart disease, you may need your cholesterol levels checked more frequently.Ongoing high lipid and cholesterol levels should be treated with medicines if diet and exercise are not effective.  If you smoke, find out from your caregiver how to quit. If you do not use tobacco, do not start.  Lung cancer screening is recommended for adults aged 32 80 years who are at high risk for developing lung cancer because of a history of smoking. Yearly low-dose computed tomography (CT) is recommended for people who have at least a 30-pack-year history of smoking and are a current smoker or have quit within the past 15 years. A pack year of smoking is smoking an average of 1 pack of cigarettes a day for 1 year (for example: 1 pack a day for 30 years or 2 packs a day for 15 years). Yearly screening should continue until the smoker has stopped smoking for at least 15 years. Yearly screening should also be stopped for people who develop a health problem that would prevent them from having lung cancer treatment.  If you are pregnant, do not drink alcohol. If you are breastfeeding, be very cautious about drinking alcohol. If you are not pregnant and choose to drink alcohol, do not exceed 1 drink per day. One drink  is considered to be 12 ounces (355 mL) of beer, 5 ounces (148 mL) of wine, or 1.5 ounces (44 mL) of liquor.  Avoid use of street drugs. Do not share needles with anyone. Ask for help if you need support or instructions about stopping the use of drugs.  High blood pressure causes heart disease and increases the risk of stroke. Blood pressure should be checked at least every 1 to 2 years. Ongoing high blood pressure should be  treated with medicines, if weight loss and exercise are not effective.  If you are 39 to 69 years old, ask your caregiver if you should take aspirin to prevent strokes.  Diabetes screening involves taking a blood sample to check your fasting blood sugar level. This should be done once every 3 years, after age 66, if you are within normal weight and without risk factors for diabetes. Testing should be considered at a younger age or be carried out more frequently if you are overweight and have at least 1 risk factor for diabetes.  Breast cancer screening is essential preventative care for women. You should practice "breast self-awareness." This means understanding the normal appearance and feel of your breasts and may include breast self-examination. Any changes detected, no matter how small, should be reported to a caregiver. Women in their 73s and 30s should have a clinical breast exam (CBE) by a caregiver as part of a regular health exam every 1 to 3 years. After age 39, women should have a CBE every year. Starting at age 91, women should consider having a mammogram (breast X-ray) every year. Women who have a family history of breast cancer should talk to their caregiver about genetic screening. Women at a high risk of breast cancer should talk to their caregiver about having an MRI and a mammogram every year.  Breast cancer gene (BRCA)-related cancer risk assessment is recommended for women who have family members with BRCA-related cancers. BRCA-related cancers include breast, ovarian, tubal, and peritoneal cancers. Having family members with these cancers may be associated with an increased risk for harmful changes (mutations) in the breast cancer genes BRCA1 and BRCA2. Results of the assessment will determine the need for genetic counseling and BRCA1 and BRCA2 testing.  The Pap test is a screening test for cervical cancer. Women should have a Pap test starting at age 63. Between ages 85 and 56, Pap  tests should be repeated every 2 years. Beginning at age 29, you should have a Pap test every 3 years as long as the past 3 Pap tests have been normal. If you had a hysterectomy for a problem that was not cancer or a condition that could lead to cancer, then you no longer need Pap tests. If you are between ages 49 and 47, and you have had normal Pap tests going back 10 years, you no longer need Pap tests. If you have had past treatment for cervical cancer or a condition that could lead to cancer, you need Pap tests and screening for cancer for at least 20 years after your treatment. If Pap tests have been discontinued, risk factors (such as a new sexual partner) need to be reassessed to determine if screening should be resumed. Some women have medical problems that increase the chance of getting cervical cancer. In these cases, your caregiver may recommend more frequent screening and Pap tests.  The human papillomavirus (HPV) test is an additional test that may be used for cervical cancer screening. The HPV test looks for  the virus that can cause the cell changes on the cervix. The cells collected during the Pap test can be tested for HPV. The HPV test could be used to screen women aged 31 years and older, and should be used in women of any age who have unclear Pap test results. After the age of 35, women should have HPV testing at the same frequency as a Pap test.  Colorectal cancer can be detected and often prevented. Most routine colorectal cancer screening begins at the age of 76 and continues through age 69. However, your caregiver may recommend screening at an earlier age if you have risk factors for colon cancer. On a yearly basis, your caregiver may provide home test kits to check for hidden blood in the stool. Use of a small camera at the end of a tube, to directly examine the colon (sigmoidoscopy or colonoscopy), can detect the earliest forms of colorectal cancer. Talk to your caregiver about this at  age 68, when routine screening begins. Direct examination of the colon should be repeated every 5 to 10 years through age 84, unless early forms of pre-cancerous polyps or small growths are found.  Hepatitis C blood testing is recommended for all people born from [birth year] through 1966 and any individual with known risks for hepatitis C.  Practice safe sex. Use condoms and avoid high-risk sexual practices to reduce the spread of sexually transmitted infections (STIs). Sexually active women aged 66 and younger should be checked for Chlamydia, which is a common sexually transmitted infection. Older women with new or multiple partners should also be tested for Chlamydia. Testing for other STIs is recommended if you are sexually active and at increased risk.  Osteoporosis is a disease in which the bones lose minerals and strength with aging. This can result in serious bone fractures. The risk of osteoporosis can be identified using a bone density scan. Women ages 65 and over and women at risk for fractures or osteoporosis should discuss screening with their caregivers. Ask your caregiver whether you should be taking a calcium supplement or vitamin D to reduce the rate of osteoporosis.  Menopause can be associated with physical symptoms and risks. Hormone replacement therapy is available to decrease symptoms and risks. You should talk to your caregiver about whether hormone replacement therapy is right for you.  Use sunscreen. Apply sunscreen liberally and repeatedly throughout the day. You should seek shade when your shadow is shorter than you. Protect yourself by wearing long sleeves, pants, a wide-brimmed hat, and sunglasses year round, whenever you are outdoors.  Notify your caregiver of new moles or changes in moles, especially if there is a change in shape or color. Also notify your caregiver if a mole is larger than the size of a pencil eraser.  Stay current with your immunizations. Document Released:  03/03/2011 Document Revised: 12/13/2012 Document Reviewed: 03/03/2011 [facility] Patient Information 2014 [place].

## 2013-10-11 LAB — LDL CHOLESTEROL, DIRECT: LDL DIRECT: 104.2 mg/dL

## 2013-10-11 LAB — LITHIUM LEVEL: Lithium Lvl: 0.1 mEq/L — ABNORMAL LOW (ref 0.80–1.40)

## 2014-01-19 ENCOUNTER — Ambulatory Visit (INDEPENDENT_AMBULATORY_CARE_PROVIDER_SITE_OTHER): Payer: Commercial Managed Care - HMO | Admitting: Internal Medicine

## 2014-01-19 ENCOUNTER — Encounter: Payer: Self-pay | Admitting: Internal Medicine

## 2014-01-19 VITALS — BP 118/72 | HR 81 | Temp 97.7°F | Ht 64.5 in | Wt 145.0 lb

## 2014-01-19 DIAGNOSIS — M5430 Sciatica, unspecified side: Secondary | ICD-10-CM

## 2014-01-19 DIAGNOSIS — M5432 Sciatica, left side: Secondary | ICD-10-CM

## 2014-01-19 MED ORDER — GABAPENTIN 100 MG PO CAPS: ORAL_CAPSULE | ORAL | Status: DC

## 2014-01-19 NOTE — Patient Instructions (Signed)
The best exercises for the low back include freestyle swimming, stretch aerobics, and yoga.Cybex & Nautilus machines rather than dead weights are better for the back. Assess response to the gabapentin one every 8 hours as needed. If it is partially beneficial, it can be increased up to a total of 3 pills every 8 hours as needed. This increase of 1 pill each dose  should take place over 72 hours at least.

## 2014-01-19 NOTE — Progress Notes (Signed)
   Subjective:    Patient ID: Leah Jensen, female    DOB: 01/27/1946, 68 y.o.   MRN: [id number]  HPI  Her symptoms began 01/15/14 after she raised her left leg to get into a car. She had immediate pain in the left hip with radiation superiorly to the scapula and inferiorly to the left popliteal area. It was as severe as a 9 on a 10 scale. It has subsequently decreased to level II. It has fluctuated in severity and intensity.  She has applied ice and heat and taken pain medications with partial response.  She's also noted a pulling sensation in the left flank. This was associated with nausea. She received the most relief from heat but only while it was being applied.   Review of Systems  She's had excessive fatigue and some headaches but these do not appear to be related to this acute event   She specifically denies fever, chills, sweats, or unexplained weight loss  She's had no visual changes.  She denies chest pain or abdominal pain. She has had no dysuria, pyuria, or hematuria.  She has no weakness, numbness, tingling in her lower extremities. There's been no gait imbalance  She did not see any change in color or temperature of skin in the area of the symptoms. She's had no rash.        Objective:   Physical Exam  She appears in no acute distress and is not uncomfortable  Chest clear to auscultation with no increased work of breathing  Heart rhythm is regular  There is no pain to percussion of the flanks.  She has negative straight leg raising to 90 bilaterally   She is able to sit up from the table without difficulty  She no lymphadenopathy about the neck or axilla.  Gait including heel and toe walking normal.  Deep tendon reflexes, strength, and tone were normal  She does have osteoarthritic changes at the DIP joints of the hands. Neck reveals full ROM        Assessment & Plan:  #1 sciatic pain; no neurologic deficit present  See orders and after  visit summary

## 2014-01-19 NOTE — Progress Notes (Signed)
Pre visit review using our clinic review tool, if applicable. No additional management support is needed unless otherwise documented below in the visit note. 

## 2014-02-08 ENCOUNTER — Telehealth: Payer: Self-pay | Admitting: Internal Medicine

## 2014-02-08 NOTE — Telephone Encounter (Signed)
I went ahead and sent pt's insurance info to [employer] for verification so it will hopefully be back before her 04/10/2014 Prolia injection appointment. I will notify you as soon as I have a response. Thank you.

## 2014-02-08 NOTE — Telephone Encounter (Signed)
Thanks

## 2014-02-17 NOTE — Telephone Encounter (Signed)
I have received patient's insurance verification for her Prolia injection.  We do need to obtain a prior authorization prior to giving her the injection.  I have completed as much of the [facility] prior auth form as I can.  The remainder needs to be completed and signed by Dr. Asa Lente. Can I fax the form to you then once complete you can fax back to me [phone].  I will need your fax # as well. Thank you.

## 2014-02-22 NOTE — Telephone Encounter (Signed)
Just following up to see if I can fax the [facility] prior auth form to you? If so, I need your fax # please. Thank you!

## 2014-02-22 NOTE — Telephone Encounter (Signed)
Fax it to [phone] attn [place] or [place].

## 2014-03-01 NOTE — Telephone Encounter (Signed)
Faxed to Lucy's and [place]'s attn. Thank you.

## 2014-03-02 ENCOUNTER — Telehealth: Payer: Self-pay

## 2014-03-02 NOTE — Telephone Encounter (Signed)
Received fax for PA for Prolia. Gathered appropriate information to assist in the PA decision and sending to [facility].

## 2014-03-08 NOTE — Telephone Encounter (Signed)
PA was done by Colletta [place] see previous msg from 03/02/14. Closing encounter...Johny Chess

## 2014-03-15 ENCOUNTER — Encounter: Payer: Self-pay | Admitting: Internal Medicine

## 2014-03-15 ENCOUNTER — Telehealth: Payer: Self-pay | Admitting: Internal Medicine

## 2014-03-15 ENCOUNTER — Ambulatory Visit (INDEPENDENT_AMBULATORY_CARE_PROVIDER_SITE_OTHER): Payer: Commercial Managed Care - HMO | Admitting: Internal Medicine

## 2014-03-15 VITALS — BP 98/70 | HR 75 | Temp 98.4°F | Wt 157.4 lb

## 2014-03-15 DIAGNOSIS — R059 Cough, unspecified: Secondary | ICD-10-CM

## 2014-03-15 DIAGNOSIS — R05 Cough: Secondary | ICD-10-CM

## 2014-03-15 DIAGNOSIS — R1319 Other dysphagia: Secondary | ICD-10-CM

## 2014-03-15 DIAGNOSIS — M1712 Unilateral primary osteoarthritis, left knee: Secondary | ICD-10-CM

## 2014-03-15 MED ORDER — RANITIDINE HCL 150 MG PO TABS: 150.0000 mg | ORAL_TABLET | Freq: Two times a day (BID) | ORAL | Status: DC

## 2014-03-15 NOTE — Progress Notes (Signed)
   Subjective:    Patient ID: Leah Jensen, female    DOB: 11-18-1945, 68 y.o.   MRN: [id number]  HPI   She's had a sense of something in her throat for years. Over the last 2 months it has progressed. She notes that after taking 2 sips of water the third will not go down.  Additionally she now must drink fluid when she eats to prevent food dysphagia  During the same time. She has had morning cough    Review of Systems Unexplained weight loss, abdominal pain, significant dyspepsia, dysphagia, melena, rectal bleeding, or persistently small caliber stools are denied.      Objective:   Physical Exam General appearance is one of good health and nourishment w/o distress.  Eyes: No conjunctival inflammation or scleral icterus is present.  Oral exam: Dental hygiene is good; lips and gums are healthy appearing.There is mild  oropharyngeal erythema; no exudate noted.   Heart:  Normal rate and regular rhythm. S1 and S2 normal without gallop, murmur, click, rub or other extra sounds     Lungs:Chest clear to auscultation; no wheezes, rhonchi,rales ,or rubs present.No increased work of breathing.   Abdomen: bowel sounds normal, soft and non-tender without masses, organomegaly or hernias noted.  No guarding or rebound .  Musculoskeletal: Able to lie flat and sit up without help. Negative straight leg raising bilaterally. Gait normal  Skin:Warm & dry.  Intact without suspicious lesions or rashes ; no jaundice. Slight  tenting  Lymphatic: No lymphadenopathy is noted about the head, neck, axilla areas.                Assessment & Plan:  #1 dysphagia #2 cough See orders & AVS

## 2014-03-15 NOTE — Telephone Encounter (Signed)
Order placed so pt may continue seeing her ortho (as per insurance guidelines)

## 2014-03-15 NOTE — Patient Instructions (Signed)
Reflux of gastric acid may be asymptomatic as this may occur mainly during sleep.The triggers for reflux  include stress; the "aspirin family" ; alcohol; peppermint; and caffeine (coffee, tea, cola, and chocolate). The aspirin family would include aspirin and the nonsteroidal agents such as ibuprofen &  Naproxen. Tylenol would not cause reflux. If having symptoms ; food & drink should be avoided for @ least 2 hours before going to bed.  

## 2014-03-15 NOTE — Progress Notes (Signed)
Pre visit review using our clinic review tool, if applicable. No additional management support is needed unless otherwise documented below in the visit note. 

## 2014-04-10 ENCOUNTER — Ambulatory Visit (INDEPENDENT_AMBULATORY_CARE_PROVIDER_SITE_OTHER): Payer: Commercial Managed Care - HMO

## 2014-04-10 DIAGNOSIS — M810 Age-related osteoporosis without current pathological fracture: Secondary | ICD-10-CM

## 2014-04-10 MED ORDER — DENOSUMAB 60 MG/ML ~~LOC~~ SOLN
60.0000 mg | Freq: Once | SUBCUTANEOUS | Status: AC
  Administered 2014-04-10: 60 mg via SUBCUTANEOUS

## 2014-04-20 NOTE — Telephone Encounter (Signed)
Forwarding msg to [place]...Leah Jensen

## 2014-04-20 NOTE — Telephone Encounter (Signed)
Per [place] at [facility], the p/a was never sent to [facility] to process. She sent while on the phone w/me and included a note to process as stat. I will continue to follow up. Thank you.

## 2014-04-24 NOTE — Telephone Encounter (Signed)
I have rec'd a denial from [facility] for Leah Jensen's Prolia injection.  They are requesting additional info.  I have faxed over the denial to your attention for Dr. Asa Lente to review and to see if it can be appealed. If you have further questions, please let me know. Thank you.

## 2014-05-03 NOTE — Telephone Encounter (Signed)
Last week I faxed over a denial from [facility] for Ms. Hornbaker's Prolia injection to see if it could be appealed. Could you please update me w/the status? Thank you.

## 2014-05-18 ENCOUNTER — Telehealth: Payer: Self-pay

## 2014-05-18 DIAGNOSIS — M810 Age-related osteoporosis without current pathological fracture: Secondary | ICD-10-CM

## 2014-05-18 NOTE — Telephone Encounter (Signed)
Pa for Prolia was denied.   [employer] and they are sending in a form to provide more medical relevant information for the pt need for Prolia.  [employer] faxed form to be filled out. Will leave for MD to sign upon her return on Monday. (in yellow folder)

## 2014-05-22 ENCOUNTER — Ambulatory Visit (HOSPITAL_COMMUNITY): Payer: Commercial Managed Care - HMO

## 2014-05-29 ENCOUNTER — Ambulatory Visit (HOSPITAL_COMMUNITY)
Admission: RE | Admit: 2014-05-29 | Discharge: 2014-05-29 | Disposition: A | Discharge status: Home / Self Care | Payer: Medicare HMO | Source: Ambulatory Visit | Attending: Internal Medicine | Admitting: Internal Medicine

## 2014-05-29 DIAGNOSIS — Z1239 Encounter for other screening for malignant neoplasm of breast: Secondary | ICD-10-CM

## 2014-05-29 DIAGNOSIS — Z1231 Encounter for screening mammogram for malignant neoplasm of breast: Secondary | ICD-10-CM | POA: Diagnosis present

## 2014-05-31 IMAGING — CR DG RIBS 2V*L*
2 series · 2 of 2 positions shown · non-contrast
Comparison: 03/19/2013

CLINICAL DATA: Pain post fall 2 weeks ago

LEFT RIBS - 2 VIEW

[view not recorded (1 of 2)]
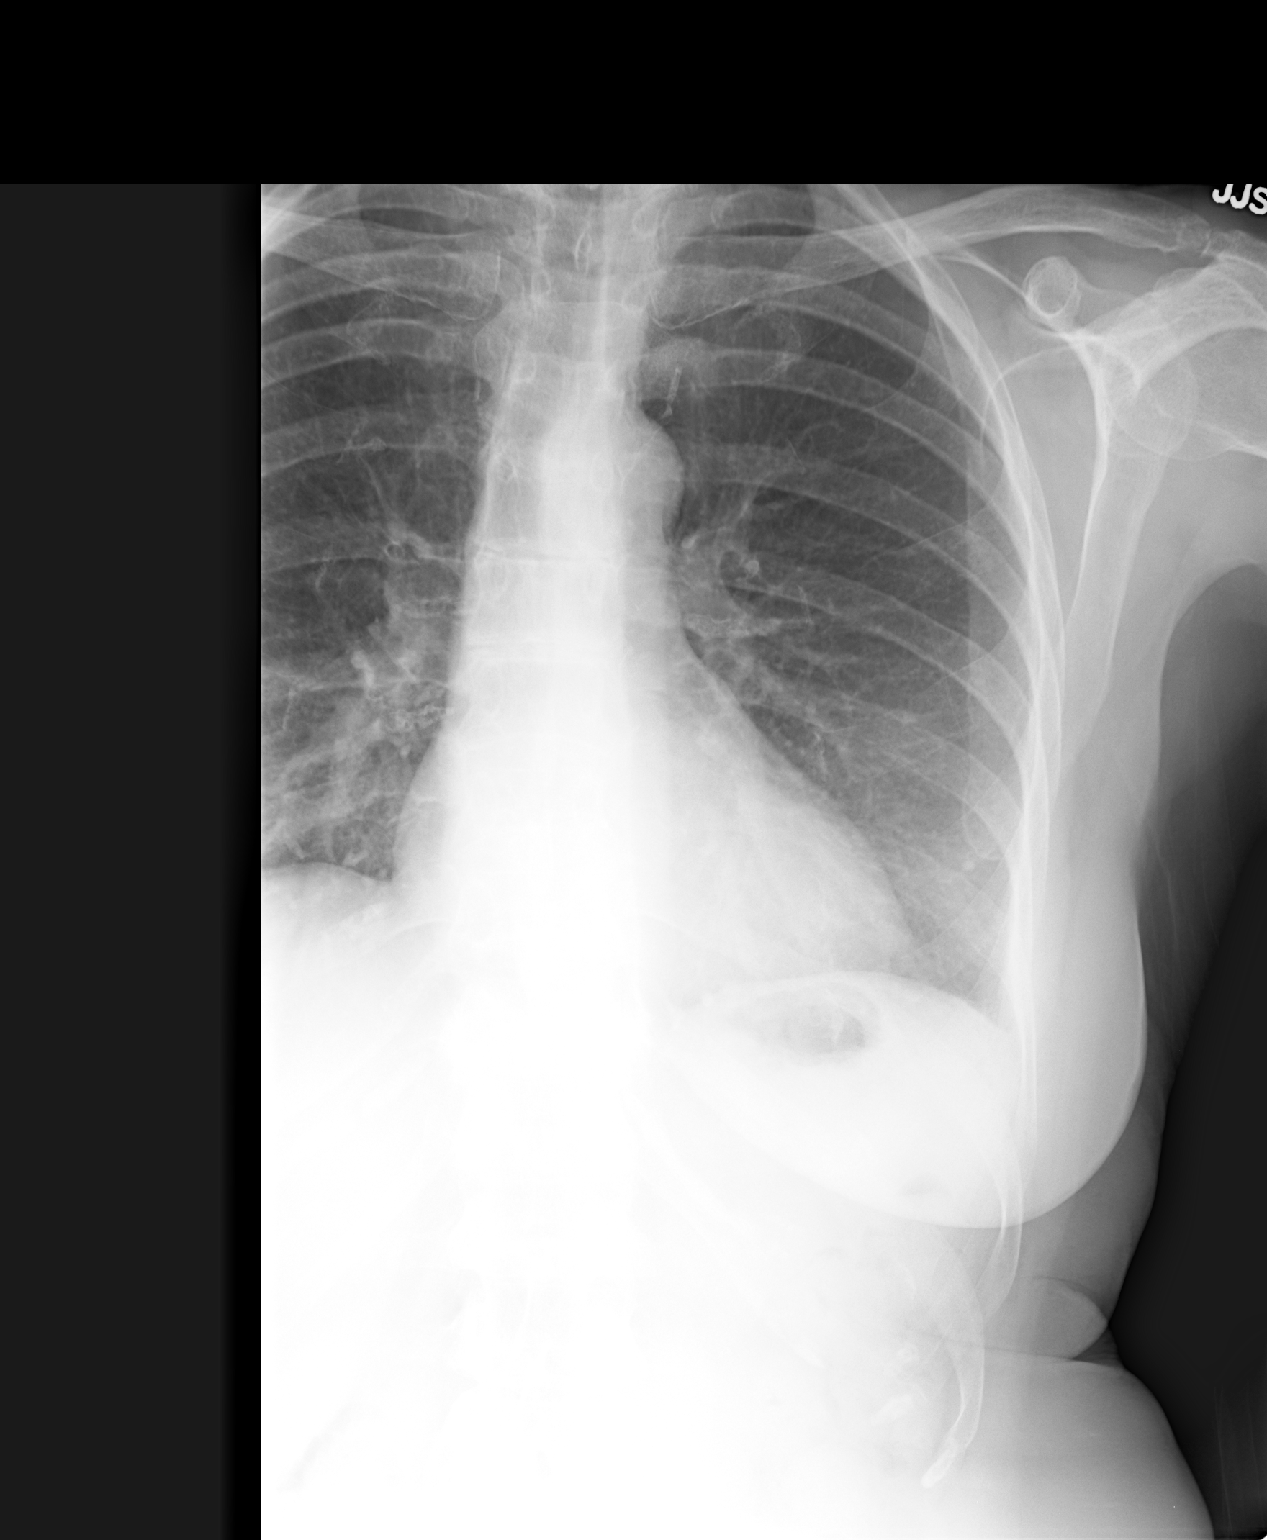

[view not recorded (2 of 2)]
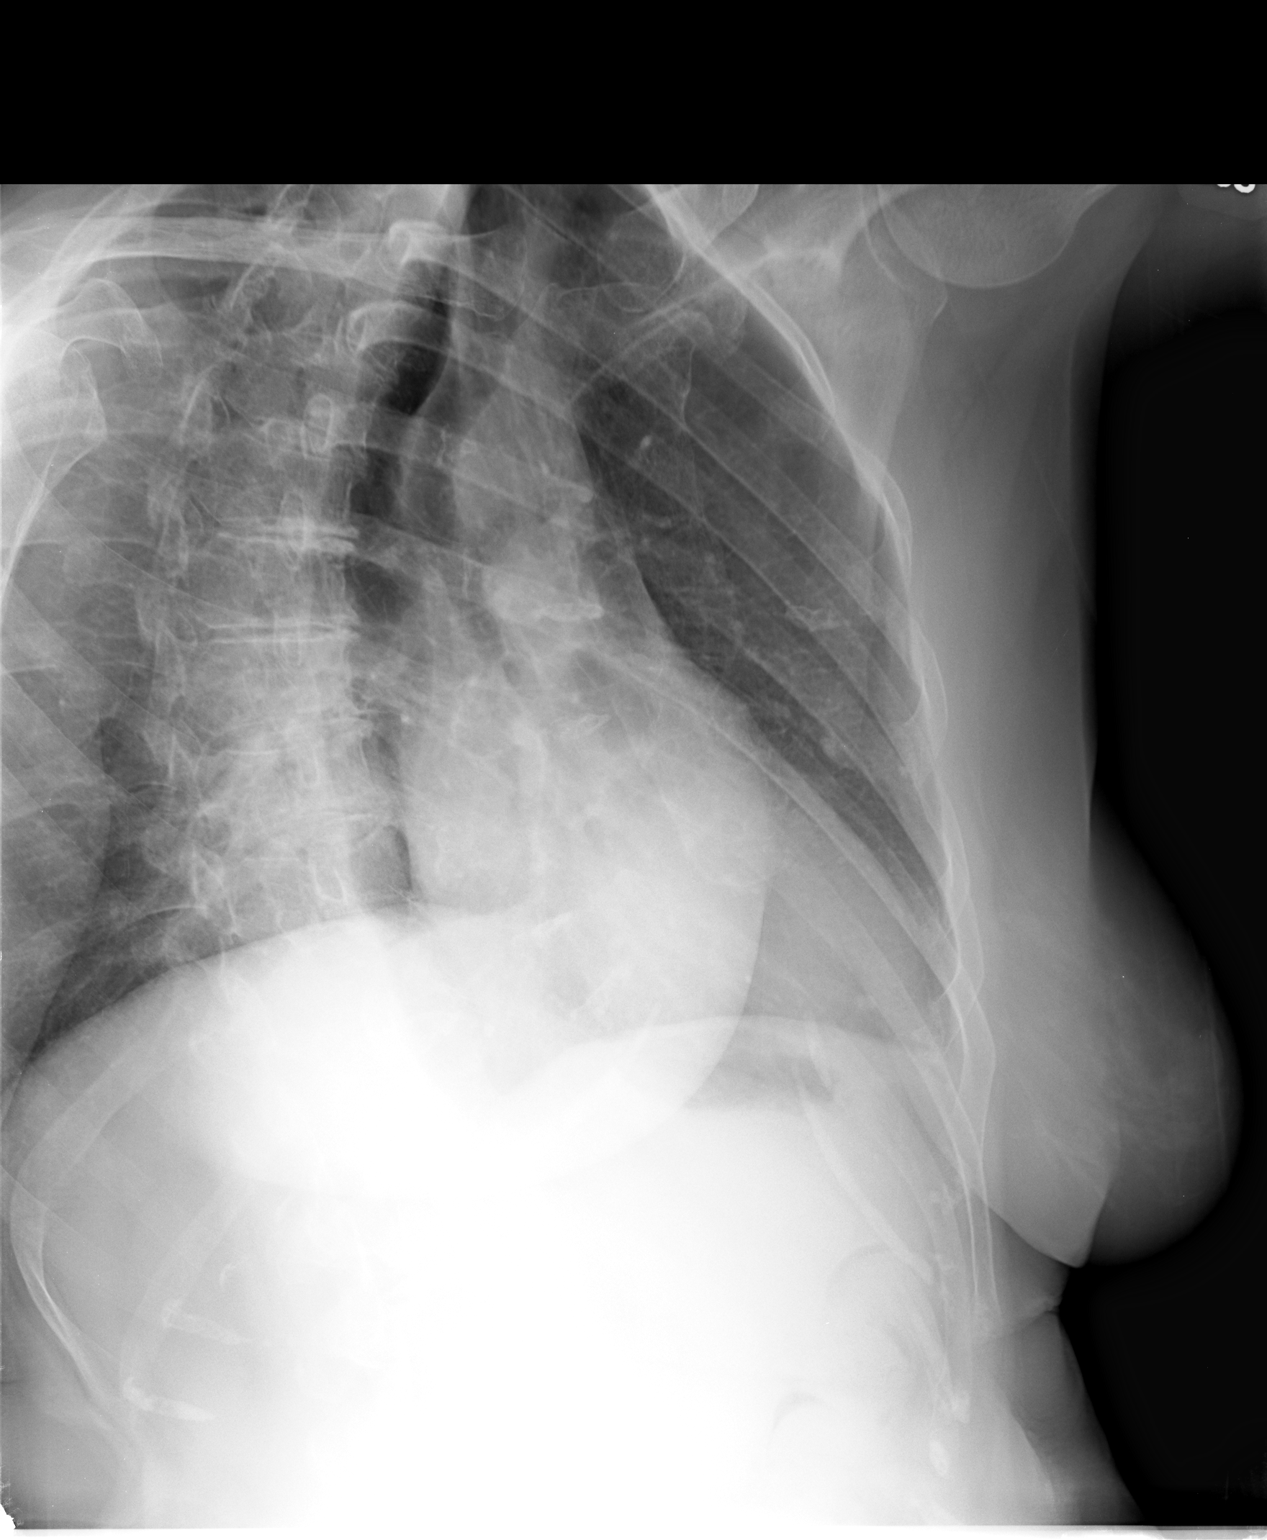

[2 of 2 positions shown; findings below may reference images not displayed]

FINDINGS: Two views left ribs submitted.  No left rib fracture is
identified.  No diagnostic pneumothorax.
IMPRESSION: No left rib fracture is identified.  No diagnostic pneumothorax.

## 2014-05-31 IMAGING — CR DG WRIST COMPLETE 3+V*L*
2 series · 2 of 2 positions shown · non-contrast
Comparison: None.

CLINICAL DATA: Radial side pain.  Rule out scaphoid fracture.  Fall
approximately 2 weeks ago.

LEFT WRIST - COMPLETE 3+ VIEW

[view not recorded (1 of 2)]
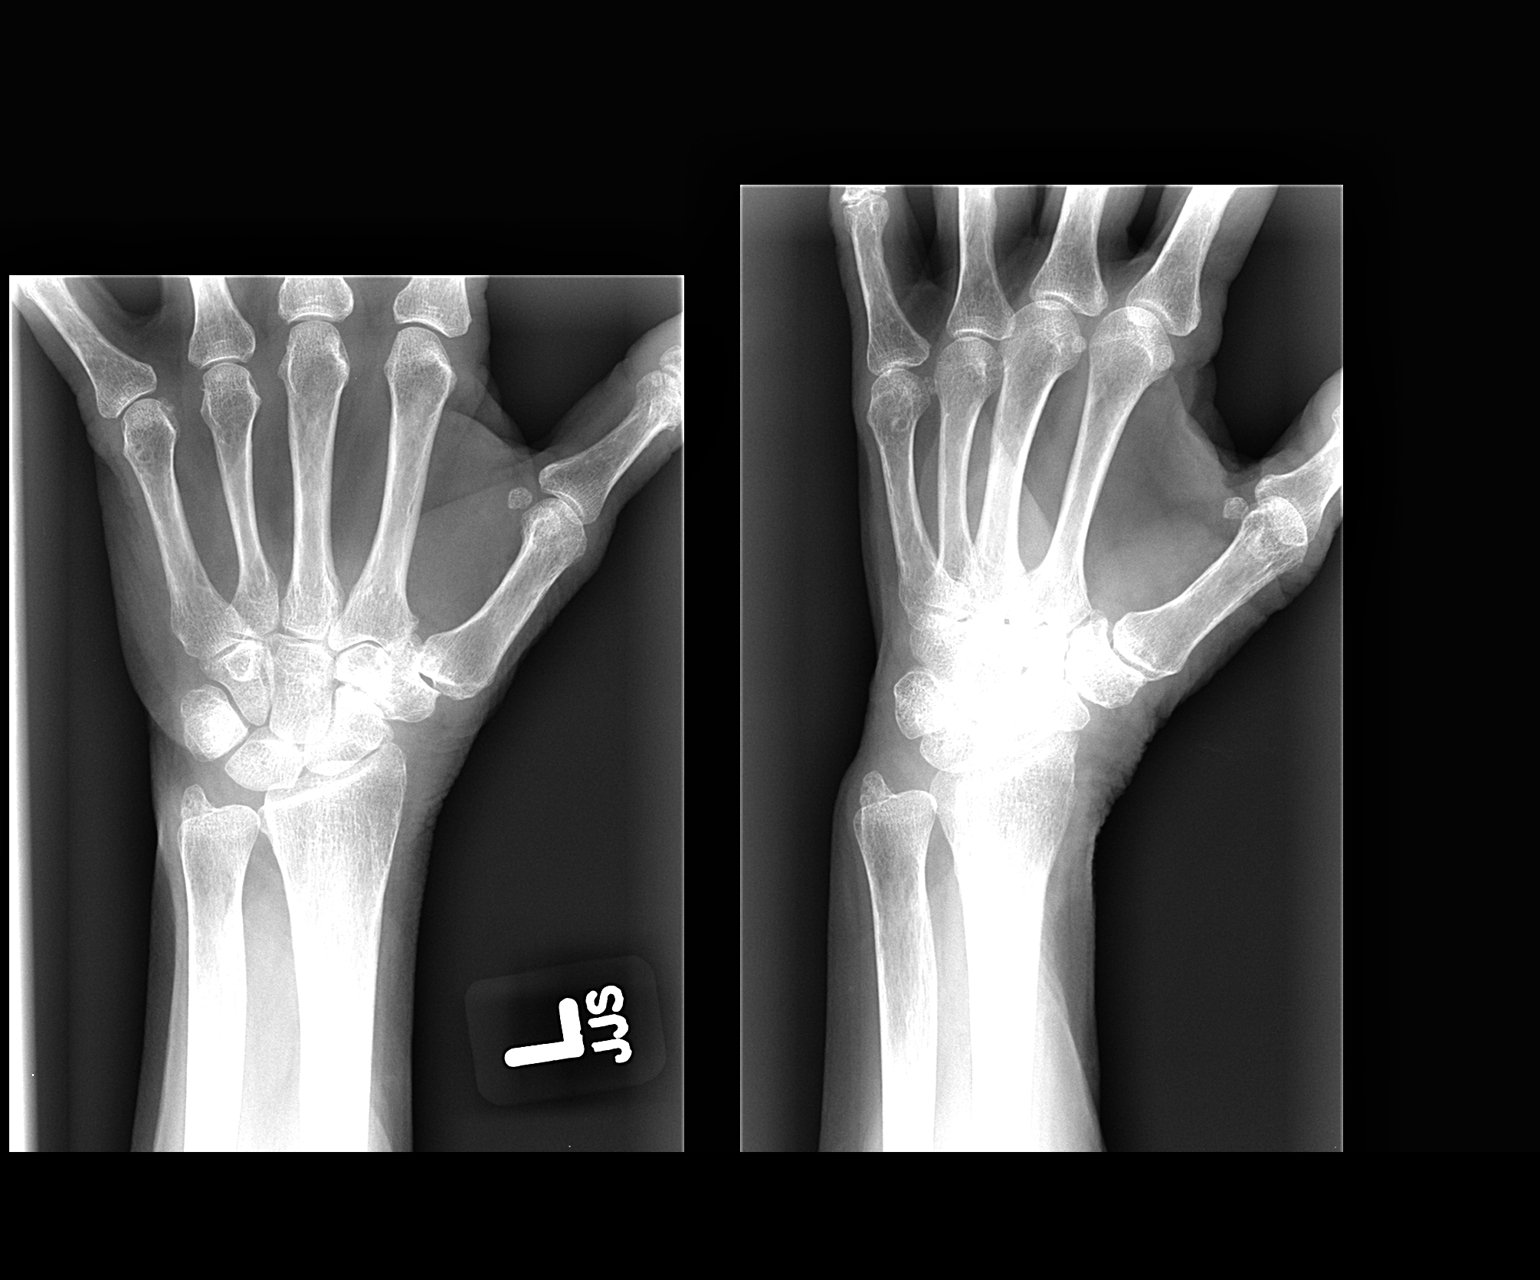

[view not recorded (2 of 2)]
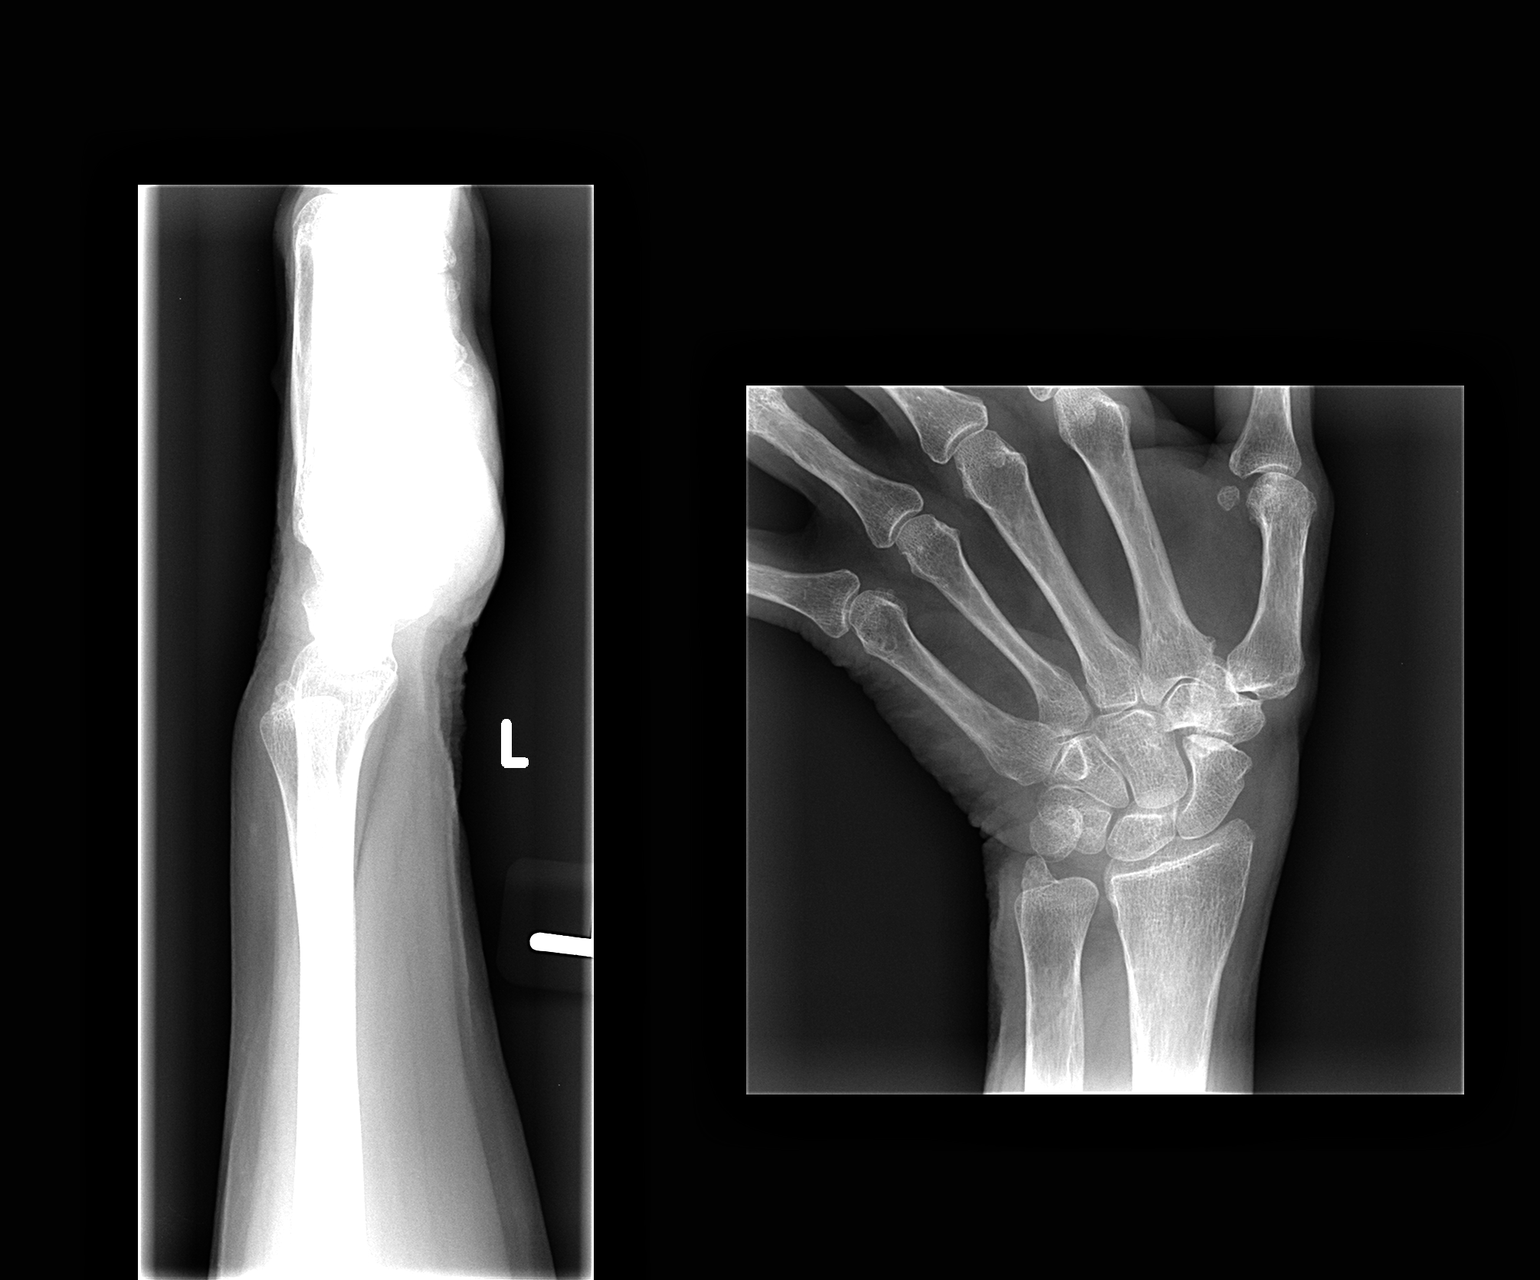

[2 of 2 positions shown; findings below may reference images not displayed]

FINDINGS: No acute bony abnormality.  Specifically, no fracture,
subluxation, or dislocation.  Soft tissues are intact.  No visible
scaphoid fracture.  Mild degenerative changes in the first carpal
metacarpal joint.
IMPRESSION: No acute bony abnormality.

## 2014-06-12 ENCOUNTER — Telehealth: Payer: Self-pay | Admitting: Internal Medicine

## 2014-07-03 ENCOUNTER — Encounter: Payer: Self-pay | Admitting: Internal Medicine

## 2014-08-30 NOTE — Telephone Encounter (Signed)
error 

## 2014-10-11 ENCOUNTER — Encounter: Payer: Commercial Managed Care - HMO | Admitting: Internal Medicine

## 2014-10-23 ENCOUNTER — Telehealth: Payer: Self-pay | Admitting: Internal Medicine

## 2014-10-23 NOTE — Telephone Encounter (Signed)
Pt is due for her Prolia injection. Can you start the approval process? Call patient on mobile # first, then home #.

## 2014-10-24 ENCOUNTER — Encounter: Payer: Commercial Managed Care - HMO | Admitting: Internal Medicine

## 2014-11-01 NOTE — Telephone Encounter (Signed)
I have sent pt's info for Prolia insurance verification and will notify you once I have a response. Thank you. °

## 2014-11-02 ENCOUNTER — Other Ambulatory Visit: Payer: Self-pay | Admitting: Internal Medicine

## 2014-11-02 DIAGNOSIS — M1711 Unilateral primary osteoarthritis, right knee: Secondary | ICD-10-CM

## 2014-11-02 DIAGNOSIS — Z124 Encounter for screening for malignant neoplasm of cervix: Secondary | ICD-10-CM

## 2014-11-07 DIAGNOSIS — M19041 Primary osteoarthritis, right hand: Secondary | ICD-10-CM | POA: Diagnosis not present

## 2014-11-07 DIAGNOSIS — M170 Bilateral primary osteoarthritis of knee: Secondary | ICD-10-CM | POA: Diagnosis not present

## 2014-11-15 DIAGNOSIS — M170 Bilateral primary osteoarthritis of knee: Secondary | ICD-10-CM | POA: Diagnosis not present

## 2014-11-16 NOTE — Telephone Encounter (Signed)
Pt's [employer] MCR is requiring a prior auth for Prolia.  I have completed part of the [facility] p/a form, but there are sections that need to be completed by Dr. Asa Lente, then signed.  Once it is completed/signed, please return to me via fax [phone].  Can I fax this to your attn at [phone] to pass on to Dr. Asa Lente? Thank you.

## 2014-11-16 NOTE — Telephone Encounter (Signed)
Can you send to fax [phone]? It will reach me much faster.

## 2014-11-16 NOTE — Telephone Encounter (Signed)
Yes ma'am. I just sent to your attn. Thank you.

## 2014-11-16 NOTE — Telephone Encounter (Signed)
I have it and will send back as soon as I can.

## 2014-11-17 ENCOUNTER — Telehealth: Payer: Self-pay

## 2014-11-17 NOTE — Telephone Encounter (Signed)
Left message for pt to call back if she still wants flu vaccine 

## 2014-11-21 ENCOUNTER — Encounter: Payer: Self-pay | Admitting: Internal Medicine

## 2014-11-21 ENCOUNTER — Ambulatory Visit (INDEPENDENT_AMBULATORY_CARE_PROVIDER_SITE_OTHER): Payer: Commercial Managed Care - HMO | Admitting: Internal Medicine

## 2014-11-21 VITALS — BP 110/64 | HR 72 | Temp 97.9°F | Resp 14 | Ht 64.0 in | Wt 144.6 lb

## 2014-11-21 DIAGNOSIS — Z0000 Encounter for general adult medical examination without abnormal findings: Secondary | ICD-10-CM | POA: Diagnosis not present

## 2014-11-21 DIAGNOSIS — M810 Age-related osteoporosis without current pathological fracture: Secondary | ICD-10-CM

## 2014-11-21 DIAGNOSIS — Z418 Encounter for other procedures for purposes other than remedying health state: Secondary | ICD-10-CM | POA: Diagnosis not present

## 2014-11-21 DIAGNOSIS — Z23 Encounter for immunization: Secondary | ICD-10-CM

## 2014-11-21 DIAGNOSIS — M1710 Unilateral primary osteoarthritis, unspecified knee: Secondary | ICD-10-CM

## 2014-11-21 DIAGNOSIS — IMO0002 Reserved for concepts with insufficient information to code with codable children: Secondary | ICD-10-CM

## 2014-11-21 DIAGNOSIS — Z299 Encounter for prophylactic measures, unspecified: Secondary | ICD-10-CM

## 2014-11-21 DIAGNOSIS — M25561 Pain in right knee: Secondary | ICD-10-CM

## 2014-11-21 NOTE — Patient Instructions (Signed)
We have given you the pneumonia booster shot which protects you against 13 more strains of pneumonia.  We will check the blood work and call you back with the results. We will work on the prolia and will check another bone density scan.  Health Maintenance Adopting a healthy lifestyle and getting preventive care can go a long way to promote health and wellness. Talk with your health care provider about what schedule of regular examinations is right for you. This is a good chance for you to check in with your provider about disease prevention and staying healthy. In between checkups, there are plenty of things you can do on your own. Experts have done a lot of research about which lifestyle changes and preventive measures are most likely to keep you healthy. Ask your health care provider for more information. WEIGHT AND DIET  Eat a healthy diet  Be sure to include plenty of vegetables, fruits, low-fat dairy products, and lean protein.  Do not eat a lot of foods high in solid fats, added sugars, or salt.  Get regular exercise. This is one of the most important things you can do for your health.  Most adults should exercise for at least 150 minutes each week. The exercise should increase your heart rate and make you sweat (moderate-intensity exercise).  Most adults should also do strengthening exercises at least twice a week. This is in addition to the moderate-intensity exercise.  Maintain a healthy weight  Body mass index (BMI) is a measurement that can be used to identify possible weight problems. It estimates body fat based on height and weight. Your health care provider can help determine your BMI and help you achieve or maintain a healthy weight.  For females 70 years of age and older:   A BMI below 18.5 is considered underweight.  A BMI of 18.5 to 24.9 is normal.  A BMI of 25 to 29.9 is considered overweight.  A BMI of 30 and above is considered obese.  Watch levels of  cholesterol and blood lipids  You should start having your blood tested for lipids and cholesterol at 70 years of age, then have this test every 5 years.  You may need to have your cholesterol levels checked more often if:  Your lipid or cholesterol levels are high.  You are older than 70 years of age.  You are at high risk for heart disease.  CANCER SCREENING   Lung Cancer  Lung cancer screening is recommended for adults 21-70 years old who are at high risk for lung cancer because of a history of smoking.  A yearly low-dose CT scan of the lungs is recommended for people who:  Currently smoke.  Have quit within the past 15 years.  Have at least a 30-pack-year history of smoking. A pack year is smoking an average of one pack of cigarettes a day for 1 year.  Yearly screening should continue until it has been 15 years since you quit.  Yearly screening should stop if you develop a health problem that would prevent you from having lung cancer treatment.  Breast Cancer  Practice breast self-awareness. This means understanding how your breasts normally appear and feel.  It also means doing regular breast self-exams. Let your health care provider know about any changes, no matter how small.  If you are in your 70s or 30s, you should have a clinical breast exam (CBE) by a health care provider every 1-3 years as part of a regular health  exam.  If you are 70 or older, have a CBE every year. Also consider having a breast X-ray (mammogram) every year.  If you have a family history of breast cancer, talk to your health care provider about genetic screening.  If you are at high risk for breast cancer, talk to your health care provider about having an MRI and a mammogram every year.  Breast cancer gene (BRCA) assessment is recommended for women who have family members with BRCA-related cancers. BRCA-related cancers include:  Breast.  Ovarian.  Tubal.  Peritoneal  cancers.  Results of the assessment will determine the need for genetic counseling and BRCA1 and BRCA2 testing. Cervical Cancer Routine pelvic examinations to screen for cervical cancer are no longer recommended for nonpregnant women who are considered low risk for cancer of the pelvic organs (ovaries, uterus, and vagina) and who do not have symptoms. A pelvic examination may be necessary if you have symptoms including those associated with pelvic infections. Ask your health care provider if a screening pelvic exam is right for you.   The Pap test is the screening test for cervical cancer for women who are considered at risk.  If you had a hysterectomy for a problem that was not cancer or a condition that could lead to cancer, then you no longer need Pap tests.  If you are older than 65 years, and you have had normal Pap tests for the past 10 years, you no longer need to have Pap tests.  If you have had past treatment for cervical cancer or a condition that could lead to cancer, you need Pap tests and screening for cancer for at least 20 years after your treatment.  If you no longer get a Pap test, assess your risk factors if they change (such as having a new sexual partner). This can affect whether you should start being screened again.  Some women have medical problems that increase their chance of getting cervical cancer. If this is the case for you, your health care provider may recommend more frequent screening and Pap tests.  The human papillomavirus (HPV) test is another test that may be used for cervical cancer screening. The HPV test looks for the virus that can cause cell changes in the cervix. The cells collected during the Pap test can be tested for HPV.  The HPV test can be used to screen women 70 years of age and older. Getting tested for HPV can extend the interval between normal Pap tests from three to five years.  An HPV test also should be used to screen women of any age who  have unclear Pap test results.  After 70 years of age, women should have HPV testing as often as Pap tests.  Colorectal Cancer  This type of cancer can be detected and often prevented.  Routine colorectal cancer screening usually begins at 70 years of age and continues through 70 years of age.  Your health care provider may recommend screening at an earlier age if you have risk factors for colon cancer.  Your health care provider may also recommend using home test kits to check for hidden blood in the stool.  A small camera at the end of a tube can be used to examine your colon directly (sigmoidoscopy or colonoscopy). This is done to check for the earliest forms of colorectal cancer.  Routine screening usually begins at age 53.  Direct examination of the colon should be repeated every 5-10 years through 70 years of  age. However, you may need to be screened more often if early forms of precancerous polyps or small growths are found. Skin Cancer  Check your skin from head to toe regularly.  Tell your health care provider about any new moles or changes in moles, especially if there is a change in a mole's shape or color.  Also tell your health care provider if you have a mole that is larger than the size of a pencil eraser.  Always use sunscreen. Apply sunscreen liberally and repeatedly throughout the day.  Protect yourself by wearing long sleeves, pants, a wide-brimmed hat, and sunglasses whenever you are outside. HEART DISEASE, DIABETES, AND HIGH BLOOD PRESSURE   Have your blood pressure checked at least every 1-2 years. High blood pressure causes heart disease and increases the risk of stroke.  If you are between 32 years and 25 years old, ask your health care provider if you should take aspirin to prevent strokes.  Have regular diabetes screenings. This involves taking a blood sample to check your fasting blood sugar level.  If you are at a normal weight and have a low risk for  diabetes, have this test once every three years after 70 years of age.  If you are overweight and have a high risk for diabetes, consider being tested at a younger age or more often. PREVENTING INFECTION  Hepatitis B  If you have a higher risk for hepatitis B, you should be screened for this virus. You are considered at high risk for hepatitis B if:  You were born in a country where hepatitis B is common. Ask your health care provider which countries are considered high risk.  Your parents were born in a high-risk country, and you have not been immunized against hepatitis B (hepatitis B vaccine).  You have HIV or AIDS.  You use needles to inject street drugs.  You live with someone who has hepatitis B.  You have had sex with someone who has hepatitis B.  You get hemodialysis treatment.  You take certain medicines for conditions, including cancer, organ transplantation, and autoimmune conditions. Hepatitis C  Blood testing is recommended for:  Everyone born from [birth year] through 1966.  Anyone with known risk factors for hepatitis C. Sexually transmitted infections (STIs)  You should be screened for sexually transmitted infections (STIs) including gonorrhea and chlamydia if:  You are sexually active and are younger than 70 years of age.  You are older than 70 years of age and your health care provider tells you that you are at risk for this type of infection.  Your sexual activity has changed since you were last screened and you are at an increased risk for chlamydia or gonorrhea. Ask your health care provider if you are at risk.  If you do not have HIV, but are at risk, it may be recommended that you take a prescription medicine daily to prevent HIV infection. This is called pre-exposure prophylaxis (PrEP). You are considered at risk if:  You are sexually active and do not regularly use condoms or know the HIV status of your partner(s).  You take drugs by injection.  You are  sexually active with a partner who has HIV. Talk with your health care provider about whether you are at high risk of being infected with HIV. If you choose to begin PrEP, you should first be tested for HIV. You should then be tested every 3 months for as long as you are taking PrEP.  PREGNANCY  If you are premenopausal and you may become pregnant, ask your health care provider about preconception counseling.  If you may become pregnant, take 400 to 800 micrograms (mcg) of folic acid every day.  If you want to prevent pregnancy, talk to your health care provider about birth control (contraception). OSTEOPOROSIS AND MENOPAUSE   Osteoporosis is a disease in which the bones lose minerals and strength with aging. This can result in serious bone fractures. Your risk for osteoporosis can be identified using a bone density scan.  If you are 29 years of age or older, or if you are at risk for osteoporosis and fractures, ask your health care provider if you should be screened.  Ask your health care provider whether you should take a calcium or vitamin D supplement to lower your risk for osteoporosis.  Menopause may have certain physical symptoms and risks.  Hormone replacement therapy may reduce some of these symptoms and risks. Talk to your health care provider about whether hormone replacement therapy is right for you.  HOME CARE INSTRUCTIONS   Schedule regular health, dental, and eye exams.  Stay current with your immunizations.   Do not use any tobacco products including cigarettes, chewing tobacco, or electronic cigarettes.  If you are pregnant, do not drink alcohol.  If you are breastfeeding, limit how much and how often you drink alcohol.  Limit alcohol intake to no more than 1 drink per day for nonpregnant women. One drink equals 12 ounces of beer, 5 ounces of wine, or 1 ounces of hard liquor.  Do not use street drugs.  Do not share needles.  Ask your health care provider for  help if you need support or information about quitting drugs.  Tell your health care provider if you often feel depressed.  Tell your health care provider if you have ever been abused or do not feel safe at home. Document Released: 03/03/2011 Document Revised: 01/02/2014 Document Reviewed: 07/20/2013 [facility] Patient Information 2015 [place], [place]. This information is not intended to replace advice given to you by your health care provider. Make sure you discuss any questions you have with your health care provider.

## 2014-11-21 NOTE — Progress Notes (Signed)
Pre visit review using our clinic review tool, if applicable. No additional management support is needed unless otherwise documented below in the visit note. 

## 2014-11-22 ENCOUNTER — Other Ambulatory Visit (INDEPENDENT_AMBULATORY_CARE_PROVIDER_SITE_OTHER): Payer: Commercial Managed Care - HMO

## 2014-11-22 DIAGNOSIS — M810 Age-related osteoporosis without current pathological fracture: Secondary | ICD-10-CM | POA: Diagnosis not present

## 2014-11-22 DIAGNOSIS — R7989 Other specified abnormal findings of blood chemistry: Secondary | ICD-10-CM

## 2014-11-22 DIAGNOSIS — Z0000 Encounter for general adult medical examination without abnormal findings: Secondary | ICD-10-CM

## 2014-11-22 DIAGNOSIS — M170 Bilateral primary osteoarthritis of knee: Secondary | ICD-10-CM | POA: Diagnosis not present

## 2014-11-22 LAB — COMPREHENSIVE METABOLIC PANEL
ALT: 12 U/L (ref 0–35)
AST: 18 U/L (ref 0–37)
Albumin: 3.9 g/dL (ref 3.5–5.2)
Alkaline Phosphatase: 42 U/L (ref 39–117)
BILIRUBIN TOTAL: 0.4 mg/dL (ref 0.2–1.2)
BUN: 8 mg/dL (ref 6–23)
CALCIUM: 8.8 mg/dL (ref 8.4–10.5)
CHLORIDE: 105 meq/L (ref 96–112)
CO2: 29 meq/L (ref 19–32)
Creatinine, Ser: 0.86 mg/dL (ref 0.40–1.20)
GFR: 69.46 mL/min (ref >60.00)
Glucose, Bld: 84 mg/dL (ref 70–99)
Potassium: 3.9 mEq/L (ref 3.5–5.1)
SODIUM: 138 meq/L (ref 135–145)
TOTAL PROTEIN: 6.7 g/dL (ref 6.0–8.3)

## 2014-11-22 LAB — LIPID PANEL
CHOLESTEROL: 307 mg/dL — AB (ref 0–200)
HDL: 62.9 mg/dL (ref >39.00)
NonHDL: 244.1
TRIGLYCERIDES: 253 mg/dL — AB (ref 0.0–149.0)
Total CHOL/HDL Ratio: 5
VLDL: 50.6 mg/dL — AB (ref 0.0–40.0)

## 2014-11-22 LAB — TSH: TSH: 1.15 u[IU]/mL (ref 0.35–4.50)

## 2014-11-22 LAB — LDL CHOLESTEROL, DIRECT: Direct LDL: 94 mg/dL

## 2014-11-23 ENCOUNTER — Encounter: Payer: Self-pay | Admitting: Internal Medicine

## 2014-11-23 DIAGNOSIS — Z0000 Encounter for general adult medical examination without abnormal findings: Secondary | ICD-10-CM | POA: Insufficient documentation

## 2014-11-23 NOTE — Assessment & Plan Note (Signed)
Referral placed to orthopedics per her insurance requirement of new referral every year.

## 2014-11-23 NOTE — Assessment & Plan Note (Signed)
Given prevnar 13 at today's visit to complete pneumonia series. Flu declined this season, shingles declined. Colonoscopy due in 2018, mammogram done late last year. Tetanus up to date. Given list of screening for 10+ years and talked to her about sun safety and reminded her to cover when outside and apply sunscreen every 4-6 hours while in direct sunlight. Home safety she is very good on and no falls. Depression screen negative. Non-smoker.

## 2014-11-23 NOTE — Progress Notes (Signed)
   Subjective:    Patient ID: Leah Jensen, female    DOB: 1946-03-24, 69 y.o.   MRN: [id number]  HPI Here for medicare wellness, no new complaints or problems today.   Diet: mediocre Physical activity: sedentary Depression/mood screen: negative Hearing: intact to whispered voice Visual acuity: grossly normal, performs annual eye exam  ADLs: capable Fall risk: none Home safety: good Cognitive evaluation: intact to orientation, naming, recall and repetition EOL planning: adv directives discussed and she will think about it  I have personally reviewed and have noted 1. The patient's medical and social history -reviewed no changes today 2. Their use of alcohol, tobacco or illicit drugs 3. Their current medications and supplements 4. The patient's functional ability including ADL's, fall risks, home safety risks and hearing or visual impairment. 5. Diet and physical activities 6. Evidence for depression or mood disorders 7. Care team reviewed and updated (available in snapshot)  Review of Systems  Constitutional: Negative for chills, activity change, appetite change, fatigue and unexpected weight change.  HENT: Negative.   Respiratory: Negative for cough, chest tightness, shortness of breath and wheezing.   Cardiovascular: Negative for chest pain, palpitations and leg swelling.  Gastrointestinal: Negative.   Endocrine: Negative.   Musculoskeletal: Positive for myalgias and arthralgias.  Skin: Negative.   Neurological: Negative.   Psychiatric/Behavioral: Negative.       Objective:   Physical Exam  Constitutional: She is oriented to person, place, and time. She appears well-developed and well-nourished.  HENT:  Head: Normocephalic and atraumatic.  Eyes: EOM are normal.  Neck: Normal range of motion.  Cardiovascular: Normal rate and regular rhythm.   Pulmonary/Chest: Effort normal and breath sounds normal.  Abdominal: Soft.  Neurological: She is alert and oriented to  person, place, and time. Coordination normal.  Skin: Skin is warm and dry.  Psychiatric: She has a normal mood and affect.   Filed Vitals:   11/21/14 1510  BP: 110/64  Pulse: 72  Temp: 97.9 F (36.6 C)  TempSrc: Oral  Resp: 14  Height: 5\' 4"  (1.626 m)  Weight: 144 lb 9.6 oz (65.59 kg)  SpO2: 97%      Assessment & Plan:  Given prevnar 13 during visit.

## 2014-11-23 NOTE — Assessment & Plan Note (Signed)
On prolia and stop date in 2018. Talked with her about weight bearing exercise. Due for bone density.

## 2014-11-27 DIAGNOSIS — M810 Age-related osteoporosis without current pathological fracture: Secondary | ICD-10-CM | POA: Diagnosis not present

## 2014-11-27 DIAGNOSIS — N951 Menopausal and female climacteric states: Secondary | ICD-10-CM | POA: Diagnosis not present

## 2014-11-27 DIAGNOSIS — R921 Mammographic calcification found on diagnostic imaging of breast: Secondary | ICD-10-CM | POA: Diagnosis not present

## 2014-11-28 NOTE — Telephone Encounter (Signed)
Fax number

## 2014-11-28 NOTE — Telephone Encounter (Signed)
Form filled out and ready for MD to sign.

## 2014-11-28 NOTE — Telephone Encounter (Signed)
[phone], thank you.

## 2014-12-01 NOTE — Telephone Encounter (Signed)
I have sent the completed [employer] p/a form, clinicals from [place] 03/22/2013, and bone density results from 11/11/2012 to [facility] and will notify you once I have a response. Thank you.

## 2014-12-06 DIAGNOSIS — M6751 Plica syndrome, right knee: Secondary | ICD-10-CM | POA: Diagnosis not present

## 2014-12-11 NOTE — Telephone Encounter (Signed)
[employer] sent authorization [id number] and is effective 04/10/2014 to 04/10/2016 for Prolia.  Leah Jensen estimated responsibility will be a 20% co-insurance for Prolia plus a $45 co-pay for the admin.  This means her estimated responsibility will be $225.  Please let her know this is an estimate and we will not know an exact amt until her insurance has paid.  I have sent a copy of the summary of benefits and the [facility] authorization to be scanned into her chart.  If Leah Jensen cannot afford $225 for her injection, please advise her to contact Prolia at [phone] to see if she qualifies for one of their assistance programs.  If she qualifies they will instruct her how to proceed.  If you have any questions, please let me know. Thank you.

## 2014-12-12 NOTE — Telephone Encounter (Signed)
LVM for pt to call back.

## 2014-12-13 ENCOUNTER — Ambulatory Visit (INDEPENDENT_AMBULATORY_CARE_PROVIDER_SITE_OTHER)
Admission: RE | Admit: 2014-12-13 | Discharge: 2014-12-13 | Disposition: A | Discharge status: Home / Self Care | Payer: Commercial Managed Care - HMO | Source: Ambulatory Visit | Attending: Internal Medicine | Admitting: Internal Medicine

## 2014-12-13 ENCOUNTER — Other Ambulatory Visit: Payer: Commercial Managed Care - HMO

## 2014-12-13 DIAGNOSIS — M810 Age-related osteoporosis without current pathological fracture: Secondary | ICD-10-CM

## 2014-12-13 NOTE — Telephone Encounter (Signed)
Spoke to pt and gave information below.  Injection scheduled.   Can we order a shot for this pt?  [phone] is pt cell number.

## 2014-12-25 ENCOUNTER — Ambulatory Visit: Payer: Commercial Managed Care - HMO

## 2014-12-26 ENCOUNTER — Ambulatory Visit (INDEPENDENT_AMBULATORY_CARE_PROVIDER_SITE_OTHER): Payer: Commercial Managed Care - HMO

## 2014-12-26 ENCOUNTER — Telehealth: Payer: Self-pay

## 2014-12-26 DIAGNOSIS — M810 Age-related osteoporosis without current pathological fracture: Secondary | ICD-10-CM | POA: Diagnosis not present

## 2014-12-26 MED ORDER — DENOSUMAB 60 MG/ML ~~LOC~~ SOLN
60.0000 mg | Freq: Once | SUBCUTANEOUS | Status: AC
  Administered 2014-12-26: 60 mg via SUBCUTANEOUS

## 2014-12-26 NOTE — Telephone Encounter (Signed)
Advised patient of dr Jeraldine Loots note about prolia results and to continue vit d, calcium and weight bearing exercises

## 2014-12-26 NOTE — Telephone Encounter (Signed)
We generally wait about 1 year to check after starting prolia shots and would be this summer. She can keep taking the calcium and vitamin D daily. She is also doing good with weight bearing exercise to remind the bones to make new bone.

## 2014-12-26 NOTE — Telephone Encounter (Signed)
Patient came in to office today for prolia shot---i advised her of your note from 4/21 saying that she does have some thinning of bones (osteoperosis) and try taking vit D and Calcium or you would be glad to talk with her about other medicines she can take for this condition----patient states this will be her 2nd prolia shot---but she was late with getting this 2nd shot (should be q 6 months and it is now 9 months later).  Patient also states she already has been taking vit d and calcium for several years and she is wanting to know how long should she wait to see results from prolia shots----she would like to be advised when to check for those results and then may possibly need to try other meds if prolia doesn't work either.  Please advise and i will

## 2015-02-15 ENCOUNTER — Ambulatory Visit: Payer: Commercial Managed Care - HMO | Admitting: Internal Medicine

## 2015-02-20 ENCOUNTER — Ambulatory Visit: Payer: Commercial Managed Care - HMO | Admitting: Internal Medicine

## 2015-03-27 ENCOUNTER — Other Ambulatory Visit (INDEPENDENT_AMBULATORY_CARE_PROVIDER_SITE_OTHER): Payer: Commercial Managed Care - HMO

## 2015-03-27 ENCOUNTER — Ambulatory Visit (INDEPENDENT_AMBULATORY_CARE_PROVIDER_SITE_OTHER): Payer: Commercial Managed Care - HMO | Admitting: Internal Medicine

## 2015-03-27 ENCOUNTER — Encounter: Payer: Self-pay | Admitting: Internal Medicine

## 2015-03-27 VITALS — BP 110/66 | HR 71 | Temp 98.4°F | Resp 16

## 2015-03-27 DIAGNOSIS — I872 Venous insufficiency (chronic) (peripheral): Secondary | ICD-10-CM

## 2015-03-27 DIAGNOSIS — R0989 Other specified symptoms and signs involving the circulatory and respiratory systems: Secondary | ICD-10-CM | POA: Insufficient documentation

## 2015-03-27 DIAGNOSIS — R198 Other specified symptoms and signs involving the digestive system and abdomen: Secondary | ICD-10-CM | POA: Diagnosis not present

## 2015-03-27 LAB — BRAIN NATRIURETIC PEPTIDE: PRO B NATRI PEPTIDE: 165 pg/mL — AB (ref 0.0–100.0)

## 2015-03-27 MED ORDER — DICLOFENAC SODIUM 75 MG PO TBEC: 75.0000 mg | DELAYED_RELEASE_TABLET | Freq: Two times a day (BID) | ORAL | Status: DC

## 2015-03-27 NOTE — Progress Notes (Signed)
Pre visit review using our clinic review tool, if applicable. No additional management support is needed unless otherwise documented below in the visit note. 

## 2015-03-27 NOTE — Assessment & Plan Note (Signed)
Referral to ENT for evaluation of need for removal. Causing her problems with food getting stuck and ruminating if she does not clear it.

## 2015-03-27 NOTE — Progress Notes (Signed)
   Subjective:    Patient ID: Leah Jensen, female    DOB: 03-28-1946, 69 y.o.   MRN: [id number]  HPI The patient is a 69 YO female with several acute complaints. She is having a lot of problems with her tonsils. She had many infections as a child and they were going to take them out but never did. She now gets food and things caught in her tonsils and she has to use a q-tip to push out the contents. Her right tonsil for the last 1 month has been swelling and nothing will come out except mild blood. Getting some pain in the right ear and right neck in that area. No fevers, chills, sore throat, drainage.  Next problem is swelling in her legs. Going on for some time but she has never sought treatment. Both legs equally and gone in the morning. She has tried compression stockings in the past and the fluid always comes on top of the stocking. Does not feel able to elevate her legs enough to help. No recent long travel and no asymmetry.   Review of Systems  Constitutional: Negative.   HENT: Negative for sore throat.        Tonsil problem  Respiratory: Negative for cough, chest tightness, shortness of breath and wheezing.   Cardiovascular: Positive for leg swelling. Negative for chest pain and palpitations.  Skin: Negative.   Neurological: Negative.   Psychiatric/Behavioral: Positive for dysphoric mood and decreased concentration. Negative for suicidal ideas and self-injury. The patient is not nervous/anxious.       Objective:   Physical Exam  Constitutional: She is oriented to person, place, and time. She appears well-developed and well-nourished.  HENT:  Head: Normocephalic and atraumatic.  Right tonsil more swollen that left, on top end of normal. No drainage visible and no pus.   Eyes: EOM are normal.  Cardiovascular: Normal rate and regular rhythm.   Pulmonary/Chest: Effort normal.  Abdominal: Soft. She exhibits no distension. There is no tenderness.  Musculoskeletal: She exhibits  edema.  1+ edema bilaterally  Lymphadenopathy:    She has no cervical adenopathy.  Neurological: She is alert and oriented to person, place, and time.  Skin: Skin is warm and dry.   Filed Vitals:   03/27/15 1319  BP: 110/66  Pulse: 71  Temp: 98.4 F (36.9 C)  TempSrc: Oral  Resp: 16  SpO2: 97%      Assessment & Plan:

## 2015-03-27 NOTE — Patient Instructions (Signed)
We are going to check the blood test today to make sure that the heart is not the cause of the problems with the fluid.   We will also send you to the ear, nose and throat doctor to make sure that they do not need to do anything with the tonsils to help stop all the problems you are having with them.   I'm sorry about all the hard times you've been having lately. I know it's hard to be motivated to do things but work on trying to do some things that make you happy too.   Stress and Stress Management Stress is a normal reaction to life events. It is what you feel when life demands more than you are used to or more than you can handle. Some stress can be useful. For example, the stress reaction can help you catch the last bus of the day, study for a test, or meet a deadline at work. But stress that occurs too often or for too long can cause problems. It can affect your emotional health and interfere with relationships and normal daily activities. Too much stress can weaken your immune system and increase your risk for physical illness. If you already have a medical problem, stress can make it worse. CAUSES  All sorts of life events may cause stress. An event that causes stress for one person may not be stressful for another person. Major life events commonly cause stress. These may be positive or negative. Examples include losing your job, moving into a new home, getting married, having a baby, or losing a loved one. Less obvious life events may also cause stress, especially if they occur day after day or in combination. Examples include working long hours, driving in traffic, caring for children, being in debt, or being in a difficult relationship. SIGNS AND SYMPTOMS Stress may cause emotional symptoms including, the following:  Anxiety. This is feeling worried, afraid, on edge, overwhelmed, or out of control.  Anger. This is feeling irritated or impatient.  Depression. This is feeling sad, down,  helpless, or guilty.  Difficulty focusing, remembering, or making decisions. Stress may cause physical symptoms, including the following:   Aches and pains. These may affect your head, neck, back, stomach, or other areas of your body.  Tight muscles or clenched jaw.  Low energy or trouble sleeping. Stress may cause unhealthy behaviors, including the following:   Eating to feel better (overeating) or skipping meals.  Sleeping too little, too much, or both.  Working too much or putting off tasks (procrastination).  Smoking, drinking alcohol, or using drugs to feel better. DIAGNOSIS  Stress is diagnosed through an assessment by your health care provider. Your health care provider will ask questions about your symptoms and any stressful life events.Your health care provider will also ask about your medical history and may order blood tests or other tests. Certain medical conditions and medicine can cause physical symptoms similar to stress. Mental illness can cause emotional symptoms and unhealthy behaviors similar to stress. Your health care provider may refer you to a mental health professional for further evaluation.  TREATMENT  Stress management is the recommended treatment for stress.The goals of stress management are reducing stressful life events and coping with stress in healthy ways.  Techniques for reducing stressful life events include the following:  Stress identification. Self-monitor for stress and identify what causes stress for you. These skills may help you to avoid some stressful events.  Time management. Set your priorities, keep  a calendar of events, and learn to say "no." These tools can help you avoid making too many commitments. Techniques for coping with stress include the following:  Rethinking the problem. Try to think realistically about stressful events rather than ignoring them or overreacting. Try to find the positives in a stressful situation rather than  focusing on the negatives.  Exercise. Physical exercise can release both physical and emotional tension. The key is to find a form of exercise you enjoy and do it regularly.  Relaxation techniques. These relax the body and mind. Examples include yoga, meditation, tai chi, biofeedback, deep breathing, progressive muscle relaxation, listening to music, being out in nature, journaling, and other hobbies. Again, the key is to find one or more that you enjoy and can do regularly.  Healthy lifestyle. Eat a balanced diet, get plenty of sleep, and do not smoke. Avoid using alcohol or drugs to relax.  Strong support network. Spend time with family, friends, or other people you enjoy being around.Express your feelings and talk things over with someone you trust. Counseling or talktherapy with a mental health professional may be helpful if you are having difficulty managing stress on your own. Medicine is typically not recommended for the treatment of stress.Talk to your health care provider if you think you need medicine for symptoms of stress. HOME CARE INSTRUCTIONS  Keep all follow-up visits as directed by your health care provider.  Take all medicines as directed by your health care provider. SEEK MEDICAL CARE IF:  Your symptoms get worse or you start having new symptoms.  You feel overwhelmed by your problems and can no longer manage them on your own. SEEK IMMEDIATE MEDICAL CARE IF:  You feel like hurting yourself or someone else. Document Released: 02/11/2001 Document Revised: 01/02/2014 Document Reviewed: 04/12/2013 [facility] Patient Information 2015 [place], [place]. This information is not intended to replace advice given to you by your health care provider. Make sure you discuss any questions you have with your health care provider.

## 2015-03-27 NOTE — Assessment & Plan Note (Signed)
Checking pro BNP today. Reviewed CMP and no kidney or liver problems. No asymmetry and no indication for venous US.

## 2015-03-28 ENCOUNTER — Other Ambulatory Visit: Payer: Self-pay | Admitting: Internal Medicine

## 2015-03-28 ENCOUNTER — Telehealth: Payer: Self-pay | Admitting: Internal Medicine

## 2015-03-28 DIAGNOSIS — R0600 Dyspnea, unspecified: Secondary | ICD-10-CM

## 2015-03-28 NOTE — Telephone Encounter (Signed)
Patient was cut off when she talked with you earlier.  Please call her back. Her cell is [phone]

## 2015-03-28 NOTE — Telephone Encounter (Signed)
Spoke with patient and discussed lab results.  

## 2015-04-02 ENCOUNTER — Other Ambulatory Visit: Payer: Self-pay

## 2015-04-02 ENCOUNTER — Ambulatory Visit (HOSPITAL_COMMUNITY): Payer: Commercial Managed Care - HMO | Attending: Cardiovascular Disease

## 2015-04-02 DIAGNOSIS — R0600 Dyspnea, unspecified: Secondary | ICD-10-CM | POA: Insufficient documentation

## 2015-04-02 DIAGNOSIS — Z8249 Family history of ischemic heart disease and other diseases of the circulatory system: Secondary | ICD-10-CM | POA: Diagnosis not present

## 2015-04-04 ENCOUNTER — Encounter: Payer: Self-pay | Admitting: Internal Medicine

## 2015-04-04 ENCOUNTER — Ambulatory Visit (INDEPENDENT_AMBULATORY_CARE_PROVIDER_SITE_OTHER): Payer: Commercial Managed Care - HMO | Admitting: Internal Medicine

## 2015-04-04 VITALS — BP 98/68 | HR 68 | Temp 97.8°F | Resp 14

## 2015-04-04 DIAGNOSIS — I872 Venous insufficiency (chronic) (peripheral): Secondary | ICD-10-CM

## 2015-04-04 NOTE — Patient Instructions (Signed)
We have given you the copy of the echocardiogram which was normal.

## 2015-04-04 NOTE — Progress Notes (Signed)
Pre visit review using our clinic review tool, if applicable. No additional management support is needed unless otherwise documented below in the visit note. 

## 2015-04-05 NOTE — Assessment & Plan Note (Signed)
Due to mildly elevated BNP after last visit TTE was ordered and discussed the normal results with the patient today. Talked again about elevation and compression for her swelling.

## 2015-04-05 NOTE — Progress Notes (Signed)
   Subjective:    Patient ID: Leah Jensen, female    DOB: Jan 25, 1946, 69 y.o.   MRN: [id number]  HPI The patient is a 69 YO female who is coming in for her echocardiogram results. She is doing the same, no SOB. Still with some swelling in her ankles. She was concerned about this test but reassured her that it came back normal.   Review of Systems  Constitutional: Negative.   HENT:       Tonsil problem  Respiratory: Negative for cough, chest tightness, shortness of breath and wheezing.   Cardiovascular: Positive for leg swelling. Negative for chest pain and palpitations.  Skin: Negative.       Objective:   Physical Exam  Constitutional: She is oriented to person, place, and time. She appears well-developed and well-nourished.  HENT:  Head: Normocephalic and atraumatic.  Eyes: EOM are normal.  Neck: No JVD present.  Cardiovascular: Normal rate and regular rhythm.   Pulmonary/Chest: Effort normal.  Abdominal: Soft. She exhibits no distension. There is no tenderness.  Musculoskeletal: She exhibits edema.  1+ edema bilaterally  Neurological: She is alert and oriented to person, place, and time.  Skin: Skin is warm and dry.   Filed Vitals:   04/04/15 1031  BP: 98/68  Pulse: 68  Temp: 97.8 F (36.6 C)  TempSrc: Oral  Resp: 14  SpO2: 98%      Assessment & Plan:  Visit time 15 minutes, greater than 50% spent in counseling and coordination of care face to face with the patient: counseling about the results and interpretation of her recent 2D TTE.

## 2015-04-11 DIAGNOSIS — J358 Other chronic diseases of tonsils and adenoids: Secondary | ICD-10-CM | POA: Diagnosis not present

## 2015-04-12 NOTE — Progress Notes (Signed)
Pre visit review using our clinic review tool, if applicable. No additional management support is needed unless otherwise documented below in the visit note. 

## 2015-05-01 DIAGNOSIS — H2513 Age-related nuclear cataract, bilateral: Secondary | ICD-10-CM | POA: Diagnosis not present

## 2015-05-08 ENCOUNTER — Telehealth: Payer: Self-pay | Admitting: Internal Medicine

## 2015-05-08 ENCOUNTER — Other Ambulatory Visit (HOSPITAL_COMMUNITY): Payer: Self-pay | Admitting: Obstetrics and Gynecology

## 2015-05-08 DIAGNOSIS — Z1231 Encounter for screening mammogram for malignant neoplasm of breast: Secondary | ICD-10-CM

## 2015-05-08 NOTE — Telephone Encounter (Signed)
Patient is requesting [employer] referral to Dr. Jacqulyn Bath with [facility] for Kathi Der injections in knee.  Patient has not scheduled appointment yet.

## 2015-05-08 NOTE — Telephone Encounter (Signed)
Patient states she needs Prolia injection before end of September.  Can you please check with [employer] to get verification.

## 2015-05-10 NOTE — Telephone Encounter (Signed)
Pt's Prolia injection is actually not due until 06/28/2015 or after, but I have electronically submitted pt's info for [place] verification and will notify you once I have a response. Thank you.

## 2015-05-10 NOTE — Telephone Encounter (Signed)
Mcarthur Rossetti Josem Kaufmann #[id number] valid 05/10/2015 - 11/06/2015 for 6 visits.  Left message for patient to call office to make her aware.

## 2015-05-10 NOTE — Telephone Encounter (Signed)
Pt called back stating that the referral will be no good because her appointment is on the 15th. Can you please call her at [phone]

## 2015-05-17 DIAGNOSIS — M170 Bilateral primary osteoarthritis of knee: Secondary | ICD-10-CM | POA: Diagnosis not present

## 2015-05-17 NOTE — Telephone Encounter (Signed)
Patient called to follow up. i spoke to brittany about the following:  Dr. Jacqulyn Bath @ [facility] is wanting the patient to see someone else in the clinic.  She has to call and get a new auth from [place] for this. Advised patient that brittany is calling [facility] and then will get a new auth for her appt tomorrow. She will reach out to the patient at that point.

## 2015-05-18 DIAGNOSIS — M170 Bilateral primary osteoarthritis of knee: Secondary | ICD-10-CM | POA: Diagnosis not present

## 2015-05-21 DIAGNOSIS — F329 Major depressive disorder, single episode, unspecified: Secondary | ICD-10-CM | POA: Diagnosis not present

## 2015-05-21 NOTE — Telephone Encounter (Signed)
Pt wanted to make sure the prolia injection was ordered and will be in by her visit on the 29th?

## 2015-05-21 NOTE — Telephone Encounter (Signed)
Left message asking patient to call me back. I need to give her the information on the prolia injection. She will be financially responsible for a portion of the bill.

## 2015-05-21 NOTE — Telephone Encounter (Signed)
Pt has a [employer] p/a on file, [id number] which is effective 04/10/2014-04/10/2016.  Ms. Randa estimated responsibility will be a 20% co-insurance for Prolia plus a $45 co-pay for the admin.and OV, if billed. This means her estimated responsibility will be $225. Please let her know this is an estimate and we will not know an exact amt until her insurance has paid. I have sent a copy of the summary of benefits to be scanned into her chart.  Remind her she is not actually due the injection until 06/28/2015 or after and if she gets it prior to that, her insurance may not pay.  If Ms. Suppa cannot afford $225 for her injection, please advise her to contact Prolia at [phone] and select option #1 to see if she qualifies for one of their assistance programs. If she qualifies they will instruct her how to proceed.  Also, please send me the date she actually receives her injection so I can update the Prolia portal.  If you have any questions, please let me know.  Thank you.

## 2015-05-23 ENCOUNTER — Other Ambulatory Visit: Payer: Self-pay

## 2015-05-23 DIAGNOSIS — Z1231 Encounter for screening mammogram for malignant neoplasm of breast: Secondary | ICD-10-CM

## 2015-05-23 NOTE — Telephone Encounter (Signed)
Patient says the Patient Access Network normally covers the injection and that our office normally files it. I don't know anything about this. Please let me know if she is correct, so I can let her know is she will have to pay.

## 2015-05-24 NOTE — Telephone Encounter (Signed)
I am not familiar w/Patient Access Network.  Is that a part of her [employer]?  Also, when she had her injection back on 12/26/2014, the charges were never dropped so no insurance was filed and that would be why she never rec'd a statement for payment which probably makes her think it was covered at 100%.  I have sent a correction to drop those charges before timely filing gets here.  She will have to pay the $225 as stated below. If you have any questions, please let me know. Thank you.

## 2015-05-24 NOTE — Telephone Encounter (Signed)
Would you please call this patient and explain the process to her. She is very confused and I do not know much about billing. The best number to reach her is [phone].

## 2015-05-29 MED ORDER — DENOSUMAB 60 MG/ML ~~LOC~~ SOLN
60.0000 mg | Freq: Once | SUBCUTANEOUS | Status: AC
  Administered 2014-12-26: 60 mg via SUBCUTANEOUS

## 2015-05-30 NOTE — Telephone Encounter (Signed)
Left patient vm explaining Rose Brewers response.  Told patient to give me a call back to schedule.

## 2015-05-31 ENCOUNTER — Ambulatory Visit (HOSPITAL_COMMUNITY): Admission: RE | Admit: 2015-05-31 | Payer: Commercial Managed Care - HMO | Source: Ambulatory Visit

## 2015-05-31 ENCOUNTER — Ambulatory Visit: Payer: Commercial Managed Care - HMO | Admitting: Internal Medicine

## 2015-06-05 NOTE — Telephone Encounter (Signed)
I called and spoke to patient access network. They are an entity that covers copay responsibilities after [facility] pays. The patient will have to bill them, as this is technically a patient assistance program. They advised that they need the bill and the EOB from [facility]. The patient will have to take care of this.   Called and left a vm advising of this. Also stated that her next prolia has been approved. There was a prolia on 12/26/2014 that never dropped to charge due to an issue with the CMA giving it. That has been rectified, and the charge is currently going to [facility].

## 2015-06-08 ENCOUNTER — Other Ambulatory Visit: Payer: Self-pay | Admitting: Physician Assistant

## 2015-06-08 DIAGNOSIS — M170 Bilateral primary osteoarthritis of knee: Secondary | ICD-10-CM | POA: Diagnosis not present

## 2015-06-08 NOTE — H&P (Signed)
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 She has been seeing Dr.Voytek for this that has proceeded with numerous conservative treatment options to include oral anti-inflammatories, cortisone injections and viscosupplementation injections. All have failed to give her significant relief of her symptoms. She continues to complain of global pain. This has really been ongoing for the past 5-6 years and has progressively worsened.  She has no mechanical symptoms. However she does have quite a bit of instability. She is having some rest pain but nothing at night.  At this point she is ready to proceed with definitive treatment.   Past Medical, Family and Social History are reviewed in detail on patient questionnaire and signed. Review of Systems as detailed in HPI; all others reviewed and are negative.    EXAMINATION: Well-developed, well-nourished white female in no acute distress.  Alert and oriented x 3.  Examination of her right knee reveals a valgus thrust.  Her range of motion is 0-125 degrees. Moderate patellofemoral crepitus. No joint line tenderness.  She is neurovascularly intact distally.   X-RAYS: X-rays of her right knee reveal 50% joint space narrowing of the medial and lateral compartments with significant spurring laterally. There is bone on bone patellofemoral compartment.   IMPRESSION: End stage degenerative joint disease right knee.  DISPOSITION: At this point, she has extinguished all conservative treatment options and would like to proceed with definitive treatment of a right total knee replacement. We thought about unicompartmental replacement, however after reviewing her X-rays she has significant spurring laterally. We do not feel she is a good candidate for this. We will need to get clearance from her primary care provider before proceeding with surgical intervention.  Ninetta Lights, M.D.

## 2015-06-12 ENCOUNTER — Telehealth: Payer: Self-pay | Admitting: Internal Medicine

## 2015-06-12 NOTE — Telephone Encounter (Signed)
Patient would like to know if referral to Dr. Jiles Prows is still good to use.

## 2015-06-14 NOTE — Telephone Encounter (Signed)
Spoke w/pt to make her aware that her referral is still valid.

## 2015-06-15 ENCOUNTER — Encounter (HOSPITAL_COMMUNITY): Payer: Self-pay

## 2015-06-15 ENCOUNTER — Encounter (HOSPITAL_COMMUNITY)
Admission: RE | Admit: 2015-06-15 | Discharge: 2015-06-15 | Disposition: A | Discharge status: Home / Self Care | Payer: Commercial Managed Care - HMO | Source: Ambulatory Visit | Attending: Orthopedic Surgery | Admitting: Orthopedic Surgery

## 2015-06-15 DIAGNOSIS — R001 Bradycardia, unspecified: Secondary | ICD-10-CM | POA: Diagnosis not present

## 2015-06-15 DIAGNOSIS — Z01812 Encounter for preprocedural laboratory examination: Secondary | ICD-10-CM | POA: Diagnosis not present

## 2015-06-15 DIAGNOSIS — Z0183 Encounter for blood typing: Secondary | ICD-10-CM | POA: Diagnosis not present

## 2015-06-15 DIAGNOSIS — Z01818 Encounter for other preprocedural examination: Secondary | ICD-10-CM | POA: Diagnosis not present

## 2015-06-15 DIAGNOSIS — M1711 Unilateral primary osteoarthritis, right knee: Secondary | ICD-10-CM | POA: Diagnosis not present

## 2015-06-15 HISTORY — DX: Bipolar disorder, unspecified: F31.9

## 2015-06-15 LAB — COMPREHENSIVE METABOLIC PANEL
ALBUMIN: 3.8 g/dL (ref 3.5–5.0)
ALT: 27 U/L (ref 14–54)
AST: 32 U/L (ref 15–41)
Alkaline Phosphatase: 40 U/L (ref 38–126)
Anion gap: 7 (ref 5–15)
BUN: 8 mg/dL (ref 6–20)
CHLORIDE: 102 mmol/L (ref 101–111)
CO2: 27 mmol/L (ref 22–32)
Calcium: 9.1 mg/dL (ref 8.9–10.3)
Creatinine, Ser: 0.78 mg/dL (ref 0.44–1.00)
GFR calc Af Amer: >60 mL/min (ref >60)
GFR calc non Af Amer: >60 mL/min (ref >60)
GLUCOSE: 92 mg/dL (ref 65–99)
POTASSIUM: 4.2 mmol/L (ref 3.5–5.1)
Sodium: 136 mmol/L (ref 135–145)
Total Bilirubin: 0.6 mg/dL (ref 0.3–1.2)
Total Protein: 6.9 g/dL (ref 6.5–8.1)

## 2015-06-15 LAB — CBC WITH DIFFERENTIAL/PLATELET
BASOS ABS: 0.1 10*3/uL (ref 0.0–0.1)
Basophils Relative: 2 %
EOS PCT: 4 %
Eosinophils Absolute: 0.1 10*3/uL (ref 0.0–0.7)
HEMATOCRIT: 41.9 % (ref 36.0–46.0)
Hemoglobin: 14.2 g/dL (ref 12.0–15.0)
LYMPHS ABS: 1.6 10*3/uL (ref 0.7–4.0)
LYMPHS PCT: 39 %
MCH: 31.6 pg (ref 26.0–34.0)
MCHC: 33.9 g/dL (ref 30.0–36.0)
MCV: 93.1 fL (ref 78.0–100.0)
MONO ABS: 0.3 10*3/uL (ref 0.1–1.0)
Monocytes Relative: 8 %
NEUTROS ABS: 1.9 10*3/uL (ref 1.7–7.7)
Neutrophils Relative %: 47 %
PLATELETS: 234 10*3/uL (ref 150–400)
RBC: 4.5 MIL/uL (ref 3.87–5.11)
RDW: 14.1 % (ref 11.5–15.5)
WBC: 4.1 10*3/uL (ref 4.0–10.5)

## 2015-06-15 LAB — SURGICAL PCR SCREEN
MRSA, PCR: NEGATIVE
Staphylococcus aureus: NEGATIVE

## 2015-06-15 LAB — TYPE AND SCREEN
ABO/RH(D): O NEG
ANTIBODY SCREEN: NEGATIVE

## 2015-06-15 LAB — ABO/RH: ABO/RH(D): O NEG

## 2015-06-15 LAB — APTT: APTT: 32 s (ref 24–37)

## 2015-06-15 LAB — PROTIME-INR
INR: 0.96 (ref 0.00–1.49)
Prothrombin Time: 13 seconds (ref 11.6–15.2)

## 2015-06-15 NOTE — Pre-Procedure Instructions (Signed)
    Leah Jensen  06/15/2015      [facility] - [address], [place] [address] Phone: [phone] Fax: [phone]    Your procedure is scheduled on Wednesday, Oct. 26  Report to [facility] Admitting at 11:15 A.M.  Call this number if you have problems the morning of surgery:  [phone]   Remember:  Do not eat food or drink liquids after midnight on Tuesday, Oct 25  Take these medicines the morning of surgery with A SIP OF WATER: clonazepam (klonopin), propranolol (inderal)                      Stop vitamins, herbal medicines, advil, motrin. Stop diclofenac 1 week prior to surgery.   Do not wear jewelry, make-up or nail polish.  Do not wear lotions, powders, or perfumes.  You may wear deodorant.  Do not shave 48 hours prior to surgery.  Men may shave face and neck.  Do not bring valuables to the hospital.  [facility] is not responsible for any belongings or valuables.  Contacts, dentures or bridgework may not be worn into surgery.  Leave your suitcase in the car.  After surgery it may be brought to your room.  For patients admitted to the hospital, discharge time will be determined by your treatment team.  Patients discharged the day of surgery will not be allowed to drive home.   Name and phone number of your driver:    Special instructions:  Preparing for surgery  Please read over the following fact sheets that you were given. Pain Booklet, Coughing and Deep Breathing, Blood Transfusion Information, Total Joint Packet, MRSA Information and Surgical Site Infection Prevention

## 2015-06-15 NOTE — Progress Notes (Signed)
PCP: Dr. Vertell Novak Shar[place]) just married  Pt. States she had echo due to labs showing she might have fluid on the heart. States echo was normal.  Denies any cardiac problems/or shortness of breath.

## 2015-06-17 LAB — URINE CULTURE

## 2015-06-18 ENCOUNTER — Ambulatory Visit
Admission: RE | Admit: 2015-06-18 | Discharge: 2015-06-18 | Disposition: A | Discharge status: Home / Self Care | Payer: Commercial Managed Care - HMO | Source: Ambulatory Visit | Attending: Obstetrics and Gynecology | Admitting: Obstetrics and Gynecology

## 2015-06-18 DIAGNOSIS — Z1231 Encounter for screening mammogram for malignant neoplasm of breast: Secondary | ICD-10-CM

## 2015-06-26 MED ORDER — LACTATED RINGERS IV SOLN
INTRAVENOUS | Status: DC
  Administered 2015-06-27 (×3): via INTRAVENOUS

## 2015-06-26 MED ORDER — TRANEXAMIC ACID 1000 MG/10ML IV SOLN
1000.0000 mg | INTRAVENOUS | Status: AC
  Administered 2015-06-27: 1000 mg via INTRAVENOUS
  Filled 2015-06-26: qty 10

## 2015-06-26 MED ORDER — SODIUM CHLORIDE 0.9 % IV SOLN
1500.0000 mg | INTRAVENOUS | Status: DC
  Filled 2015-06-26: qty 1500

## 2015-06-26 MED ORDER — CHLORHEXIDINE GLUCONATE 4 % EX LIQD: 60.0000 mL | Freq: Once | CUTANEOUS | Status: DC

## 2015-06-27 ENCOUNTER — Inpatient Hospital Stay (HOSPITAL_COMMUNITY): Payer: Commercial Managed Care - HMO

## 2015-06-27 ENCOUNTER — Encounter (HOSPITAL_COMMUNITY): Payer: Self-pay | Admitting: Certified Registered Nurse Anesthetist

## 2015-06-27 ENCOUNTER — Inpatient Hospital Stay (HOSPITAL_COMMUNITY)
Admission: AD | Admit: 2015-06-27 | Discharge: 2015-06-29 | DRG: 470 | Disposition: A | Discharge status: Home Health | Payer: Commercial Managed Care - HMO | Source: Ambulatory Visit | Attending: Orthopedic Surgery | Admitting: Orthopedic Surgery

## 2015-06-27 ENCOUNTER — Inpatient Hospital Stay (HOSPITAL_COMMUNITY): Payer: Commercial Managed Care - HMO | Admitting: Certified Registered Nurse Anesthetist

## 2015-06-27 ENCOUNTER — Encounter (HOSPITAL_COMMUNITY)
Admission: AD | Disposition: A | Discharge status: Home Health | Payer: Self-pay | Source: Ambulatory Visit | Attending: Orthopedic Surgery

## 2015-06-27 DIAGNOSIS — M8580 Other specified disorders of bone density and structure, unspecified site: Secondary | ICD-10-CM | POA: Diagnosis not present

## 2015-06-27 DIAGNOSIS — M1711 Unilateral primary osteoarthritis, right knee: Secondary | ICD-10-CM | POA: Diagnosis not present

## 2015-06-27 DIAGNOSIS — M179 Osteoarthritis of knee, unspecified: Secondary | ICD-10-CM | POA: Diagnosis not present

## 2015-06-27 DIAGNOSIS — M810 Age-related osteoporosis without current pathological fracture: Secondary | ICD-10-CM | POA: Diagnosis present

## 2015-06-27 DIAGNOSIS — F419 Anxiety disorder, unspecified: Secondary | ICD-10-CM | POA: Diagnosis present

## 2015-06-27 DIAGNOSIS — Z96659 Presence of unspecified artificial knee joint: Secondary | ICD-10-CM

## 2015-06-27 DIAGNOSIS — F319 Bipolar disorder, unspecified: Secondary | ICD-10-CM | POA: Diagnosis present

## 2015-06-27 DIAGNOSIS — Z96651 Presence of right artificial knee joint: Secondary | ICD-10-CM | POA: Diagnosis not present

## 2015-06-27 DIAGNOSIS — Z9889 Other specified postprocedural states: Secondary | ICD-10-CM | POA: Diagnosis not present

## 2015-06-27 DIAGNOSIS — M25561 Pain in right knee: Secondary | ICD-10-CM | POA: Diagnosis present

## 2015-06-27 DIAGNOSIS — D62 Acute posthemorrhagic anemia: Secondary | ICD-10-CM | POA: Diagnosis not present

## 2015-06-27 DIAGNOSIS — M545 Low back pain: Secondary | ICD-10-CM | POA: Diagnosis not present

## 2015-06-27 DIAGNOSIS — M1710 Unilateral primary osteoarthritis, unspecified knee: Secondary | ICD-10-CM | POA: Diagnosis present

## 2015-06-27 HISTORY — PX: TOTAL KNEE ARTHROPLASTY: SHX125

## 2015-06-27 HISTORY — DX: Unspecified osteoarthritis, unspecified site: M19.90

## 2015-06-27 SURGERY — ARTHROPLASTY, KNEE, TOTAL
Anesthesia: Spinal | Site: Knee | Laterality: Right

## 2015-06-27 MED ORDER — DIPHENHYDRAMINE HCL 50 MG/ML IJ SOLN
INTRAMUSCULAR | Status: DC | PRN
  Administered 2015-06-27: 25 mg via INTRAVENOUS

## 2015-06-27 MED ORDER — POLYETHYLENE GLYCOL 3350 17 G PO PACK: 17.0000 g | PACK | Freq: Every day | ORAL | Status: DC | PRN

## 2015-06-27 MED ORDER — ACETAMINOPHEN 325 MG PO TABS: 650.0000 mg | ORAL_TABLET | Freq: Four times a day (QID) | ORAL | Status: DC | PRN

## 2015-06-27 MED ORDER — SODIUM CHLORIDE 0.9 % IJ SOLN
INTRAMUSCULAR | Status: AC
  Filled 2015-06-27: qty 10

## 2015-06-27 MED ORDER — PROPOFOL 10 MG/ML IV BOLUS
INTRAVENOUS | Status: AC
  Filled 2015-06-27: qty 20

## 2015-06-27 MED ORDER — MIDAZOLAM HCL 2 MG/2ML IJ SOLN
INTRAMUSCULAR | Status: AC
  Filled 2015-06-27: qty 4

## 2015-06-27 MED ORDER — ALUM & MAG HYDROXIDE-SIMETH 200-200-20 MG/5ML PO SUSP: 30.0000 mL | ORAL | Status: DC | PRN

## 2015-06-27 MED ORDER — EPHEDRINE SULFATE 50 MG/ML IJ SOLN
INTRAMUSCULAR | Status: AC
  Filled 2015-06-27: qty 1

## 2015-06-27 MED ORDER — BUPIVACAINE HCL (PF) 0.5 % IJ SOLN
INTRAMUSCULAR | Status: AC
  Filled 2015-06-27: qty 10

## 2015-06-27 MED ORDER — ONDANSETRON HCL 4 MG PO TABS: 4.0000 mg | ORAL_TABLET | Freq: Three times a day (TID) | ORAL | Status: DC | PRN

## 2015-06-27 MED ORDER — APIXABAN 2.5 MG PO TABS: ORAL_TABLET | ORAL | Status: DC

## 2015-06-27 MED ORDER — PHENYLEPHRINE 40 MCG/ML (10ML) SYRINGE FOR IV PUSH (FOR BLOOD PRESSURE SUPPORT)
PREFILLED_SYRINGE | INTRAVENOUS | Status: AC
  Filled 2015-06-27: qty 10

## 2015-06-27 MED ORDER — BUPIVACAINE LIPOSOME 1.3 % IJ SUSP
20.0000 mL | INTRAMUSCULAR | Status: AC
  Administered 2015-06-27: 20 mL
  Filled 2015-06-27: qty 20

## 2015-06-27 MED ORDER — SODIUM CHLORIDE 0.9 % IR SOLN
Status: DC | PRN
  Administered 2015-06-27 (×2): 1000 mL

## 2015-06-27 MED ORDER — LAMOTRIGINE 25 MG PO TABS
25.0000 mg | ORAL_TABLET | Freq: Every day | ORAL | Status: DC
  Administered 2015-06-27 – 2015-06-28 (×2): 25 mg via ORAL
  Filled 2015-06-27 (×3): qty 1

## 2015-06-27 MED ORDER — ONDANSETRON HCL 4 MG/2ML IJ SOLN: 4.0000 mg | Freq: Once | INTRAMUSCULAR | Status: DC | PRN

## 2015-06-27 MED ORDER — METHOCARBAMOL 1000 MG/10ML IJ SOLN: 500.0000 mg | Freq: Four times a day (QID) | INTRAVENOUS | Status: DC | PRN

## 2015-06-27 MED ORDER — EPHEDRINE SULFATE 50 MG/ML IJ SOLN
INTRAMUSCULAR | Status: DC | PRN
  Administered 2015-06-27 (×5): 10 mg via INTRAVENOUS

## 2015-06-27 MED ORDER — ONDANSETRON HCL 4 MG/2ML IJ SOLN: 4.0000 mg | Freq: Four times a day (QID) | INTRAMUSCULAR | Status: DC | PRN

## 2015-06-27 MED ORDER — HYDROMORPHONE HCL 1 MG/ML IJ SOLN
0.5000 mg | INTRAMUSCULAR | Status: DC | PRN
  Administered 2015-06-28 (×2): 1 mg via INTRAVENOUS
  Filled 2015-06-27 (×2): qty 1

## 2015-06-27 MED ORDER — METOCLOPRAMIDE HCL 5 MG PO TABS: 5.0000 mg | ORAL_TABLET | Freq: Three times a day (TID) | ORAL | Status: DC | PRN

## 2015-06-27 MED ORDER — PHENOL 1.4 % MT LIQD: 1.0000 | OROMUCOSAL | Status: DC | PRN

## 2015-06-27 MED ORDER — ESTRADIOL 0.05 MG/24HR TD PTWK: 0.0500 mg | MEDICATED_PATCH | TRANSDERMAL | Status: DC

## 2015-06-27 MED ORDER — FENTANYL CITRATE (PF) 250 MCG/5ML IJ SOLN
INTRAMUSCULAR | Status: AC
  Filled 2015-06-27: qty 5

## 2015-06-27 MED ORDER — VANCOMYCIN HCL 1000 MG IV SOLR
1000.0000 mg | INTRAVENOUS | Status: DC | PRN
  Administered 2015-06-27: 1500 mg via INTRAVENOUS

## 2015-06-27 MED ORDER — FENTANYL CITRATE (PF) 100 MCG/2ML IJ SOLN
INTRAMUSCULAR | Status: DC | PRN
  Administered 2015-06-27: 50 ug via INTRAVENOUS

## 2015-06-27 MED ORDER — DESVENLAFAXINE SUCCINATE ER 50 MG PO TB24
50.0000 mg | ORAL_TABLET | Freq: Every day | ORAL | Status: DC
  Administered 2015-06-28 (×2): 50 mg via ORAL
  Filled 2015-06-27 (×3): qty 1

## 2015-06-27 MED ORDER — MAGNESIUM CITRATE PO SOLN: 1.0000 | Freq: Once | ORAL | Status: DC | PRN

## 2015-06-27 MED ORDER — MIDAZOLAM HCL 5 MG/5ML IJ SOLN
INTRAMUSCULAR | Status: DC | PRN
  Administered 2015-06-27 (×2): 1 mg via INTRAVENOUS

## 2015-06-27 MED ORDER — METHOCARBAMOL 500 MG PO TABS: 500.0000 mg | ORAL_TABLET | Freq: Four times a day (QID) | ORAL | Status: DC

## 2015-06-27 MED ORDER — VANCOMYCIN HCL IN DEXTROSE 1-5 GM/200ML-% IV SOLN
1000.0000 mg | Freq: Two times a day (BID) | INTRAVENOUS | Status: AC
  Administered 2015-06-28: 1000 mg via INTRAVENOUS
  Filled 2015-06-27: qty 200

## 2015-06-27 MED ORDER — APIXABAN 2.5 MG PO TABS
2.5000 mg | ORAL_TABLET | Freq: Two times a day (BID) | ORAL | Status: DC
  Administered 2015-06-28 – 2015-06-29 (×3): 2.5 mg via ORAL
  Filled 2015-06-27 (×3): qty 1

## 2015-06-27 MED ORDER — SODIUM CHLORIDE 0.9 % IV BOLUS (SEPSIS)
500.0000 mL | Freq: Once | INTRAVENOUS | Status: AC
  Administered 2015-06-27: 500 mL via INTRAVENOUS

## 2015-06-27 MED ORDER — POTASSIUM CHLORIDE IN NACL 20-0.9 MEQ/L-% IV SOLN
INTRAVENOUS | Status: DC
  Administered 2015-06-28: 01:00:00 via INTRAVENOUS
  Filled 2015-06-27: qty 1000

## 2015-06-27 MED ORDER — MEPERIDINE HCL 25 MG/ML IJ SOLN
INTRAMUSCULAR | Status: AC
  Filled 2015-06-27: qty 1

## 2015-06-27 MED ORDER — DIPHENHYDRAMINE HCL 12.5 MG/5ML PO ELIX: 12.5000 mg | ORAL_SOLUTION | ORAL | Status: DC | PRN

## 2015-06-27 MED ORDER — CELECOXIB 200 MG PO CAPS
200.0000 mg | ORAL_CAPSULE | Freq: Two times a day (BID) | ORAL | Status: DC
  Administered 2015-06-27 – 2015-06-29 (×4): 200 mg via ORAL
  Filled 2015-06-27 (×4): qty 1

## 2015-06-27 MED ORDER — BISACODYL 10 MG RE SUPP: 10.0000 mg | Freq: Every day | RECTAL | Status: DC | PRN

## 2015-06-27 MED ORDER — LACTATED RINGERS IV SOLN: INTRAVENOUS | Status: DC

## 2015-06-27 MED ORDER — CLONAZEPAM 0.5 MG PO TABS
0.2500 mg | ORAL_TABLET | Freq: Three times a day (TID) | ORAL | Status: DC | PRN
  Administered 2015-06-28 – 2015-06-29 (×2): 0.5 mg via ORAL
  Filled 2015-06-27 (×2): qty 1

## 2015-06-27 MED ORDER — ZOLPIDEM TARTRATE 5 MG PO TABS: 5.0000 mg | ORAL_TABLET | Freq: Every evening | ORAL | Status: DC | PRN

## 2015-06-27 MED ORDER — ACETAMINOPHEN 650 MG RE SUPP: 650.0000 mg | Freq: Four times a day (QID) | RECTAL | Status: DC | PRN

## 2015-06-27 MED ORDER — PROPOFOL 500 MG/50ML IV EMUL
INTRAVENOUS | Status: DC | PRN
  Administered 2015-06-27: 50 ug/kg/min via INTRAVENOUS

## 2015-06-27 MED ORDER — PHENYLEPHRINE HCL 10 MG/ML IJ SOLN
INTRAMUSCULAR | Status: DC | PRN
  Administered 2015-06-27 (×4): 80 ug via INTRAVENOUS
  Administered 2015-06-27: 40 ug via INTRAVENOUS
  Administered 2015-06-27 (×4): 80 ug via INTRAVENOUS

## 2015-06-27 MED ORDER — METHOCARBAMOL 500 MG PO TABS
500.0000 mg | ORAL_TABLET | Freq: Four times a day (QID) | ORAL | Status: DC | PRN
  Administered 2015-06-28 – 2015-06-29 (×3): 500 mg via ORAL
  Filled 2015-06-27 (×3): qty 1

## 2015-06-27 MED ORDER — DEXAMETHASONE SODIUM PHOSPHATE 10 MG/ML IJ SOLN
10.0000 mg | Freq: Once | INTRAMUSCULAR | Status: AC
  Administered 2015-06-28: 10 mg via INTRAVENOUS
  Filled 2015-06-27: qty 1

## 2015-06-27 MED ORDER — BUPIVACAINE HCL 0.5 % IJ SOLN
INTRAMUSCULAR | Status: DC | PRN
  Administered 2015-06-27: 10 mL

## 2015-06-27 MED ORDER — BUPIVACAINE IN DEXTROSE 0.75-8.25 % IT SOLN
INTRATHECAL | Status: DC | PRN
  Administered 2015-06-27: 2 mL via INTRATHECAL

## 2015-06-27 MED ORDER — HYDROMORPHONE HCL 1 MG/ML IJ SOLN: 0.2500 mg | INTRAMUSCULAR | Status: DC | PRN

## 2015-06-27 MED ORDER — PROPOFOL 10 MG/ML IV BOLUS
INTRAVENOUS | Status: DC | PRN
  Administered 2015-06-27: 50 mg via INTRAVENOUS

## 2015-06-27 MED ORDER — NALBUPHINE HCL 10 MG/ML IJ SOLN
10.0000 mg | Freq: Once | INTRAMUSCULAR | Status: AC
  Administered 2015-06-27: 10 mg via INTRAVENOUS
  Filled 2015-06-27: qty 1

## 2015-06-27 MED ORDER — MENTHOL 3 MG MT LOZG: 1.0000 | LOZENGE | OROMUCOSAL | Status: DC | PRN

## 2015-06-27 MED ORDER — METOCLOPRAMIDE HCL 5 MG/ML IJ SOLN: 5.0000 mg | Freq: Three times a day (TID) | INTRAMUSCULAR | Status: DC | PRN

## 2015-06-27 MED ORDER — OXYCODONE HCL 5 MG PO TABS
5.0000 mg | ORAL_TABLET | ORAL | Status: DC | PRN
  Administered 2015-06-27 – 2015-06-29 (×7): 10 mg via ORAL
  Filled 2015-06-27 (×7): qty 2

## 2015-06-27 MED ORDER — VENLAFAXINE HCL ER 75 MG PO CP24: 75.0000 mg | ORAL_CAPSULE | Freq: Every day | ORAL | Status: DC

## 2015-06-27 MED ORDER — ONDANSETRON HCL 4 MG PO TABS: 4.0000 mg | ORAL_TABLET | Freq: Four times a day (QID) | ORAL | Status: DC | PRN

## 2015-06-27 MED ORDER — OXYCODONE-ACETAMINOPHEN 5-325 MG PO TABS: 1.0000 | ORAL_TABLET | ORAL | Status: DC | PRN

## 2015-06-27 MED ORDER — DIPHENHYDRAMINE HCL 50 MG/ML IJ SOLN
INTRAMUSCULAR | Status: AC
  Filled 2015-06-27: qty 1

## 2015-06-27 MED ORDER — BISACODYL 5 MG PO TBEC: 5.0000 mg | DELAYED_RELEASE_TABLET | Freq: Every day | ORAL | Status: DC | PRN

## 2015-06-27 MED ORDER — DOCUSATE SODIUM 100 MG PO CAPS
100.0000 mg | ORAL_CAPSULE | Freq: Two times a day (BID) | ORAL | Status: DC
  Administered 2015-06-27 – 2015-06-29 (×4): 100 mg via ORAL
  Filled 2015-06-27 (×4): qty 1

## 2015-06-27 MED ORDER — MEPERIDINE HCL 25 MG/ML IJ SOLN
6.2500 mg | INTRAMUSCULAR | Status: DC | PRN
  Administered 2015-06-27 (×2): 12.5 mg via INTRAVENOUS

## 2015-06-27 SURGICAL SUPPLY — 61 items
BANDAGE ELASTIC 4 VELCRO ST LF (GAUZE/BANDAGES/DRESSINGS) ×3 IMPLANT
BANDAGE ELASTIC 6 VELCRO ST LF (GAUZE/BANDAGES/DRESSINGS) ×3 IMPLANT
BANDAGE ESMARK 6X9 LF (GAUZE/BANDAGES/DRESSINGS) ×1 IMPLANT
BENZOIN TINCTURE PRP APPL 2/3 (GAUZE/BANDAGES/DRESSINGS) ×3 IMPLANT
BLADE SAG 18X100X1.27 (BLADE) ×6 IMPLANT
BNDG ESMARK 6X9 LF (GAUZE/BANDAGES/DRESSINGS) ×3
BOWL SMART MIX CTS (DISPOSABLE) ×3 IMPLANT
CAPT KNEE TOTAL 3 ×3 IMPLANT
CEMENT BONE SIMPLEX SPEEDSET (Cement) ×6 IMPLANT
CLOSURE WOUND 1/2 X4 (GAUZE/BANDAGES/DRESSINGS) ×1
COVER SURGICAL LIGHT HANDLE (MISCELLANEOUS) ×3 IMPLANT
CUFF TOURNIQUET SINGLE 34IN LL (TOURNIQUET CUFF) ×3 IMPLANT
DRAPE EXTREMITY T 121X128X90 (DRAPE) ×3 IMPLANT
DRAPE IMP U-DRAPE 54X76 (DRAPES) ×3 IMPLANT
DRAPE PROXIMA HALF (DRAPES) ×3 IMPLANT
DRAPE U-SHAPE 47X51 STRL (DRAPES) ×3 IMPLANT
DRSG PAD ABDOMINAL 8X10 ST (GAUZE/BANDAGES/DRESSINGS) ×3 IMPLANT
DURAPREP 26ML APPLICATOR (WOUND CARE) ×3 IMPLANT
ELECT CAUTERY BLADE 6.4 (BLADE) ×3 IMPLANT
ELECT REM PT RETURN 9FT ADLT (ELECTROSURGICAL) ×3
ELECTRODE REM PT RTRN 9FT ADLT (ELECTROSURGICAL) ×1 IMPLANT
EVACUATOR 1/8 PVC DRAIN (DRAIN) ×3 IMPLANT
FACESHIELD WRAPAROUND (MASK) ×9 IMPLANT
GAUZE SPONGE 4X4 12PLY STRL (GAUZE/BANDAGES/DRESSINGS) ×3 IMPLANT
GLOVE BIOGEL PI IND STRL 7.0 (GLOVE) ×1 IMPLANT
GLOVE BIOGEL PI INDICATOR 7.0 (GLOVE) ×2
GLOVE ORTHO TXT STRL SZ7.5 (GLOVE) ×3 IMPLANT
GLOVE SURG ORTHO 7.0 STRL STRW (GLOVE) ×3 IMPLANT
GOWN STRL REUS W/ TWL LRG LVL3 (GOWN DISPOSABLE) ×2 IMPLANT
GOWN STRL REUS W/ TWL XL LVL3 (GOWN DISPOSABLE) ×2 IMPLANT
GOWN STRL REUS W/TWL LRG LVL3 (GOWN DISPOSABLE) ×4
GOWN STRL REUS W/TWL XL LVL3 (GOWN DISPOSABLE) ×4
HANDPIECE INTERPULSE COAX TIP (DISPOSABLE) ×2
IMMOBILIZER KNEE 22 UNIV (SOFTGOODS) ×3 IMPLANT
IMMOBILIZER KNEE 24 THIGH 36 (MISCELLANEOUS) IMPLANT
IMMOBILIZER KNEE 24 UNIV (MISCELLANEOUS)
KIT BASIN OR (CUSTOM PROCEDURE TRAY) ×3 IMPLANT
KIT ROOM TURNOVER OR (KITS) ×3 IMPLANT
MANIFOLD NEPTUNE II (INSTRUMENTS) ×3 IMPLANT
NEEDLE 18GX1X1/2 (RX/OR ONLY) (NEEDLE) ×3 IMPLANT
NEEDLE HYPO 25GX1X1/2 BEV (NEEDLE) ×3 IMPLANT
NS IRRIG 1000ML POUR BTL (IV SOLUTION) ×3 IMPLANT
PACK TOTAL JOINT (CUSTOM PROCEDURE TRAY) ×3 IMPLANT
PACK UNIVERSAL I (CUSTOM PROCEDURE TRAY) ×3 IMPLANT
PAD ARMBOARD 7.5X6 YLW CONV (MISCELLANEOUS) ×6 IMPLANT
PAD CAST 4YDX4 CTTN HI CHSV (CAST SUPPLIES) ×1 IMPLANT
PADDING CAST COTTON 4X4 STRL (CAST SUPPLIES) ×2
SET HNDPC FAN SPRY TIP SCT (DISPOSABLE) ×1 IMPLANT
STRIP CLOSURE SKIN 1/2X4 (GAUZE/BANDAGES/DRESSINGS) ×2 IMPLANT
SUCTION FRAZIER TIP 10 FR DISP (SUCTIONS) ×3 IMPLANT
SUT MNCRL AB 4-0 PS2 18 (SUTURE) ×3 IMPLANT
SUT VIC AB 0 CT1 27 (SUTURE) ×2
SUT VIC AB 0 CT1 27XBRD ANBCTR (SUTURE) ×1 IMPLANT
SUT VIC AB 1 CTX 36 (SUTURE) ×2
SUT VIC AB 1 CTX36XBRD ANBCTR (SUTURE) ×1 IMPLANT
SUT VIC AB 2-0 CT1 27 (SUTURE) ×4
SUT VIC AB 2-0 CT1 TAPERPNT 27 (SUTURE) ×2 IMPLANT
SYR 50ML LL SCALE MARK (SYRINGE) ×3 IMPLANT
SYR CONTROL 10ML LL (SYRINGE) ×3 IMPLANT
TOWEL OR 17X24 6PK STRL BLUE (TOWEL DISPOSABLE) ×3 IMPLANT
TOWEL OR 17X26 10 PK STRL BLUE (TOWEL DISPOSABLE) ×3 IMPLANT

## 2015-06-27 NOTE — Transfer of Care (Signed)
Immediate Anesthesia Transfer of Care Note  Patient: Leah Jensen  Procedure(s) Performed: Procedure(s): TOTAL KNEE ARTHROPLASTY (Right)  Patient Location: PACU  Anesthesia Type:Spinal  Level of Consciousness: awake, alert , oriented, patient cooperative and responds to stimulation  Airway & Oxygen Therapy: Patient Spontanous Breathing and Patient connected to nasal cannula oxygen  Post-op Assessment: Report given to RN, Post -op Vital signs reviewed and stable, Patient moving all extremities and Patient moving all extremities X 4  Post vital signs: Reviewed and stable  Last Vitals:  Filed Vitals:   06/27/15 1106  BP:   Pulse:   Temp: 36.6 C  Resp:     Complications: No apparent anesthesia complications

## 2015-06-27 NOTE — Anesthesia Procedure Notes (Signed)
Spinal Patient location during procedure: OR Start time: 06/27/2015 12:56 PM End time: 06/27/2015 1:02 PM Staffing Anesthesiologist: Lillia Abed Performed by: anesthesiologist  Preanesthetic Checklist Completed: patient identified, site marked, surgical consent, pre-op evaluation, timeout performed, IV checked, risks and benefits discussed and monitors and equipment checked Spinal Block Patient position: sitting Prep: Betadine Patient monitoring: heart rate, cardiac monitor, continuous pulse ox and blood pressure Approach: right paramedian Location: L3-4 Injection technique: single-shot Needle Needle type: Pencan  Needle gauge: 24 G Needle length: 9 cm Needle insertion depth: 7 cm

## 2015-06-27 NOTE — Anesthesia Preprocedure Evaluation (Signed)
Anesthesia Evaluation  Patient identified by MRN, date of birth, ID band Patient awake    Reviewed: Allergy & Precautions, NPO status , Patient's Chart, lab work & pertinent test results  Airway Mallampati: I  TM Distance: >3 FB Neck ROM: Full    Dental   Pulmonary    Pulmonary exam normal        Cardiovascular Normal cardiovascular exam     Neuro/Psych Anxiety Depression Bipolar Disorder    GI/Hepatic   Endo/Other    Renal/GU      Musculoskeletal   Abdominal   Peds  Hematology   Anesthesia Other Findings   Reproductive/Obstetrics                             Anesthesia Physical Anesthesia Plan  ASA: II  Anesthesia Plan: Spinal   Post-op Pain Management:    Induction: Intravenous  Airway Management Planned: Natural Airway  Additional Equipment:   Intra-op Plan:   Post-operative Plan:   Informed Consent: I have reviewed the patients History and Physical, chart, labs and discussed the procedure including the risks, benefits and alternatives for the proposed anesthesia with the patient or authorized representative who has indicated his/her understanding and acceptance.     Plan Discussed with: CRNA and Surgeon  Anesthesia Plan Comments:         Anesthesia Quick Evaluation

## 2015-06-27 NOTE — Anesthesia Postprocedure Evaluation (Signed)
Anesthesia Post Note  Patient: Leah Jensen  Procedure(s) Performed: Procedure(s) (LRB): TOTAL KNEE ARTHROPLASTY (Right)  Anesthesia type: MAC/SAB  Patient location: PACU  Post pain: Pain level controlled  Post assessment: Patient's Cardiovascular Status Stable, SAB receding  Last Vitals:  Filed Vitals:   06/27/15 1535  BP: 100/54  Pulse: 62  Temp:   Resp: 18    Post vital signs: Reviewed and stable  Level of consciousness: sedated  Complications: No apparent anesthesia complications

## 2015-06-27 NOTE — Progress Notes (Signed)
Called the answering service d/t pt BP 80/66. On call PA gave a telephone order for 500cc bolus.

## 2015-06-27 NOTE — H&P (View-Only) (Signed)
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 She has been seeing Dr.Voytek for this that has proceeded with numerous conservative treatment options to include oral anti-inflammatories, cortisone injections and viscosupplementation injections. All have failed to give her significant relief of her symptoms. She continues to complain of global pain. This has really been ongoing for the past 5-6 years and has progressively worsened.  She has no mechanical symptoms. However she does have quite a bit of instability. She is having some rest pain but nothing at night.  At this point she is ready to proceed with definitive treatment.   Past Medical, Family and Social History are reviewed in detail on patient questionnaire and signed. Review of Systems as detailed in HPI; all others reviewed and are negative.    EXAMINATION: Well-developed, well-nourished white female in no acute distress.  Alert and oriented x 3.  Examination of her right knee reveals a valgus thrust.  Her range of motion is 0-125 degrees. Moderate patellofemoral crepitus. No joint line tenderness.  She is neurovascularly intact distally.   X-RAYS: X-rays of her right knee reveal 50% joint space narrowing of the medial and lateral compartments with significant spurring laterally. There is bone on bone patellofemoral compartment.   IMPRESSION: End stage degenerative joint disease right knee.  DISPOSITION: At this point, she has extinguished all conservative treatment options and would like to proceed with definitive treatment of a right total knee replacement. We thought about unicompartmental replacement, however after reviewing her X-rays she has significant spurring laterally. We do not feel she is a good candidate for this. We will need to get clearance from her primary care provider before proceeding with surgical intervention.  Ninetta Lights, M.D.

## 2015-06-27 NOTE — Progress Notes (Signed)
Orthopedic Tech Progress Note Patient Details:  Leah Jensen 1946-04-02 [id number]  CPM Right Knee CPM Right Knee: On Right Knee Flexion (Degrees): 90 Right Knee Extension (Degrees): 0 Additional Comments: applied ohf   Ortho Devices Ortho Device/Splint Location: footsie roll Ortho Device/Splint Interventions: Ordered   Braulio Bosch 06/27/2015, 3:33 PM

## 2015-06-27 NOTE — Interval H&P Note (Signed)
History and Physical Interval Note:  06/27/2015 8:21 AM  Leah Jensen  has presented today for surgery, with the diagnosis of OA RIGHT KNEE  The various methods of treatment have been discussed with the patient and family. After consideration of risks, benefits and other options for treatment, the patient has consented to  Procedure(s): TOTAL KNEE ARTHROPLASTY (Right) as a surgical intervention .  The patient's history has been reviewed, patient examined, no change in status, stable for surgery.  I have reviewed the patient's chart and labs.  Questions were answered to the patient's satisfaction.     Ninetta Lights

## 2015-06-27 NOTE — Discharge Summary (Addendum)
Patient ID: AMYA HLAD MRN: [id number] DOB/AGE: [birth year] 69 y.o.  Admit date: 06/27/2015 Discharge date: 06/29/2015  Admission Diagnoses:  Active Problems:   DJD (degenerative joint disease) of knee   Discharge Diagnoses:  Same  Past Medical History  Diagnosis Date  . ALLERGIC RHINITIS   . DEPRESSION/ANXIETY   . TREMOR, ESSENTIAL   . Low back pain   . 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 Anxiety   . Bipolar disorder ([place])   . Osteoarthritis     "probably qwhere/Dr. Percell Miller" (06/28/2015)  . Chronic kidney disease     Surgeries: Procedure(s): TOTAL KNEE ARTHROPLASTY on 06/27/2015   Consultants:    Discharged Condition: Improved  Hospital Course: GEMMA RUAN is an 69 y.o. female who was admitted 06/27/2015 for operative treatment of primary localized osteoarthritis right knee. Patient has severe unremitting pain that affects sleep, daily activities, and work/hobbies. After pre-op clearance the patient was taken to the operating room on 06/27/2015 and underwent  Procedure(s): TOTAL KNEE ARTHROPLASTY.  Patient with a  Pre-op Hb of 14.2 developed ABLA on pod #1 with a Hb of 11.8 and 10.8 on pod#2.  Patient is currently stable but we will continue to follow.  Patient was given perioperative antibiotics:      Anti-infectives    Start     Dose/Rate Route Frequency Ordered Stop   06/28/15 0100  vancomycin (VANCOCIN) IVPB 1000 mg/200 mL premix     1,000 mg 200 mL/hr over 60 Minutes Intravenous Every 12 hours 06/27/15 1648 06/28/15 0146   06/27/15 1200  vancomycin (VANCOCIN) 1,500 mg in sodium chloride 0.9 % 500 mL IVPB  Status:  Discontinued     1,500 mg 250 mL/hr over 120 Minutes Intravenous To [facility] 06/26/15 1325 06/27/15 1643       Patient was given sequential compression devices, early ambulation, and chemoprophylaxis to prevent DVT.  Patient benefited maximally  from hospital stay and there were no complications.    Recent vital signs:  Patient Vitals for the past 24 hrs:  BP Temp Temp src Pulse Resp SpO2  06/29/15 0502 (!) 94/50 mmHg 98.4 F (36.9 C) Oral 77 16 99 %  06/28/15 2217 (!) 94/53 mmHg 98.1 F (36.7 C) Oral 80 18 98 %  06/28/15 1418 (!) 103/59 mmHg - - 77 - 98 %  06/28/15 1300 (!) 89/41 mmHg 97.5 F (36.4 C) - 73 16 97 %  06/28/15 1029 (!) 95/51 mmHg - - - - -  06/28/15 0952 96/68 mmHg - - 76 - 98 %     Recent laboratory studies:   Recent Labs  06/28/15 0526 06/29/15 0435  WBC 7.1 7.6  HGB 11.8* 10.8*  HCT 37.0 34.1*  PLT 213 196  NA 130* 137  K 3.7 4.0  CL 99* 106  CO2 26 27  BUN 6 9  CREATININE 0.75 0.66  GLUCOSE 112* 115*  CALCIUM 7.5* 8.3*     Discharge Medications:     Medication List    STOP taking these medications        diclofenac 75 MG EC tablet  Commonly known as:  VOLTAREN      TAKE these medications        ACIDOPHILUS/BIFIDUS PO  Take 1 capsule by mouth 2 (two) times daily.     apixaban 2.5 MG Tabs tablet  Commonly known as:  ELIQUIS  Take 1 tab po q12 hours  x 14 days following surgery to prevent blood clots     b complex vitamins tablet  Take 1 tablet by mouth every morning.     Biotin 5000 MCG Tabs  Take 1 tablet by mouth 2 (two) times daily.     bisacodyl 5 MG EC tablet  Commonly known as:  DULCOLAX  Take 1 tablet (5 mg total) by mouth daily as needed for moderate constipation.     CALCIUM CITRATE + D PO  Take 1 tablet by mouth 2 (two) times daily.     clonazePAM 0.5 MG tablet  Commonly known as:  KLONOPIN  Take 0.25-0.5 mg by mouth 3 (three) times daily as needed for anxiety. Take 1/2 tab twice daily if needed and 1 tab at bedtime as needed     denosumab 60 MG/ML Soln injection  Commonly known as:  PROLIA  Inject 60 mg into the skin every 6 (six) months. Administer in upper arm, thigh, or abdomen     desvenlafaxine 50 MG 24 hr tablet  Commonly known as:  PRISTIQ  Take  50 mg by mouth at bedtime.     lamoTRIgine 25 MG tablet  Commonly known as:  LAMICTAL  Take 25 mg by mouth at bedtime.     methocarbamol 500 MG tablet  Commonly known as:  ROBAXIN  Take 1 tablet (500 mg total) by mouth 4 (four) times daily.     multivitamin with minerals Tabs tablet  Take 1 tablet by mouth every morning.     ondansetron 4 MG tablet  Commonly known as:  ZOFRAN  Take 1 tablet (4 mg total) by mouth every 8 (eight) hours as needed for nausea or vomiting.     oxyCODONE-acetaminophen 5-325 MG tablet  Commonly known as:  ROXICET  Take 1-2 tablets by mouth every 4 (four) hours as needed.     PROGESTERONE PO  Take 100 mg elemental calcium/kg/hr by mouth every other day. Take 50 mg by mouth every other day     propranolol 20 MG tablet  Commonly known as:  INDERAL  Take 10 mg by mouth 3 (three) times daily as needed (for anxiety).     Vitamin D3 1000 UNITS Caps  Take 1,000 Units by mouth 2 (two) times daily.     VIVELLE-DOT 0.025 MG/24HR  Generic drug:  estradiol  Place 1 patch onto the skin 2 (two) times a week.        Diagnostic Studies: Dg Knee Right Port  06/27/2015  CLINICAL DATA:  Status post right knee replacement EXAM: PORTABLE RIGHT KNEE - 1-2 VIEW COMPARISON:  None. 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 Air is noted in the surgical bed. No acute bony abnormality is seen. IMPRESSION: Status post right knee replacement Electronically Signed   By: Inez Catalina M.D.   On: 06/27/2015 15:31   Mm Screening Breast Tomo Bilateral  06/19/2015  CLINICAL DATA:  Screening. EXAM: DIGITAL SCREENING BILATERAL MAMMOGRAM WITH 3D TOMO WITH CAD COMPARISON:  Previous exam(s). ACR Breast Density Category b: There are scattered areas of fibroglandular density. FINDINGS: There are no findings suspicious for malignancy. Images were processed with CAD. IMPRESSION: No mammographic evidence of malignancy. A result letter of this screening mammogram will be mailed directly to  the patient. RECOMMENDATION: Screening mammogram in one year. (Code:SM-B-01Y) BI-RADS CATEGORY  1: Negative. Electronically Signed   By: Margarette Canada M.D.   On: 06/19/2015 11:26    Disposition: 01-Home or Self Care    Follow-up  Information    Follow up with Ninetta Lights, MD. Schedule an appointment as soon as possible for a visit in 2 weeks.   Specialty:  Orthopedic Surgery   Contact information:   [place] [address] [phone]       Follow up with [facility].   Why:  They will contact you to schedule home therapy visits.   Contact information:   [address] [place] [address] [phone]        Signed: Fannie Knee 06/29/2015, 7:24 AM

## 2015-06-28 ENCOUNTER — Encounter (HOSPITAL_COMMUNITY): Payer: Self-pay | Admitting: Orthopedic Surgery

## 2015-06-28 LAB — BASIC METABOLIC PANEL
ANION GAP: 5 (ref 5–15)
BUN: 6 mg/dL (ref 6–20)
CO2: 26 mmol/L (ref 22–32)
Calcium: 7.5 mg/dL — ABNORMAL LOW (ref 8.9–10.3)
Chloride: 99 mmol/L — ABNORMAL LOW (ref 101–111)
Creatinine, Ser: 0.75 mg/dL (ref 0.44–1.00)
Glucose, Bld: 112 mg/dL — ABNORMAL HIGH (ref 65–99)
POTASSIUM: 3.7 mmol/L (ref 3.5–5.1)
SODIUM: 130 mmol/L — AB (ref 135–145)

## 2015-06-28 LAB — CBC
HEMATOCRIT: 37 % (ref 36.0–46.0)
HEMOGLOBIN: 11.8 g/dL — AB (ref 12.0–15.0)
MCH: 30.3 pg (ref 26.0–34.0)
MCHC: 31.9 g/dL (ref 30.0–36.0)
MCV: 94.9 fL (ref 78.0–100.0)
Platelets: 213 10*3/uL (ref 150–400)
RBC: 3.9 MIL/uL (ref 3.87–5.11)
RDW: 14.5 % (ref 11.5–15.5)
WBC: 7.1 10*3/uL (ref 4.0–10.5)

## 2015-06-28 MED ORDER — LAMOTRIGINE 25 MG PO TABS: 25.0000 mg | ORAL_TABLET | Freq: Every day | ORAL | Status: DC

## 2015-06-28 MED ORDER — KCL-LACTATED RINGERS 20 MEQ/L IV SOLN
INTRAVENOUS | Status: DC
  Administered 2015-06-28: 11:00:00 via INTRAVENOUS
  Filled 2015-06-28 (×3): qty 1000

## 2015-06-28 NOTE — Care Management Note (Signed)
Case Management Note  Patient Details  Name: JESSENIA FILIPPONE MRN: [id number] Date of Birth: [birth year]  Subjective/Objective:            S/p right total knee arthroplasty         Action/Plan: Set up with [facility] for HHPT by MD office. Spoke with patient, no change in discharge plan. Patient will be staying with her daughter initially. Contacted Miranda at [facility] and gave her daughter's address. [employer] to deliver CPM to patient's home and 3N1 to patient's room, already has rolling walker.   i Expected Discharge Date:                  Expected Discharge Plan:  [place]  In-House Referral:  NA  Discharge planning Services  CM Consult  Post Acute Care Choice:  Durable Medical Equipment, Home Health Choice offered to:  Patient  DME Arranged:  3-N-1, CPM DME Agency:  [employer]  HH Arranged:  PT [place] Agency:  [facility]  Status of Service:  Completed, signed off  Medicare Important Message Given:    Date Medicare IM Given:    Medicare IM give by:    Date Additional Medicare IM Given:    Additional Medicare Important Message give by:     If discussed at [place] of Stay Meetings, dates discussed:    Additional Comments:  Nila Nephew, RN 06/28/2015, 3:23 PM

## 2015-06-28 NOTE — Plan of Care (Signed)
Problem: Consults Goal: Diagnosis- Total Joint Replacement Outcome: Completed/Met Date Met:  06/28/15 Primary Total Knee right

## 2015-06-28 NOTE — Progress Notes (Signed)
Subjective: 1 Day Post-Op Procedure(s) (LRB): TOTAL KNEE ARTHROPLASTY (Right) Patient reports pain as moderate.  No nausea/vomiting, chest pain/sob, lightheadedness/dizziness.  Negative flatus/bm.  Tolerating diet.  Objective: Vital signs in last 24 hours: Temp:  [97.3 F (36.3 C)-98.7 F (37.1 C)] 98.7 F (37.1 C) (10/27 0634) Pulse Rate:  [52-78] 71 (10/27 0634) Resp:  [7-22] 18 (10/27 0634) BP: (80-125)/(45-96) 93/53 mmHg (10/27 0634) SpO2:  [95 %-100 %] 100 % (10/27 0634) Weight:  [69.854 kg (154 lb)] 69.854 kg (154 lb) (10/26 1105)  Intake/Output from previous day: 10/26 0701 - 10/27 0700 In: 3105 [I.V.:2905; IV Piggyback:200] Out: 1350 [Urine:1350] Intake/Output this shift: Total I/O In: 655 [I.V.:455; IV Piggyback:200] Out: 850 [Urine:850]  No results for input(s): HGB in the last 72 hours. No results for input(s): WBC, RBC, HCT, PLT in the last 72 hours.  Recent Labs  06/28/15 0526  NA 130*  K 3.7  CL 99*  CO2 26  BUN 6  CREATININE 0.75  GLUCOSE 112*  CALCIUM 7.5*   No results for input(s): LABPT, INR in the last 72 hours.  Neurologically intact Neurovascular intact Sensation intact distally Intact pulses distally Dorsiflexion/Plantar flexion intact Compartment soft  Assessment/Plan: 1 Day Post-Op Procedure(s) (LRB): TOTAL KNEE ARTHROPLASTY (Right) Advance diet Up with therapy Discharge home with home health tomorrow WBAT RLE Dry dressing change prn  Fannie Knee 06/28/2015, 6:57 AM

## 2015-06-28 NOTE — Op Note (Signed)
NAMEMYNDI, WAMBLE NO.:  [id number]  MEDICAL RECORD NO.:  [id number]  LOCATION:  [id number]                        FACILITY:  [facility]  PHYSICIAN:  Ninetta Lights, M.D. DATE OF BIRTH:  Dec 09, 1945  DATE OF PROCEDURE:  06/27/2015 DATE OF DISCHARGE:                              OPERATIVE REPORT   PREOPERATIVE DIAGNOSIS:  Right knee end-stage arthritis, primary localized.  Most marked changes, patellofemoral joint with significant patella erosion.  POSTOPERATIVE DIAGNOSIS:  Right knee end-stage arthritis, primary localized.  Most marked changes, patellofemoral joint with significant patella erosion, with the more than moderate regional osteopenia.  PROCEDURE:  Right knee modified minimally-invasive total knee replacement Stryker triathlon prosthesis.  Cemented pegged #3 cruciate retaining femoral component.  Cemented #3 tibial component, 9 mm polyethylene insert.  Cemented resurfacing 32-mm patellar component.  SURGEON:  Ninetta Lights, M.D.  ASSISTANT:  Elmyra Ricks, P.A., present throughout the entire case and necessary for timely completion of procedure.  ANESTHESIA:  Spinal.  BLOOD LOSS:  Minimal  SPECIMENS:  None.  CULTURES:  None.  COMPLICATIONS:  None.  DRESSINGS:  Soft compressive.  TOURNIQUET TIME:  45 minutes.  DESCRIPTION OF PROCEDURE:  The patient was brought to the operating room, placed on the operating table in supine position.  After adequate anesthesia had been obtained, tourniquet applied.  Prepped and draped in usual sterile fashion.  Exsanguinated with elevation of Esmarch. Tourniquet inflated to 350 mmHg.  Longitudinal incision above the patella down to tibial tubercle.  Skin and subcutaneous tissue divided. Medial arthrotomy, vastus splitting.  Extensive erosive changes obliterating the lateral trochlea and making the lateral patella actually concave rather than convex.  Grade IV changes obviously there. Also focal grade  IV medial compartment.  Intramedullary guide, distal femur.  An 8 mm resection 5 degrees of valgus.  Using epicondylar axis, the femur was sized, cut, and fitted for a pegged CR #3 component.  Very osteopenic throughout.  Proximal tibial resection with extramedullary guide.  Also size #3 component.  Patella exposed.  I brought this to a flat surface.  The lateral side so thin.  I could barely get sufficient bone to implant a component.  This was reasonable after completion of cuts.  It was then drill sized and fitted for a 32-mm component.  Trials put in place, including 9 mm insert.  Pleased with balancing stability, motion, flexion and extension by mechanical axis, and patellar tracking. Tibia was marked for rotation and reamed.  All trials removed.  Copious irrigation with pulse irrigating device.  Cement prepared, placed on all components, firmly seated.  Polyethylene attached to tibia, knee reduced.  Patella held with a clamp.  Once cement hardened, the knee was examined.  Again, thoroughly irrigated.  Soft tissues injected with Exparel.  Arthrotomy closed with Vicryl.  The subcu subcuticular closure margins were injected with Marcaine.  Sterile compressive dressing applied.  Tourniquet deflated removed.  Knee immobilizer applied. Anesthesia reversed.  Brought to the recovery room.  Tolerated the surgery well.  No complications.     Ninetta Lights, M.D.     DFM/MEDQ  D:  06/28/2015  T:  06/28/2015  Job:  [id number]

## 2015-06-28 NOTE — Progress Notes (Signed)
Physical Therapy Treatment Patient Details Name: Leah Jensen MRN: [id number] DOB: [birth year] Today's Date: 06/28/2015    History of Present Illness 69 y.o. female admitted to [facility] on 06/26/15 for elective R TKA.  Pt with significant PMHx of depression/anxiety, low back pain, and bipolar d/o.    PT Comments    Pt able to gait train further distance during afternoon PT treatment with min guard to increase safety, chair to follow. Pt did not report and nausea during treatment.  Pt able to perform HEP without an increase in pain.   Follow Up Recommendations  Home health PT;Supervision for mobility/OOB     Equipment Recommendations  3in1 (PT)       Precautions / Restrictions Precautions Precautions: Knee Restrictions Weight Bearing Restrictions: Yes RLE Weight Bearing: Weight bearing as tolerated    Mobility  Bed Mobility Overal bed mobility: Needs Assistance Bed Mobility: Supine to Sit     Supine to sit: Min assist     General bed mobility comments: Min assist needed to support RLE to get EOB.  Transfers Overall transfer level: Needs assistance Equipment used: Rolling walker (2 wheeled) Transfers: Sit to/from Stand Sit to Stand: Min guard         General transfer comment: Pt required min guard during sit to stand for safety.  Ambulation/Gait Ambulation/Gait assistance: Min guard Ambulation Distance (Feet): 55 Feet Assistive device: Rolling walker (2 wheeled) Gait Pattern/deviations: Step-to pattern;Decreased stance time - right;Trunk flexed;Antalgic Gait velocity: decreased Gait velocity interpretation: <1.8 ft/sec, indicative of risk for recurrent falls General Gait Details: Pt required verbal cues for upright posture during gait training. Pt required increased time during ambulation due to pain.                        Balance Overall balance assessment: Needs assistance Sitting-balance support: Feet supported;No upper extremity  supported Sitting balance-Leahy Scale: Good     Standing balance support: Bilateral upper extremity supported;Single extremity supported Standing balance-Leahy Scale: Poor                      Cognition Arousal/Alertness: Awake/alert Behavior During Therapy: WFL for tasks assessed/performed Overall Cognitive Status: Within Functional Limits for tasks assessed                      Exercises Total Joint Exercises Ankle Circles/Pumps: AROM;Both;20 reps;Supine Quad Sets: AROM;Right;10 reps;Seated Towel Squeeze: AROM;10 reps;Right;Seated (Pt c/o slight pain) Heel Slides: AROM;Right;10 reps;Seated        Pertinent Vitals/Pain Pain Assessment: 0-10 Pain Score: 8  Pain Location: right knee Pain Descriptors / Indicators: Pins and needles Pain Intervention(s): Monitored during session    Home Living Family/patient expects to be discharged to:: Private residence (daughter's house) Living Arrangements: Alone Available Help at Discharge: Family;Available PRN/intermittently (most of the time) Type of Home: House Home Access: Stairs to enter Entrance Stairs-Rails: Right;Left Home Layout: One level Home Equipment: Environmental consultant - 2 wheels      Prior Function Level of Independence: Independent      Comments: pt works delivering flowers   PT Goals (current goals can now be found in the care plan section) Acute Rehab PT Goals Patient Stated Goal: to decrease pain and nausea PT Goal Formulation: With patient Time For Goal Achievement: 07/12/15 Potential to Achieve Goals: Good    Frequency  7X/week                 Time: 6962-9528 PT  Time Calculation (min) (ACUTE ONLY): 49 min  Charges:    1 Gait 1 TE 1 TA                  Sundra [place], [place] [phone] OFFICE  06/28/2015, 4:30 PM

## 2015-06-28 NOTE — Evaluation (Signed)
Physical Therapy Evaluation Patient Details Name: Leah Jensen MRN: [id number] DOB: 10-20-1945 Today's Date: 06/28/2015   History of Present Illness  69 y.o. female admitted to [facility] on 06/26/15 for elective R TKA.  Pt with significant PMHx of depression/anxiety, low back pain, and bipolar d/o.  Clinical Impression  Pt is POD # 1 and is moving well.  Despite N/V she was able to participate and mobilize into the hallway.  I anticipate pt will be able to progress well enough to d/c home with HHPT at discharge.   PT to follow acutely for deficits listed below.       Follow Up Recommendations Home health PT;Supervision for mobility/OOB    Equipment Recommendations  3in1 (PT)    Recommendations for Other Services   NA    Precautions / Restrictions Precautions Precautions: Knee Restrictions RLE Weight Bearing: Weight bearing as tolerated      Mobility  Bed Mobility Overal bed mobility: Needs Assistance Bed Mobility: Supine to Sit     Supine to sit: Min assist     General bed mobility comments: Min assist to support right leg to get to EOB.  Verbal cues for 1/2 bridge technique.    Transfers Overall transfer level: Needs assistance Equipment used: Rolling walker (2 wheeled)   Sit to Stand: Min assist         General transfer comment: Min assist to support trunk during transitions and verbal cues for correct hand placement  Ambulation/Gait Ambulation/Gait assistance: Min assist Ambulation Distance (Feet): 25 Feet Assistive device: Rolling walker (2 wheeled) Gait Pattern/deviations: Step-to pattern Gait velocity: decreased Gait velocity interpretation: <1.8 ft/sec, indicative of risk for recurrent falls General Gait Details: Pt with moderately antalgic gait pattern.  Verbal cues for safe RW use and upright posture.  Chair to follow for safety.  Gait distance limited by N/V.  RN made aware.          Balance Overall balance assessment: Needs  assistance Sitting-balance support: Feet supported;No upper extremity supported Sitting balance-Leahy Scale: Good     Standing balance support: Bilateral upper extremity supported;Single extremity supported Standing balance-Leahy Scale: Poor                               Pertinent Vitals/Pain Pain Assessment: 0-10 Pain Score: 8  Pain Location: right knee Pain Descriptors / Indicators: Aching Pain Intervention(s): Limited activity within patient's tolerance;Monitored during session;Repositioned    Home Living Family/patient expects to be discharged to:: Private residence (daughter's house) Living Arrangements: Alone Available Help at Discharge: Family;Available PRN/intermittently (most of the time) Type of Home: House Home Access: Stairs to enter Entrance Stairs-Rails: Psychiatric nurse of Steps: 2 Home Layout: One level Home Equipment: Walker - 2 wheels      Prior Function Level of Independence: Independent         Comments: pt works Herbalist [employer]   Dominant Hand: Right    Extremity/Trunk Assessment   Upper Extremity Assessment: Overall WFL for tasks assessed           Lower Extremity Assessment: RLE deficits/detail RLE Deficits / Details: right leg with normal post op pain and weakness.  Ankle at least 3/5, knee, 2/5, hip 2/5 per gross functional assessment.      Cervical / Trunk Assessment: Other exceptions  Communication   Communication: No difficulties  Cognition Arousal/Alertness: Awake/alert Behavior During Therapy: WFL for tasks assessed/performed Overall Cognitive Status:  Within Functional Limits for tasks assessed                         Exercises Total Joint Exercises Ankle Circles/Pumps: AROM;Both;20 reps;Supine      Assessment/Plan    PT Assessment Patient needs continued PT services  PT Diagnosis Difficulty walking;Abnormality of gait;Acute pain;Generalized weakness   PT  Problem List Decreased strength;Decreased activity tolerance;Decreased range of motion;Decreased balance;Decreased mobility;Decreased knowledge of use of DME;Decreased knowledge of precautions;Pain  PT Treatment Interventions DME instruction;Stair training;Gait training;Functional mobility training;Therapeutic activities;Therapeutic exercise;Balance training;Neuromuscular re-education;Patient/family education;Manual techniques;Modalities   PT Goals (Current goals can be found in the Care Plan section) Acute Rehab PT Goals Patient Stated Goal: to decrease pain and nausea PT Goal Formulation: With patient Time For Goal Achievement: 07/12/15 Potential to Achieve Goals: Good    Frequency 7X/week             Time: 1000-1035 PT Time Calculation (min) (ACUTE ONLY): 35 min   Charges:    1 EV 1 gait          Laporsha Grealish B. Seng Larch, PT, DPT [phone]   06/28/2015, 2:27 PM

## 2015-06-28 NOTE — Discharge Instructions (Signed)
INSTRUCTIONS AFTER JOINT REPLACEMENT   o Remove items at home which could result in a fall. This includes throw rugs or furniture in walking pathways o ICE to the affected joint every three hours while awake for 30 minutes at a time, for at least the first 3-5 days, and then as needed for pain and swelling.  Continue to use ice for pain and swelling. You may notice swelling that will progress down to the foot and ankle.  This is normal after surgery.  Elevate your leg when you are not up walking on it.   o Continue to use the breathing machine you got in the hospital (incentive spirometer) which will help keep your temperature down.  It is common for your temperature to cycle up and down following surgery, especially at night when you are not up moving around and exerting yourself.  The breathing machine keeps your lungs expanded and your temperature down.  BLOOD THINNER: TAKE ELIQUIS AS DIRECTED FOR A TOTAL OF 14 DAYS FOLLOWING SURGERY TO PREVENT BLOOD CLOTS.  ONCE FINISHED WITH THIS, TAKE ASPIRIN 325 MG ONE TAB ONCE DAILY FOR THE NEXT 14 DAYS.  THESE ARE BOTH USED TO PREVENT BLOOD CLOTS.  DIET:  As you were doing prior to hospitalization, we recommend a well-balanced diet.  DRESSING / WOUND CARE / SHOWERING  You may change your dressing 3-5 days after surgery.  Then change the dressing every day with sterile gauze.  Please use good hand washing techniques before changing the dressing.  Do not use any lotions or creams on the incision until instructed by your surgeon. and You may shower 3 days after surgery, but keep the wounds dry during showering.  You may use an occlusive plastic wrap (Press'n Seal for example), NO SOAKING/SUBMERGING IN THE BATHTUB.  If the bandage gets wet, change with a clean dry gauze.  If the incision gets wet, pat the wound dry with a clean towel.  ACTIVITY  o Increase activity slowly as tolerated, but follow the weight bearing instructions below.   o No driving for 6 weeks  or until further direction given by your physician.  You cannot drive while taking narcotics.  o No lifting or carrying greater than 10 lbs. until further directed by your surgeon. o Avoid periods of inactivity such as sitting longer than an hour when not asleep. This helps prevent blood clots.  o You may return to work once you are authorized by your doctor.     WEIGHT BEARING   Weight bearing as tolerated with assist device (walker, cane, etc) as directed, use it as long as suggested by your surgeon or therapist, typically at least 4-6 weeks.   EXERCISES  Results after joint replacement surgery are often greatly improved when you follow the exercise, range of motion and muscle strengthening exercises prescribed by your doctor. Safety measures are also important to protect the joint from further injury. Any time any of these exercises cause you to have increased pain or swelling, decrease what you are doing until you are comfortable again and then slowly increase them. If you have problems or questions, call your caregiver or physical therapist for advice.   Rehabilitation is important following a joint replacement. After just a few days of immobilization, the muscles of the leg can become weakened and shrink (atrophy).  These exercises are designed to build up the tone and strength of the thigh and leg muscles and to improve motion. Often times heat used for twenty to thirty  minutes before working out will loosen up your tissues and help with improving the range of motion but do not use heat for the first two weeks following surgery (sometimes heat can increase post-operative swelling).   These exercises can be done on a training (exercise) mat, on the floor, on a table or on a bed. Use whatever works the best and is most comfortable for you.    Use music or television while you are exercising so that the exercises are a pleasant break in your day. This will make your life better with the  exercises acting as a break in your routine that you can look forward to.   Perform all exercises about fifteen times, three times per day or as directed.  You should exercise both the operative leg and the other leg as well.  Exercises include:    Quad Sets - Tighten up the muscle on the front of the thigh (Quad) and hold for 5-10 seconds.    Straight Leg Raises - With your knee straight (if you were given a brace, keep it on), lift the leg to 60 degrees, hold for 3 seconds, and slowly lower the leg.  Perform this exercise against resistance later as your leg gets stronger.   Leg Slides: Lying on your back, slowly slide your foot toward your buttocks, bending your knee up off the floor (only go as far as is comfortable). Then slowly slide your foot back down until your leg is flat on the floor again.   Angel Wings: Lying on your back spread your legs to the side as far apart as you can without causing discomfort.   Hamstring Strength:  Lying on your back, push your heel against the floor with your leg straight by tightening up the muscles of your buttocks.  Repeat, but this time bend your knee to a comfortable angle, and push your heel against the floor.  You may put a pillow under the heel to make it more comfortable if necessary.   A rehabilitation program following joint replacement surgery can speed recovery and prevent re-injury in the future due to weakened muscles. Contact your doctor or a physical therapist for more information on knee rehabilitation.    CONSTIPATION  Constipation is defined medically as fewer than three stools per week and severe constipation as less than one stool per week.  Even if you have a regular bowel pattern at home, your normal regimen is likely to be disrupted due to multiple reasons following surgery.  Combination of anesthesia, postoperative narcotics, change in appetite and fluid intake all can affect your bowels.   YOU MUST use at least one of the  following options; they are listed in order of increasing strength to get the job done.  They are all available over the counter, and you may need to use some, POSSIBLY even all of these options:    Drink plenty of fluids (prune juice may be helpful) and high fiber foods Colace 100 mg by mouth twice a day  Senokot for constipation as directed and as needed Dulcolax (bisacodyl), take with full glass of water  Miralax (polyethylene glycol) once or twice a day as needed.  If you have tried all these things and are unable to have a bowel movement in the first 3-4 days after surgery call either your surgeon or your primary doctor.    If you experience loose stools or diarrhea, hold the medications until you stool forms back up.  If your symptoms  do not get better within 1 week or if they get worse, check with your doctor.  If you experience "the worst abdominal pain ever" or develop nausea or vomiting, please contact the office immediately for further recommendations for treatment.   ITCHING:  If you experience itching with your medications, try taking only a single pain pill, or even half a pain pill at a time.  You can also use Benadryl over the counter for itching or also to help with sleep.   TED HOSE STOCKINGS:  Use stockings on both legs until for at least 2 weeks or as directed by physician office. They may be removed at night for sleeping.  MEDICATIONS:  See your medication summary on the After Visit Summary that nursing will review with you.  You may have some home medications which will be placed on hold until you complete the course of blood thinner medication.  It is important for you to complete the blood thinner medication as prescribed.  PRECAUTIONS:  If you experience chest pain or shortness of breath - call 911 immediately for transfer to the hospital emergency department.   If you develop a fever greater that 101 F, purulent drainage from wound, increased redness or drainage from  wound, foul odor from the wound/dressing, or calf pain - CONTACT YOUR SURGEON.                                                   FOLLOW-UP APPOINTMENTS:  If you do not already have a post-op appointment, please call the office for an appointment to be seen by your surgeon.  Guidelines for how soon to be seen are listed in your After Visit Summary, but are typically between 1-4 weeks after surgery.  OTHER INSTRUCTIONS:   Knee Replacement:  Do not place pillow under knee, focus on keeping the knee straight while resting. CPM instructions: 0-90 degrees, 2 hours in the morning, 2 hours in the afternoon, and 2 hours in the evening. Place foam block, curve side up under heel at all times except when in CPM or when walking.  DO NOT modify, tear, cut, or change the foam block in any way.  MAKE SURE YOU:   Understand these instructions.   Get help right away if you are not doing well or get worse.    Thank you for letting us be a part of your medical care team.  It is a privilege we respect greatly.  We hope these instructions will help you stay on track for a fast and full recovery!   Information on my medicine - ELIQUIS (apixaban)  This medication education was reviewed with me or my healthcare representative as part of my discharge preparation.  The pharmacist that spoke with me during my hospital stay was:  Tad Moore, [facility]  Why was Eliquis prescribed for you? Eliquis was prescribed for you to reduce the risk of blood clots forming after orthopedic surgery.    What do You need to know about Eliquis? Take your Eliquis TWICE DAILY - one tablet in the morning and one tablet in the evening with or without food.  It would be best to take the dose about the same time each day.  If you have difficulty swallowing the tablet whole please discuss with your pharmacist how to take the medication safely.  Take Eliquis  exactly as prescribed by your doctor and DO NOT stop taking Eliquis  without talking to the doctor who prescribed the medication.  Stopping without other medication to take the place of Eliquis may increase your risk of developing a clot.  After discharge, you should have regular check-up appointments with your healthcare provider that is prescribing your Eliquis.  What do you do if you miss a dose? If a dose of ELIQUIS is not taken at the scheduled time, take it as soon as possible on the same day and twice-daily administration should be resumed.  The dose should not be doubled to make up for a missed dose.  Do not take more than one tablet of ELIQUIS at the same time.  Important Safety Information A possible side effect of Eliquis is bleeding. You should call your healthcare provider right away if you experience any of the following: ? Bleeding from an injury or your nose that does not stop. ? Unusual colored urine (red or dark brown) or unusual colored stools (red or black). ? Unusual bruising for unknown reasons. ? A serious fall or if you hit your head (even if there is no bleeding).  Some medicines may interact with Eliquis and might increase your risk of bleeding or clotting while on Eliquis. To help avoid this, consult your healthcare provider or pharmacist prior to using any new prescription or non-prescription medications, including herbals, vitamins, non-steroidal anti-inflammatory drugs (NSAIDs) and supplements.  This website has more information on Eliquis (apixaban): http://www.eliquis.com/eliquis/home

## 2015-06-29 DIAGNOSIS — Z96651 Presence of right artificial knee joint: Secondary | ICD-10-CM | POA: Diagnosis not present

## 2015-06-29 LAB — CBC
HCT: 34.1 % — ABNORMAL LOW (ref 36.0–46.0)
HEMOGLOBIN: 10.8 g/dL — AB (ref 12.0–15.0)
MCH: 29.3 pg (ref 26.0–34.0)
MCHC: 31.7 g/dL (ref 30.0–36.0)
MCV: 92.7 fL (ref 78.0–100.0)
Platelets: 196 10*3/uL (ref 150–400)
RBC: 3.68 MIL/uL — ABNORMAL LOW (ref 3.87–5.11)
RDW: 14.4 % (ref 11.5–15.5)
WBC: 7.6 10*3/uL (ref 4.0–10.5)

## 2015-06-29 LAB — BASIC METABOLIC PANEL
Anion gap: 4 — ABNORMAL LOW (ref 5–15)
BUN: 9 mg/dL (ref 6–20)
CHLORIDE: 106 mmol/L (ref 101–111)
CO2: 27 mmol/L (ref 22–32)
Calcium: 8.3 mg/dL — ABNORMAL LOW (ref 8.9–10.3)
Creatinine, Ser: 0.66 mg/dL (ref 0.44–1.00)
GFR calc Af Amer: >60 mL/min (ref >60)
GFR calc non Af Amer: >60 mL/min (ref >60)
GLUCOSE: 115 mg/dL — AB (ref 65–99)
Potassium: 4 mmol/L (ref 3.5–5.1)
Sodium: 137 mmol/L (ref 135–145)

## 2015-06-29 NOTE — Progress Notes (Signed)
Occupational Therapy Evaluation Patient Details Name: Leah Jensen MRN: [id number] DOB: 1946/08/07 Today's Date: 06/29/2015    History of Present Illness 69 y.o. female admitted to [facility] on 06/26/15 for elective R TKA.  Pt with significant PMHx of depression/anxiety, low back pain, and bipolar d/o.   Clinical Impression   Patient presents to OT with decreased ADL independence as expected post-op. See below for details. All OT education completed and patient plans to d/c home to daughter's home today where she will have PRN assistance with ADLs. Recommend 3 in 1. OT will sign off.    Follow Up Recommendations  No OT follow up;Supervision - Intermittent    Equipment Recommendations  3 in 1 bedside comode    Recommendations for Other Services       Precautions / Restrictions Precautions Precautions: Knee Restrictions Weight Bearing Restrictions: Yes RLE Weight Bearing: Weight bearing as tolerated      Mobility Bed Mobility                  Transfers                      Balance                                            ADL Overall ADL's : Needs assistance/impaired Eating/Feeding: Independent   Grooming: Wash/dry hands;Supervision/safety;Sitting;Standing   Upper Body Bathing: Set up;Sitting   Lower Body Bathing: Minimal assistance;Sit to/from stand   Upper Body Dressing : Set up;Sitting   Lower Body Dressing: Minimal assistance;Sit to/from stand                 General ADL Comments: Patient received in bed. Reports she just got back from toileting in bathroom. Reports nursing just stands by, no physical assistance required to perform toilet transfer and toileting with RW and comfort height toilet. Patient educated on LB dressing techniques and states her family can assist with LB self-care if needed. Patient also reports she plans to sponge bathe initially until she can lift her operated leg up high enough to clear side  of the tub. All education completed and no further OT needs.     Vision     Perception     Praxis      Pertinent Vitals/Pain Pain Assessment: 0-10 Pain Score: 5  Pain Location: right knee Pain Descriptors / Indicators: Aching;Sore Pain Intervention(s): Monitored during session;Limited activity within patient's tolerance     Hand Dominance Right   Extremity/Trunk Assessment Upper Extremity Assessment Upper Extremity Assessment: Overall WFL for tasks assessed   Lower Extremity Assessment Lower Extremity Assessment: Defer to PT evaluation       Communication Communication Communication: No difficulties   Cognition Arousal/Alertness: Awake/alert Behavior During Therapy: WFL for tasks assessed/performed Overall Cognitive Status: Within Functional Limits for tasks assessed                     General Comments       Exercises       Shoulder Instructions      Home Living Family/patient expects to be discharged to:: Private residence Living Arrangements: Alone Available Help at Discharge: Family;Available PRN/intermittently ((most of the time)) Type of Home: House Home Access: Stairs to enter [employer] of Steps: 2 Entrance Stairs-Rails: Right;Left Home Layout: One level     Bathroom Shower/Tub:  Tub/shower unit;Curtain   Bathroom Toilet: Handicapped height     Home Equipment: Environmental consultant - 2 wheels          Prior Functioning/Environment Level of Independence: Independent        Comments: pt works Arboriculturist    OT Diagnosis: Acute pain   OT Problem List: Decreased strength;Decreased range of motion;Decreased knowledge of use of DME or AE;Pain   OT Treatment/Interventions:      OT Goals(Current goals can be found in the care plan section) Acute Rehab OT Goals Patient Stated Goal: to go to daughter's home today OT Goal Formulation: All assessment and education complete, DC therapy  OT Frequency:     Barriers to D/C:             Co-evaluation              End of Session    Activity Tolerance: Patient tolerated treatment well Patient left: in bed;with call bell/phone within reach   Time: 0916-0931 OT Time Calculation (min): 15 min Charges:  OT General Charges $OT Visit: 1 Procedure OT Evaluation $Initial OT Evaluation Tier I: 1 Procedure G-Codes:    Torianna Junio A July 10, 2015, 9:39 AM

## 2015-06-29 NOTE — Progress Notes (Signed)
Patient discharged per orders. All d/c instructions and information discussed including: prescriptions, medications, follow up appointments, home/self care. Time allowed for questions and concerns. Patient will leave the unit via wheelchair with volunteer and  RN. Patient's daughter providing transportation. -  Roselyn Reef Abi Shoults,RN

## 2015-06-29 NOTE — Progress Notes (Signed)
Physical Therapy Treatment Patient Details Name: Leah Jensen MRN: [id number] DOB: 10-Dec-1945 Today's Date: 06/29/2015    History of Present Illness 69 y.o. female admitted to [facility] on 06/26/15 for elective R TKA.  Pt with significant PMHx of depression/anxiety, low back pain, and bipolar d/o.    PT Comments    Pt demonstrated ability to manage home stair mobility. Pt stated her daughter will be able to help her ambulate steps once home. Goniometry measurement was 14-70 degrees of flexion.   Follow Up Recommendations  Home health PT;Supervision for mobility/OOB     Equipment Recommendations  3in1 (PT)       Precautions / Restrictions Precautions Precautions: Knee Restrictions Weight Bearing Restrictions: Yes RLE Weight Bearing: Weight bearing as tolerated    Mobility  Bed Mobility Overal bed mobility: Modified Independent Bed Mobility: Supine to Sit (HOB elevated)     Supine to sit: Modified independent (Device/Increase time)     General bed mobility comments: Pt able to use compensatory strategies to lovwer RLE to get EOB.  Transfers Overall transfer level: Needs assistance Equipment used: Rolling walker (2 wheeled) Transfers: Sit to/from Stand Sit to Stand: Min guard         General transfer comment: Pt required min guard during sit to stand for safety.  Ambulation/Gait Ambulation/Gait assistance: Min guard;Supervision (Pt progressed to supervision during gait training) Ambulation Distance (Feet): 200 Feet Assistive device: Rolling walker (2 wheeled) Gait Pattern/deviations: Step-to pattern;Antalgic;Decreased stride length Gait velocity: decreased Gait velocity interpretation: <1.8 ft/sec, indicative of risk for recurrent falls General Gait Details: Pt required increased time during gait training due to "stiffniss/weakness" per pt statement.   Stairs Stairs: Yes Stairs assistance: Min assist Stair Management: One rail Right Number of Stairs: 3 (x 2  trials) General stair comments: Pt able to ascend and descend stairs with hand held assist to increase safety.                 Cognition Arousal/Alertness: Awake/alert Behavior During Therapy: WFL for tasks assessed/performed Overall Cognitive Status: Within Functional Limits for tasks assessed                      Exercises Total Joint Exercises Ankle Circles/Pumps: AROM;Both;20 reps;Seated Quad Sets: AROM;Right;10 reps;Seated Short Arc Quad: AROM;Right;10 reps;Seated Straight Leg Raises: AROM;Right;10 reps;Seated (Pt able to assist during eccentric phase during SLR.)        Pertinent Vitals/Pain Pain Assessment: 0-10 Pain Score: 6  Pain Location: right knee Pain Descriptors / Indicators: Aching;Sore Pain Intervention(s): Monitored during session    Home Living Family/patient expects to be discharged to:: Private residence Living Arrangements: Alone Available Help at Discharge: Family;Available PRN/intermittently ((most of the time)) Type of Home: House Home Access: Stairs to enter Entrance Stairs-Rails: Right;Left Home Layout: One level Home Equipment: Environmental consultant - 2 wheels      Prior Function Level of Independence: Independent      Comments: pt works delivering flowers   PT Goals (current goals can now be found in the care plan section) Acute Rehab PT Goals Patient Stated Goal: to go to daughter's home today Progress towards PT goals: Progressing toward goals    Frequency  7X/week    PT Plan Current plan remains appropriate            Time: 2376-2831 PT Time Calculation (min) (ACUTE ONLY): 46 min  Charges:      2 Gait [place], [place] OFFICE  06/29/2015, 11:53 AM

## 2015-06-29 NOTE — Progress Notes (Signed)
Subjective: 2 Days Post-Op Procedure(s) (LRB): TOTAL KNEE ARTHROPLASTY (Right) Patient reports pain as mild.  No nausea/vomiting, lightheadedness/dizziness, chest pain/sob.  Positive flatus/no bm.  Decreased appetite.  Objective: Vital signs in last 24 hours: Temp:  [97.5 F (36.4 C)-98.4 F (36.9 C)] 98.4 F (36.9 C) (10/28 0502) Pulse Rate:  [73-80] 77 (10/28 0502) Resp:  [16-18] 16 (10/28 0502) BP: (89-103)/(41-68) 94/50 mmHg (10/28 0502) SpO2:  [97 %-99 %] 99 % (10/28 0502)  Intake/Output from previous day:   Intake/Output this shift:     Recent Labs  06/28/15 0526 06/29/15 0435  HGB 11.8* 10.8*    Recent Labs  06/28/15 0526 06/29/15 0435  WBC 7.1 7.6  RBC 3.90 3.68*  HCT 37.0 34.1*  PLT 213 196    Recent Labs  06/28/15 0526 06/29/15 0435  NA 130* 137  K 3.7 4.0  CL 99* 106  CO2 26 27  BUN 6 9  CREATININE 0.75 0.66  GLUCOSE 112* 115*  CALCIUM 7.5* 8.3*   No results for input(s): LABPT, INR in the last 72 hours.  Neurologically intact Neurovascular intact Sensation intact distally Intact pulses distally Dorsiflexion/Plantar flexion intact Incision: dressing C/D/I No cellulitis present Compartment soft  Dressing changed by me today  Assessment/Plan: 2 Days Post-Op Procedure(s) (LRB): TOTAL KNEE ARTHROPLASTY (Right) Advance diet Up with therapy Discharge home with home health after first or second session of PT.  Daughter will be here to pick up patient around 1pm today. WBAT RLE ABLA-mild and stable Please place RLE ted hose prior to d/c Dry dressing change prn  Fannie Knee 06/29/2015, 7:37 AM

## 2015-06-30 DIAGNOSIS — G250 Essential tremor: Secondary | ICD-10-CM | POA: Diagnosis not present

## 2015-06-30 DIAGNOSIS — F329 Major depressive disorder, single episode, unspecified: Secondary | ICD-10-CM | POA: Diagnosis not present

## 2015-06-30 DIAGNOSIS — Z471 Aftercare following joint replacement surgery: Secondary | ICD-10-CM | POA: Diagnosis not present

## 2015-06-30 DIAGNOSIS — F319 Bipolar disorder, unspecified: Secondary | ICD-10-CM | POA: Diagnosis not present

## 2015-06-30 DIAGNOSIS — Z96651 Presence of right artificial knee joint: Secondary | ICD-10-CM | POA: Diagnosis not present

## 2015-06-30 DIAGNOSIS — F419 Anxiety disorder, unspecified: Secondary | ICD-10-CM | POA: Diagnosis not present

## 2015-06-30 DIAGNOSIS — M810 Age-related osteoporosis without current pathological fracture: Secondary | ICD-10-CM | POA: Diagnosis not present

## 2015-06-30 DIAGNOSIS — Z7902 Long term (current) use of antithrombotics/antiplatelets: Secondary | ICD-10-CM | POA: Diagnosis not present

## 2015-06-30 DIAGNOSIS — M545 Low back pain: Secondary | ICD-10-CM | POA: Diagnosis not present

## 2015-07-03 DIAGNOSIS — F329 Major depressive disorder, single episode, unspecified: Secondary | ICD-10-CM | POA: Diagnosis not present

## 2015-07-03 DIAGNOSIS — F319 Bipolar disorder, unspecified: Secondary | ICD-10-CM | POA: Diagnosis not present

## 2015-07-03 DIAGNOSIS — Z471 Aftercare following joint replacement surgery: Secondary | ICD-10-CM | POA: Diagnosis not present

## 2015-07-03 DIAGNOSIS — Z96651 Presence of right artificial knee joint: Secondary | ICD-10-CM | POA: Diagnosis not present

## 2015-07-03 DIAGNOSIS — M810 Age-related osteoporosis without current pathological fracture: Secondary | ICD-10-CM | POA: Diagnosis not present

## 2015-07-03 DIAGNOSIS — Z7902 Long term (current) use of antithrombotics/antiplatelets: Secondary | ICD-10-CM | POA: Diagnosis not present

## 2015-07-03 DIAGNOSIS — G250 Essential tremor: Secondary | ICD-10-CM | POA: Diagnosis not present

## 2015-07-03 DIAGNOSIS — F419 Anxiety disorder, unspecified: Secondary | ICD-10-CM | POA: Diagnosis not present

## 2015-07-03 DIAGNOSIS — M545 Low back pain: Secondary | ICD-10-CM | POA: Diagnosis not present

## 2015-07-04 DIAGNOSIS — F329 Major depressive disorder, single episode, unspecified: Secondary | ICD-10-CM | POA: Diagnosis not present

## 2015-07-04 DIAGNOSIS — Z96651 Presence of right artificial knee joint: Secondary | ICD-10-CM | POA: Diagnosis not present

## 2015-07-04 DIAGNOSIS — G250 Essential tremor: Secondary | ICD-10-CM | POA: Diagnosis not present

## 2015-07-04 DIAGNOSIS — M545 Low back pain: Secondary | ICD-10-CM | POA: Diagnosis not present

## 2015-07-04 DIAGNOSIS — Z471 Aftercare following joint replacement surgery: Secondary | ICD-10-CM | POA: Diagnosis not present

## 2015-07-04 DIAGNOSIS — Z7902 Long term (current) use of antithrombotics/antiplatelets: Secondary | ICD-10-CM | POA: Diagnosis not present

## 2015-07-04 DIAGNOSIS — F319 Bipolar disorder, unspecified: Secondary | ICD-10-CM | POA: Diagnosis not present

## 2015-07-04 DIAGNOSIS — M810 Age-related osteoporosis without current pathological fracture: Secondary | ICD-10-CM | POA: Diagnosis not present

## 2015-07-04 DIAGNOSIS — F419 Anxiety disorder, unspecified: Secondary | ICD-10-CM | POA: Diagnosis not present

## 2015-07-06 DIAGNOSIS — M810 Age-related osteoporosis without current pathological fracture: Secondary | ICD-10-CM | POA: Diagnosis not present

## 2015-07-06 DIAGNOSIS — F419 Anxiety disorder, unspecified: Secondary | ICD-10-CM | POA: Diagnosis not present

## 2015-07-06 DIAGNOSIS — F329 Major depressive disorder, single episode, unspecified: Secondary | ICD-10-CM | POA: Diagnosis not present

## 2015-07-06 DIAGNOSIS — Z471 Aftercare following joint replacement surgery: Secondary | ICD-10-CM | POA: Diagnosis not present

## 2015-07-06 DIAGNOSIS — F319 Bipolar disorder, unspecified: Secondary | ICD-10-CM | POA: Diagnosis not present

## 2015-07-06 DIAGNOSIS — M545 Low back pain: Secondary | ICD-10-CM | POA: Diagnosis not present

## 2015-07-06 DIAGNOSIS — G250 Essential tremor: Secondary | ICD-10-CM | POA: Diagnosis not present

## 2015-07-06 DIAGNOSIS — Z7902 Long term (current) use of antithrombotics/antiplatelets: Secondary | ICD-10-CM | POA: Diagnosis not present

## 2015-07-06 DIAGNOSIS — Z96651 Presence of right artificial knee joint: Secondary | ICD-10-CM | POA: Diagnosis not present

## 2015-07-09 DIAGNOSIS — Z7902 Long term (current) use of antithrombotics/antiplatelets: Secondary | ICD-10-CM | POA: Diagnosis not present

## 2015-07-09 DIAGNOSIS — M545 Low back pain: Secondary | ICD-10-CM | POA: Diagnosis not present

## 2015-07-09 DIAGNOSIS — G250 Essential tremor: Secondary | ICD-10-CM | POA: Diagnosis not present

## 2015-07-09 DIAGNOSIS — Z471 Aftercare following joint replacement surgery: Secondary | ICD-10-CM | POA: Diagnosis not present

## 2015-07-09 DIAGNOSIS — F419 Anxiety disorder, unspecified: Secondary | ICD-10-CM | POA: Diagnosis not present

## 2015-07-09 DIAGNOSIS — F329 Major depressive disorder, single episode, unspecified: Secondary | ICD-10-CM | POA: Diagnosis not present

## 2015-07-09 DIAGNOSIS — F319 Bipolar disorder, unspecified: Secondary | ICD-10-CM | POA: Diagnosis not present

## 2015-07-09 DIAGNOSIS — M810 Age-related osteoporosis without current pathological fracture: Secondary | ICD-10-CM | POA: Diagnosis not present

## 2015-07-09 DIAGNOSIS — Z96651 Presence of right artificial knee joint: Secondary | ICD-10-CM | POA: Diagnosis not present

## 2015-07-10 DIAGNOSIS — Z96651 Presence of right artificial knee joint: Secondary | ICD-10-CM | POA: Diagnosis not present

## 2015-07-10 DIAGNOSIS — M1712 Unilateral primary osteoarthritis, left knee: Secondary | ICD-10-CM | POA: Diagnosis not present

## 2015-07-11 DIAGNOSIS — Z96651 Presence of right artificial knee joint: Secondary | ICD-10-CM | POA: Diagnosis not present

## 2015-07-11 DIAGNOSIS — F419 Anxiety disorder, unspecified: Secondary | ICD-10-CM | POA: Diagnosis not present

## 2015-07-11 DIAGNOSIS — Z7902 Long term (current) use of antithrombotics/antiplatelets: Secondary | ICD-10-CM | POA: Diagnosis not present

## 2015-07-11 DIAGNOSIS — F329 Major depressive disorder, single episode, unspecified: Secondary | ICD-10-CM | POA: Diagnosis not present

## 2015-07-11 DIAGNOSIS — M810 Age-related osteoporosis without current pathological fracture: Secondary | ICD-10-CM | POA: Diagnosis not present

## 2015-07-11 DIAGNOSIS — Z471 Aftercare following joint replacement surgery: Secondary | ICD-10-CM | POA: Diagnosis not present

## 2015-07-11 DIAGNOSIS — G250 Essential tremor: Secondary | ICD-10-CM | POA: Diagnosis not present

## 2015-07-11 DIAGNOSIS — M545 Low back pain: Secondary | ICD-10-CM | POA: Diagnosis not present

## 2015-07-11 DIAGNOSIS — F319 Bipolar disorder, unspecified: Secondary | ICD-10-CM | POA: Diagnosis not present

## 2015-07-13 DIAGNOSIS — Z96651 Presence of right artificial knee joint: Secondary | ICD-10-CM | POA: Diagnosis not present

## 2015-07-13 DIAGNOSIS — F319 Bipolar disorder, unspecified: Secondary | ICD-10-CM | POA: Diagnosis not present

## 2015-07-13 DIAGNOSIS — M545 Low back pain: Secondary | ICD-10-CM | POA: Diagnosis not present

## 2015-07-13 DIAGNOSIS — G250 Essential tremor: Secondary | ICD-10-CM | POA: Diagnosis not present

## 2015-07-13 DIAGNOSIS — F419 Anxiety disorder, unspecified: Secondary | ICD-10-CM | POA: Diagnosis not present

## 2015-07-13 DIAGNOSIS — Z7902 Long term (current) use of antithrombotics/antiplatelets: Secondary | ICD-10-CM | POA: Diagnosis not present

## 2015-07-13 DIAGNOSIS — F329 Major depressive disorder, single episode, unspecified: Secondary | ICD-10-CM | POA: Diagnosis not present

## 2015-07-13 DIAGNOSIS — Z471 Aftercare following joint replacement surgery: Secondary | ICD-10-CM | POA: Diagnosis not present

## 2015-07-13 DIAGNOSIS — M810 Age-related osteoporosis without current pathological fracture: Secondary | ICD-10-CM | POA: Diagnosis not present

## 2015-07-16 DIAGNOSIS — M25561 Pain in right knee: Secondary | ICD-10-CM | POA: Diagnosis not present

## 2015-07-16 DIAGNOSIS — R262 Difficulty in walking, not elsewhere classified: Secondary | ICD-10-CM | POA: Diagnosis not present

## 2015-07-16 DIAGNOSIS — R531 Weakness: Secondary | ICD-10-CM | POA: Diagnosis not present

## 2015-07-16 DIAGNOSIS — M25661 Stiffness of right knee, not elsewhere classified: Secondary | ICD-10-CM | POA: Diagnosis not present

## 2015-07-17 DIAGNOSIS — M1712 Unilateral primary osteoarthritis, left knee: Secondary | ICD-10-CM | POA: Diagnosis not present

## 2015-07-18 DIAGNOSIS — M25561 Pain in right knee: Secondary | ICD-10-CM | POA: Diagnosis not present

## 2015-07-18 DIAGNOSIS — M25661 Stiffness of right knee, not elsewhere classified: Secondary | ICD-10-CM | POA: Diagnosis not present

## 2015-07-18 DIAGNOSIS — R262 Difficulty in walking, not elsewhere classified: Secondary | ICD-10-CM | POA: Diagnosis not present

## 2015-07-18 DIAGNOSIS — R531 Weakness: Secondary | ICD-10-CM | POA: Diagnosis not present

## 2015-07-24 DIAGNOSIS — M1712 Unilateral primary osteoarthritis, left knee: Secondary | ICD-10-CM | POA: Diagnosis not present

## 2015-07-31 DIAGNOSIS — R262 Difficulty in walking, not elsewhere classified: Secondary | ICD-10-CM | POA: Diagnosis not present

## 2015-07-31 DIAGNOSIS — R531 Weakness: Secondary | ICD-10-CM | POA: Diagnosis not present

## 2015-07-31 DIAGNOSIS — M25661 Stiffness of right knee, not elsewhere classified: Secondary | ICD-10-CM | POA: Diagnosis not present

## 2015-07-31 DIAGNOSIS — M25561 Pain in right knee: Secondary | ICD-10-CM | POA: Diagnosis not present

## 2015-08-02 DIAGNOSIS — M25561 Pain in right knee: Secondary | ICD-10-CM | POA: Diagnosis not present

## 2015-08-02 DIAGNOSIS — M25661 Stiffness of right knee, not elsewhere classified: Secondary | ICD-10-CM | POA: Diagnosis not present

## 2015-08-02 DIAGNOSIS — R531 Weakness: Secondary | ICD-10-CM | POA: Diagnosis not present

## 2015-08-02 DIAGNOSIS — R262 Difficulty in walking, not elsewhere classified: Secondary | ICD-10-CM | POA: Diagnosis not present

## 2015-08-06 DIAGNOSIS — M25661 Stiffness of right knee, not elsewhere classified: Secondary | ICD-10-CM | POA: Diagnosis not present

## 2015-08-06 DIAGNOSIS — M25561 Pain in right knee: Secondary | ICD-10-CM | POA: Diagnosis not present

## 2015-08-06 DIAGNOSIS — R262 Difficulty in walking, not elsewhere classified: Secondary | ICD-10-CM | POA: Diagnosis not present

## 2015-08-06 DIAGNOSIS — R531 Weakness: Secondary | ICD-10-CM | POA: Diagnosis not present

## 2015-08-07 DIAGNOSIS — Z96652 Presence of left artificial knee joint: Secondary | ICD-10-CM | POA: Diagnosis not present

## 2015-08-09 DIAGNOSIS — R531 Weakness: Secondary | ICD-10-CM | POA: Diagnosis not present

## 2015-08-09 DIAGNOSIS — M25661 Stiffness of right knee, not elsewhere classified: Secondary | ICD-10-CM | POA: Diagnosis not present

## 2015-08-09 DIAGNOSIS — R262 Difficulty in walking, not elsewhere classified: Secondary | ICD-10-CM | POA: Diagnosis not present

## 2015-08-09 DIAGNOSIS — M25561 Pain in right knee: Secondary | ICD-10-CM | POA: Diagnosis not present

## 2015-08-13 ENCOUNTER — Telehealth: Payer: Self-pay

## 2015-08-13 NOTE — Telephone Encounter (Signed)
Called patient about flu shot appointment. Left message to call us back

## 2015-08-14 DIAGNOSIS — R531 Weakness: Secondary | ICD-10-CM | POA: Diagnosis not present

## 2015-08-14 DIAGNOSIS — R262 Difficulty in walking, not elsewhere classified: Secondary | ICD-10-CM | POA: Diagnosis not present

## 2015-08-14 DIAGNOSIS — M25561 Pain in right knee: Secondary | ICD-10-CM | POA: Diagnosis not present

## 2015-08-14 DIAGNOSIS — M25661 Stiffness of right knee, not elsewhere classified: Secondary | ICD-10-CM | POA: Diagnosis not present

## 2015-08-15 DIAGNOSIS — S61451A Open bite of right hand, initial encounter: Secondary | ICD-10-CM | POA: Diagnosis not present

## 2015-08-15 DIAGNOSIS — M19041 Primary osteoarthritis, right hand: Secondary | ICD-10-CM | POA: Diagnosis not present

## 2015-08-15 DIAGNOSIS — W540XXA Bitten by dog, initial encounter: Secondary | ICD-10-CM | POA: Diagnosis not present

## 2015-08-16 ENCOUNTER — Telehealth: Payer: Self-pay | Admitting: Internal Medicine

## 2015-08-16 DIAGNOSIS — M19041 Primary osteoarthritis, right hand: Secondary | ICD-10-CM | POA: Diagnosis not present

## 2015-08-16 DIAGNOSIS — R531 Weakness: Secondary | ICD-10-CM | POA: Diagnosis not present

## 2015-08-16 DIAGNOSIS — M25561 Pain in right knee: Secondary | ICD-10-CM | POA: Diagnosis not present

## 2015-08-16 DIAGNOSIS — M25661 Stiffness of right knee, not elsewhere classified: Secondary | ICD-10-CM | POA: Diagnosis not present

## 2015-08-16 DIAGNOSIS — S61451A Open bite of right hand, initial encounter: Secondary | ICD-10-CM | POA: Diagnosis not present

## 2015-08-16 DIAGNOSIS — W540XXA Bitten by dog, initial encounter: Secondary | ICD-10-CM | POA: Diagnosis not present

## 2015-08-16 DIAGNOSIS — R262 Difficulty in walking, not elsewhere classified: Secondary | ICD-10-CM | POA: Diagnosis not present

## 2015-08-16 NOTE — Telephone Encounter (Signed)
Pt called state that she wen to [facility] ER in [place] last night for a dog bite. Pt stated they treated her and gave her medication to take. Pt wants to give him code if we wants to go in and look up the records: [email or web address] and the code to look up record is [id number]. Please call her if we have any question.

## 2015-08-16 NOTE — Telephone Encounter (Signed)
Noted, thanks!

## 2015-09-18 DIAGNOSIS — Z96651 Presence of right artificial knee joint: Secondary | ICD-10-CM | POA: Diagnosis not present

## 2015-10-12 ENCOUNTER — Telehealth: Payer: Self-pay | Admitting: Internal Medicine

## 2015-10-12 NOTE — Telephone Encounter (Signed)
Pt request [employer] referral to go to Dr. Kendall Flack, OBGYN, she has an appt on 11/28/15. Please help

## 2015-10-15 NOTE — Telephone Encounter (Signed)
Josem Kaufmann [id number] valid 11/28/2015 - 05/26/2016 for 6 visits

## 2015-11-15 DIAGNOSIS — F329 Major depressive disorder, single episode, unspecified: Secondary | ICD-10-CM | POA: Diagnosis not present

## 2015-11-22 ENCOUNTER — Other Ambulatory Visit (INDEPENDENT_AMBULATORY_CARE_PROVIDER_SITE_OTHER): Payer: Commercial Managed Care - HMO

## 2015-11-22 ENCOUNTER — Ambulatory Visit (INDEPENDENT_AMBULATORY_CARE_PROVIDER_SITE_OTHER): Payer: Commercial Managed Care - HMO | Admitting: Internal Medicine

## 2015-11-22 ENCOUNTER — Ambulatory Visit: Payer: Commercial Managed Care - HMO

## 2015-11-22 ENCOUNTER — Encounter: Payer: Self-pay | Admitting: Internal Medicine

## 2015-11-22 VITALS — BP 110/62 | HR 64 | Temp 98.8°F | Resp 16 | Ht 64.5 in | Wt 154.8 lb

## 2015-11-22 DIAGNOSIS — Z0000 Encounter for general adult medical examination without abnormal findings: Secondary | ICD-10-CM | POA: Diagnosis not present

## 2015-11-22 DIAGNOSIS — I872 Venous insufficiency (chronic) (peripheral): Secondary | ICD-10-CM | POA: Diagnosis not present

## 2015-11-22 DIAGNOSIS — M810 Age-related osteoporosis without current pathological fracture: Secondary | ICD-10-CM

## 2015-11-22 DIAGNOSIS — R7989 Other specified abnormal findings of blood chemistry: Secondary | ICD-10-CM

## 2015-11-22 LAB — COMPREHENSIVE METABOLIC PANEL
ALBUMIN: 4 g/dL (ref 3.5–5.2)
ALT: 14 U/L (ref 0–35)
AST: 17 U/L (ref 0–37)
Alkaline Phosphatase: 63 U/L (ref 39–117)
BILIRUBIN TOTAL: 0.4 mg/dL (ref 0.2–1.2)
BUN: 8 mg/dL (ref 6–23)
CO2: 28 meq/L (ref 19–32)
CREATININE: 0.83 mg/dL (ref 0.40–1.20)
Calcium: 9.5 mg/dL (ref 8.4–10.5)
Chloride: 103 mEq/L (ref 96–112)
GFR: 72.16 mL/min (ref >60.00)
GLUCOSE: 92 mg/dL (ref 70–99)
Potassium: 4.5 mEq/L (ref 3.5–5.1)
SODIUM: 139 meq/L (ref 135–145)
TOTAL PROTEIN: 7.1 g/dL (ref 6.0–8.3)

## 2015-11-22 LAB — CBC
HEMATOCRIT: 40.7 % (ref 36.0–46.0)
Hemoglobin: 13.7 g/dL (ref 12.0–15.0)
MCHC: 33.7 g/dL (ref 30.0–36.0)
MCV: 87.5 fl (ref 78.0–100.0)
Platelets: 281 10*3/uL (ref 150.0–400.0)
RBC: 4.65 Mil/uL (ref 3.87–5.11)
RDW: 14.3 % (ref 11.5–15.5)
WBC: 5.2 10*3/uL (ref 4.0–10.5)

## 2015-11-22 LAB — LIPID PANEL
Cholesterol: 271 mg/dL — ABNORMAL HIGH (ref 0–200)
HDL: 64.4 mg/dL (ref >39.00)
NONHDL: 206.97
Total CHOL/HDL Ratio: 4
Triglycerides: 217 mg/dL — ABNORMAL HIGH (ref 0.0–149.0)
VLDL: 43.4 mg/dL — AB (ref 0.0–40.0)

## 2015-11-22 LAB — LDL CHOLESTEROL, DIRECT: LDL DIRECT: 70 mg/dL

## 2015-11-22 LAB — HEMOGLOBIN A1C: Hgb A1c MFr Bld: 5.7 % (ref 4.6–6.5)

## 2015-11-22 NOTE — Patient Instructions (Signed)
We will check the labs today and call you back with the results.   Come back in about 6-12 months or please feel free to call us sooner if you have any problems or questions.   Health Maintenance, Female Adopting a healthy lifestyle and getting preventive care can go a long way to promote health and wellness. Talk with your health care provider about what schedule of regular examinations is right for you. This is a good chance for you to check in with your provider about disease prevention and staying healthy. In between checkups, there are plenty of things you can do on your own. Experts have done a lot of research about which lifestyle changes and preventive measures are most likely to keep you healthy. Ask your health care provider for more information. WEIGHT AND DIET  Eat a healthy diet  Be sure to include plenty of vegetables, fruits, low-fat dairy products, and lean protein.  Do not eat a lot of foods high in solid fats, added sugars, or salt.  Get regular exercise. This is one of the most important things you can do for your health.  Most adults should exercise for at least 150 minutes each week. The exercise should increase your heart rate and make you sweat (moderate-intensity exercise).  Most adults should also do strengthening exercises at least twice a week. This is in addition to the moderate-intensity exercise.  Maintain a healthy weight  Body mass index (BMI) is a measurement that can be used to identify possible weight problems. It estimates body fat based on height and weight. Your health care provider can help determine your BMI and help you achieve or maintain a healthy weight.  For females 20 years of age and older:   A BMI below 18.5 is considered underweight.  A BMI of 18.5 to 24.9 is normal.  A BMI of 25 to 29.9 is considered overweight.  A BMI of 30 and above is considered obese.  Watch levels of cholesterol and blood lipids  You should start having your  blood tested for lipids and cholesterol at 71 years of age, then have this test every 5 years.  You may need to have your cholesterol levels checked more often if:  Your lipid or cholesterol levels are high.  You are older than 71 years of age.  You are at high risk for heart disease.  CANCER SCREENING   Lung Cancer  Lung cancer screening is recommended for adults 61-76 years old who are at high risk for lung cancer because of a history of smoking.  A yearly low-dose CT scan of the lungs is recommended for people who:  Currently smoke.  Have quit within the past 15 years.  Have at least a 30-pack-year history of smoking. A pack year is smoking an average of one pack of cigarettes a day for 1 year.  Yearly screening should continue until it has been 15 years since you quit.  Yearly screening should stop if you develop a health problem that would prevent you from having lung cancer treatment.  Breast Cancer  Practice breast self-awareness. This means understanding how your breasts normally appear and feel.  It also means doing regular breast self-exams. Let your health care provider know about any changes, no matter how small.  If you are in your 20s or 30s, you should have a clinical breast exam (CBE) by a health care provider every 1-3 years as part of a regular health exam.  If you are 40  or older, have a CBE every year. Also consider having a breast X-ray (mammogram) every year.  If you have a family history of breast cancer, talk to your health care provider about genetic screening.  If you are at high risk for breast cancer, talk to your health care provider about having an MRI and a mammogram every year.  Breast cancer gene (BRCA) assessment is recommended for women who have family members with BRCA-related cancers. BRCA-related cancers include:  Breast.  Ovarian.  Tubal.  Peritoneal cancers.  Results of the assessment will determine the need for genetic  counseling and BRCA1 and BRCA2 testing. Cervical Cancer Your health care provider may recommend that you be screened regularly for cancer of the pelvic organs (ovaries, uterus, and vagina). This screening involves a pelvic examination, including checking for microscopic changes to the surface of your cervix (Pap test). You may be encouraged to have this screening done every 3 years, beginning at age 12.  For women ages 84-65, health care providers may recommend pelvic exams and Pap testing every 3 years, or they may recommend the Pap and pelvic exam, combined with testing for human papilloma virus (HPV), every 5 years. Some types of HPV increase your risk of cervical cancer. Testing for HPV may also be done on women of any age with unclear Pap test results.  Other health care providers may not recommend any screening for nonpregnant women who are considered low risk for pelvic cancer and who do not have symptoms. Ask your health care provider if a screening pelvic exam is right for you.  If you have had past treatment for cervical cancer or a condition that could lead to cancer, you need Pap tests and screening for cancer for at least 20 years after your treatment. If Pap tests have been discontinued, your risk factors (such as having a new sexual partner) need to be reassessed to determine if screening should resume. Some women have medical problems that increase the chance of getting cervical cancer. In these cases, your health care provider may recommend more frequent screening and Pap tests. Colorectal Cancer  This type of cancer can be detected and often prevented.  Routine colorectal cancer screening usually begins at 71 years of age and continues through 71 years of age.  Your health care provider may recommend screening at an earlier age if you have risk factors for colon cancer.  Your health care provider may also recommend using home test kits to check for hidden blood in the stool.  A  small camera at the end of a tube can be used to examine your colon directly (sigmoidoscopy or colonoscopy). This is done to check for the earliest forms of colorectal cancer.  Routine screening usually begins at age 49.  Direct examination of the colon should be repeated every 5-10 years through 71 years of age. However, you may need to be screened more often if early forms of precancerous polyps or small growths are found. Skin Cancer  Check your skin from head to toe regularly.  Tell your health care provider about any new moles or changes in moles, especially if there is a change in a mole's shape or color.  Also tell your health care provider if you have a mole that is larger than the size of a pencil eraser.  Always use sunscreen. Apply sunscreen liberally and repeatedly throughout the day.  Protect yourself by wearing long sleeves, pants, a wide-brimmed hat, and sunglasses whenever you are outside. HEART DISEASE,  DIABETES, AND HIGH BLOOD PRESSURE   High blood pressure causes heart disease and increases the risk of stroke. High blood pressure is more likely to develop in:  People who have blood pressure in the high end of the normal range (130-139/85-89 mm Hg).  People who are overweight or obese.  People who are African American.  If you are 42-41 years of age, have your blood pressure checked every 3-5 years. If you are 38 years of age or older, have your blood pressure checked every year. You should have your blood pressure measured twice--once when you are at a hospital or clinic, and once when you are not at a hospital or clinic. Record the average of the two measurements. To check your blood pressure when you are not at a hospital or clinic, you can use:  An automated blood pressure machine at a pharmacy.  A home blood pressure monitor.  If you are between 10 years and 87 years old, ask your health care provider if you should take aspirin to prevent strokes.  Have  regular diabetes screenings. This involves taking a blood sample to check your fasting blood sugar level.  If you are at a normal weight and have a low risk for diabetes, have this test once every three years after 71 years of age.  If you are overweight and have a high risk for diabetes, consider being tested at a younger age or more often. PREVENTING INFECTION  Hepatitis B  If you have a higher risk for hepatitis B, you should be screened for this virus. You are considered at high risk for hepatitis B if:  You were born in a country where hepatitis B is common. Ask your health care provider which countries are considered high risk.  Your parents were born in a high-risk country, and you have not been immunized against hepatitis B (hepatitis B vaccine).  You have HIV or AIDS.  You use needles to inject street drugs.  You live with someone who has hepatitis B.  You have had sex with someone who has hepatitis B.  You get hemodialysis treatment.  You take certain medicines for conditions, including cancer, organ transplantation, and autoimmune conditions. Hepatitis C  Blood testing is recommended for:  Everyone born from [birth year] through 1966.  Anyone with known risk factors for hepatitis C. Sexually transmitted infections (STIs)  You should be screened for sexually transmitted infections (STIs) including gonorrhea and chlamydia if:  You are sexually active and are younger than 71 years of age.  You are older than 71 years of age and your health care provider tells you that you are at risk for this type of infection.  Your sexual activity has changed since you were last screened and you are at an increased risk for chlamydia or gonorrhea. Ask your health care provider if you are at risk.  If you do not have HIV, but are at risk, it may be recommended that you take a prescription medicine daily to prevent HIV infection. This is called pre-exposure prophylaxis (PrEP). You are  considered at risk if:  You are sexually active and do not regularly use condoms or know the HIV status of your partner(s).  You take drugs by injection.  You are sexually active with a partner who has HIV. Talk with your health care provider about whether you are at high risk of being infected with HIV. If you choose to begin PrEP, you should first be tested for HIV. You should then  be tested every 3 months for as long as you are taking PrEP.  PREGNANCY   If you are premenopausal and you may become pregnant, ask your health care provider about preconception counseling.  If you may become pregnant, take 400 to 800 micrograms (mcg) of folic acid every day.  If you want to prevent pregnancy, talk to your health care provider about birth control (contraception). OSTEOPOROSIS AND MENOPAUSE   Osteoporosis is a disease in which the bones lose minerals and strength with aging. This can result in serious bone fractures. Your risk for osteoporosis can be identified using a bone density scan.  If you are 33 years of age or older, or if you are at risk for osteoporosis and fractures, ask your health care provider if you should be screened.  Ask your health care provider whether you should take a calcium or vitamin D supplement to lower your risk for osteoporosis.  Menopause may have certain physical symptoms and risks.  Hormone replacement therapy may reduce some of these symptoms and risks. Talk to your health care provider about whether hormone replacement therapy is right for you.  HOME CARE INSTRUCTIONS   Schedule regular health, dental, and eye exams.  Stay current with your immunizations.   Do not use any tobacco products including cigarettes, chewing tobacco, or electronic cigarettes.  If you are pregnant, do not drink alcohol.  If you are breastfeeding, limit how much and how often you drink alcohol.  Limit alcohol intake to no more than 1 drink per day for nonpregnant women. One  drink equals 12 ounces of beer, 5 ounces of wine, or 1 ounces of hard liquor.  Do not use street drugs.  Do not share needles.  Ask your health care provider for help if you need support or information about quitting drugs.  Tell your health care provider if you often feel depressed.  Tell your health care provider if you have ever been abused or do not feel safe at home.   This information is not intended to replace advice given to you by your health care provider. Make sure you discuss any questions you have with your health care provider.   Document Released: 03/03/2011 Document Revised: 09/08/2014 Document Reviewed: 07/20/2013 Elsevier Interactive Patient Education [employer].

## 2015-11-22 NOTE — Progress Notes (Signed)
   Subjective:    Patient ID: Leah Jensen, female    DOB: Jan 12, 1946, 70 y.o.   MRN: [id number]  HPI Here for medicare wellness, no new complaints. Please see A/P for status and treatment of chronic medical problems.   Diet: heart healthy Physical activity: sedentary Depression/mood screen: controlled with treatment Hearing: intact to whispered voice Visual acuity: grossly normal, performs annual eye exam  ADLs: capable Fall risk: none Home safety: good Cognitive evaluation: intact to orientation, naming, recall and repetition EOL planning: adv directives discussed  I have personally reviewed and have noted 1. The patient's medical and social history - reviewed today no changes 2. Their use of alcohol, tobacco or illicit drugs 3. Their current medications and supplements 4. The patient's functional ability including ADL's, fall risks, home safety risks and hearing or visual impairment. 5. Diet and physical activities 6. Evidence for depression or mood disorders 7. Care team reviewed and updated (available in snapshot)  Review of Systems  Constitutional: Negative for fever, activity change, appetite change and fatigue.  HENT: Negative.   Eyes: Negative.   Respiratory: Negative for cough, chest tightness, shortness of breath and wheezing.   Cardiovascular: Negative for chest pain, palpitations and leg swelling.  Gastrointestinal: Negative.   Musculoskeletal: Negative.   Skin: Negative.   Neurological: Negative.   Psychiatric/Behavioral: Negative.       Objective:   Physical Exam  Constitutional: She is oriented to person, place, and time. She appears well-developed and well-nourished.  HENT:  Head: Normocephalic and atraumatic.  Eyes: EOM are normal.  Neck: No JVD present.  Cardiovascular: Normal rate and regular rhythm.   Pulmonary/Chest: Effort normal and breath sounds normal. No respiratory distress. She has no wheezes. She has no rales.  Abdominal: Soft. She  exhibits no distension. There is no tenderness.  Musculoskeletal: She exhibits edema.  1+ edema bilaterally  Neurological: She is alert and oriented to person, place, and time.  Skin: Skin is warm and dry.   Filed Vitals:   11/22/15 1529  BP: 110/62  Pulse: 64  Temp: 98.8 F (37.1 C)  TempSrc: Oral  Resp: 16  Height: 5' 4.5" (1.638 m)  Weight: 154 lb 12.8 oz (70.217 kg)  SpO2: 96%      Assessment & Plan:

## 2015-11-22 NOTE — Progress Notes (Signed)
Pre visit review using our clinic review tool, if applicable. No additional management support is needed unless otherwise documented below in the visit note. 

## 2015-11-23 ENCOUNTER — Encounter: Payer: Self-pay | Admitting: Internal Medicine

## 2015-11-23 NOTE — Assessment & Plan Note (Signed)
Checking labs, immunizations are up to date. Colonoscopy up to date and mammogram up to date. Counseled on sun safety and need for weight bearing activities.

## 2015-11-23 NOTE — Assessment & Plan Note (Signed)
She does well with propping up feet and stockings that are tight. No ulcers, good pulses.

## 2015-11-23 NOTE — Assessment & Plan Note (Signed)
She took prolia once and then did not continue, we did discuss that she should as her bone density is low. She does weight bearing exercises.

## 2015-11-28 DIAGNOSIS — M810 Age-related osteoporosis without current pathological fracture: Secondary | ICD-10-CM | POA: Diagnosis not present

## 2015-11-28 DIAGNOSIS — Z6826 Body mass index (BMI) 26.0-26.9, adult: Secondary | ICD-10-CM | POA: Diagnosis not present

## 2015-11-28 DIAGNOSIS — Z01419 Encounter for gynecological examination (general) (routine) without abnormal findings: Secondary | ICD-10-CM | POA: Diagnosis not present

## 2015-11-28 DIAGNOSIS — N951 Menopausal and female climacteric states: Secondary | ICD-10-CM | POA: Diagnosis not present

## 2016-01-04 ENCOUNTER — Ambulatory Visit (INDEPENDENT_AMBULATORY_CARE_PROVIDER_SITE_OTHER): Payer: Commercial Managed Care - HMO | Admitting: Family Medicine

## 2016-01-04 VITALS — BP 122/72 | HR 69 | Temp 98.5°F | Resp 16 | Ht 63.5 in | Wt 154.0 lb

## 2016-01-04 DIAGNOSIS — L538 Other specified erythematous conditions: Secondary | ICD-10-CM | POA: Diagnosis not present

## 2016-01-04 DIAGNOSIS — W57XXXA Bitten or stung by nonvenomous insect and other nonvenomous arthropods, initial encounter: Secondary | ICD-10-CM

## 2016-01-04 MED ORDER — DOXYCYCLINE HYCLATE 100 MG PO TABS: 100.0000 mg | ORAL_TABLET | Freq: Two times a day (BID) | ORAL | Status: AC

## 2016-01-04 NOTE — Patient Instructions (Addendum)
Thank you for coming in today. Take doxycycline twice daily for 1 week.  Return as needed.   Tick Bite Information Ticks are insects that attach themselves to the skin and draw blood for food. There are various types of ticks. Common types include wood ticks and deer ticks. Most ticks live in shrubs and grassy areas. Ticks can climb onto your body when you make contact with leaves or grass where the tick is waiting. The most common places on the body for ticks to attach themselves are the scalp, neck, armpits, waist, and groin. Most tick bites are harmless, but sometimes ticks carry germs that cause diseases. These germs can be spread to a person during the tick's feeding process. The chance of a disease spreading through a tick bite depends on:   The type of tick.  Time of year.   How long the tick is attached.   Geographic location.  HOW CAN YOU PREVENT TICK BITES? Take these steps to help prevent tick bites when you are outdoors:  Wear protective clothing. Long sleeves and long pants are best.   Wear white clothes so you can see ticks more easily.  Tuck your pant legs into your socks.   If walking on a trail, stay in the middle of the trail to avoid brushing against bushes.  Avoid walking through areas with long grass.  Put insect repellent on all exposed skin and along boot tops, pant legs, and sleeve cuffs.   Check clothing, hair, and skin repeatedly and before going inside.   Brush off any ticks that are not attached.  Take a shower or bath as soon as possible after being outdoors.  WHAT IS THE PROPER WAY TO REMOVE A TICK? Ticks should be removed as soon as possible to help prevent diseases caused by tick bites. 1. If latex gloves are available, put them on before trying to remove a tick.  2. Using fine-point tweezers, grasp the tick as close to the skin as possible. You may also use curved forceps or a tick removal tool. Grasp the tick as close to its head as  possible. Avoid grasping the tick on its body. 3. Pull gently with steady upward pressure until the tick lets go. Do not twist the tick or jerk it suddenly. This may break off the tick's head or mouth parts. 4. Do not squeeze or crush the tick's body. This could force disease-carrying fluids from the tick into your body.  5. After the tick is removed, wash the bite area and your hands with soap and water or other disinfectant such as alcohol. 6. Apply a small amount of antiseptic cream or ointment to the bite site.  7. Wash and disinfect any instruments that were used.  Do not try to remove a tick by applying a hot match, petroleum jelly, or fingernail polish to the tick. These methods do not work and may increase the chances of disease being spread from the tick bite.  WHEN SHOULD YOU SEEK MEDICAL CARE? Contact your health care provider if you are unable to remove a tick from your skin or if a part of the tick breaks off and is stuck in the skin.  After a tick bite, you need to be aware of signs and symptoms that could be related to diseases spread by ticks. Contact your health care provider if you develop any of the following in the days or weeks after the tick bite:  Unexplained fever.  Rash. A circular rash that  appears days or weeks after the tick bite may indicate the possibility of Lyme disease. The rash may resemble a target with a bull's-eye and may occur at a different part of your body than the tick bite.  Redness and swelling in the area of the tick bite.   Tender, swollen lymph glands.   Diarrhea.   Weight loss.   Cough.   Fatigue.   Muscle, joint, or bone pain.   Abdominal pain.   Headache.   Lethargy or a change in your level of consciousness.  Difficulty walking or moving your legs.   Numbness in the legs.   Paralysis.  Shortness of breath.   Confusion.   Repeated vomiting.    This information is not intended to replace advice given to  you by your health care provider. Make sure you discuss any questions you have with your health care provider.   Document Released: 08/15/2000 Document Revised: 09/08/2014 Document Reviewed: 01/26/2013 Elsevier Interactive Patient Education 2016 [employer].     IF you received an x-ray today, you will receive an invoice from [facility]. Please contact [facility] at [phone] with questions or concerns regarding your invoice.   IF you received labwork today, you will receive an invoice from [employer]. Please contact [facility] at [phone] with questions or concerns regarding your invoice.   Our billing staff will not be able to assist you with questions regarding bills from these companies.  You will be contacted with the lab results as soon as they are available. The fastest way to get your results is to activate your My Chart account. Instructions are located on the last page of this paperwork. If you have not heard from [place] regarding the results in 2 weeks, please contact this office.

## 2016-01-04 NOTE — Progress Notes (Signed)
Leah Jensen is a 70 y.o. female who presents to Urgent Care today for tonight. Patient discovered a tick on her back 2 days ago. She was exposed to ticks a few days before that. She notes that she removed the tick successfully but the skin has become red and mildly tender. She denies any fevers chills nausea vomiting or diarrhea. She has not tried any medications yet and feels well otherwise.   Past Medical History  Diagnosis Date  . ALLERGIC RHINITIS   . DEPRESSION/ANXIETY   . TREMOR, ESSENTIAL   . Low back pain   . 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 Anxiety   . Bipolar disorder ([place])   . Osteoarthritis     "probably qwhere/Dr. Percell Miller" (06/28/2015)  . Chronic kidney disease    Past Surgical History  Procedure Laterality Date  . Child birth      x's 2 (27 & 10)  . Anal fissure repair  1990's  . Colonoscopy  2013  . 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 Joint replacement    . Breast biopsy Left ~ 1990   Social History  Substance Use Topics  . Smoking status: Never Smoker   . Smokeless tobacco: Never Used     Comment: Divorced, lives alone and single  . Alcohol Use: Yes     Comment: 06/28/2015 "I quit drinking in the 1990's; never drank hard liquor; nover drank much"   ROS as above Medications: Current Outpatient Prescriptions  Medication Sig Dispense Refill  . b complex vitamins tablet Take 1 tablet by mouth every morning.     . Biotin 5000 MCG TABS Take 1 tablet by mouth 2 (two) times daily.    . Calcium Citrate-Vitamin D (CALCIUM CITRATE + D PO) Take 1 tablet by mouth 2 (two) times daily.    . Cholecalciferol (VITAMIN D3) 1000 UNITS CAPS Take 1,000 Units by mouth 2 (two) times daily.     . clonazePAM (KLONOPIN) 0.5 MG tablet Take 0.25-0.5 mg by mouth 3  (three) times daily as needed for anxiety. Take 1/2 tab twice daily if needed and 1 tab at bedtime as needed 30 tablet   . desvenlafaxine (PRISTIQ) 50 MG 24 hr tablet Take 50 mg by mouth at bedtime.     [name] estradiol (CLIMARA - DOSED IN MG/24 HR) 0.025 mg/24hr patch Place 0.025 mg onto the skin once a week.    . lamoTRIgine (LAMICTAL) 25 MG tablet Take 25 mg by mouth at bedtime.     . Multiple Vitamin (MULTIVITAMIN WITH MINERALS) TABS Take 1 tablet by mouth every morning.    . naproxen (NAPROSYN) 500 MG tablet Take 500 mg by mouth 2 (two) times daily with a meal.    . progesterone (PROMETRIUM) 100 MG capsule Take 100 mg by mouth daily.    . propranolol (INDERAL) 20 MG tablet Take 10 mg by mouth 3 (three) times daily as needed (for anxiety).     [name] doxycycline (VIBRA-TABS) 100 MG tablet Take 1 tablet (100 mg total) by mouth 2 (two) times daily. 14 tablet 0   No current facility-administered medications for this visit.   Allergies  Allergen Reactions  . Amoxicillin Hives    Bumps turn into scabbing  . Penicillins Rash     Exam:  BP 122/72 mmHg  Pulse 69  Temp(Src) 98.5 F (36.9 C) (Oral)  Resp 16  Ht 5' 3.5" (1.613 m)  Wt 154 lb (69.854 kg)  BMI 26.85 kg/m2  SpO2 98% Gen: Well NAD Skin: Erythematous mildly tender nonfluctuant nodule right upper back with no significant surrounding erythema.  No results found for this or any previous visit (from the past 24 hour(s)). No results found.  Assessment and Plan: 70 y.o. female with tick bite with local skin reaction. The tick has been in place long enough to transmitted disease. She does have skin erythema. Treat empirically with doxycycline. Return as needed.  Discussed warning signs or symptoms. Please see discharge instructions. Patient expresses understanding.

## 2016-01-15 ENCOUNTER — Telehealth: Payer: Self-pay

## 2016-01-15 NOTE — Telephone Encounter (Signed)
Left message advising patient to call back to schedule nurse visit for prolia injection---insurance has been verified and estimated copay is $45---can talk with Maribel Luis if any questions

## 2016-01-17 ENCOUNTER — Ambulatory Visit (INDEPENDENT_AMBULATORY_CARE_PROVIDER_SITE_OTHER): Payer: Commercial Managed Care - HMO

## 2016-01-17 DIAGNOSIS — M810 Age-related osteoporosis without current pathological fracture: Secondary | ICD-10-CM | POA: Diagnosis not present

## 2016-01-17 MED ORDER — DENOSUMAB 60 MG/ML ~~LOC~~ SOLN
60.0000 mg | Freq: Once | SUBCUTANEOUS | Status: AC
  Administered 2016-01-17: 60 mg via SUBCUTANEOUS

## 2016-01-31 DIAGNOSIS — M1712 Unilateral primary osteoarthritis, left knee: Secondary | ICD-10-CM | POA: Diagnosis not present

## 2016-01-31 DIAGNOSIS — M25561 Pain in right knee: Secondary | ICD-10-CM | POA: Diagnosis not present

## 2016-02-06 DIAGNOSIS — M1712 Unilateral primary osteoarthritis, left knee: Secondary | ICD-10-CM | POA: Diagnosis not present

## 2016-02-12 ENCOUNTER — Ambulatory Visit: Payer: Commercial Managed Care - HMO | Admitting: Internal Medicine

## 2016-02-13 DIAGNOSIS — M1712 Unilateral primary osteoarthritis, left knee: Secondary | ICD-10-CM | POA: Diagnosis not present

## 2016-02-15 ENCOUNTER — Telehealth: Payer: Self-pay | Admitting: Internal Medicine

## 2016-02-15 ENCOUNTER — Encounter: Payer: Self-pay | Admitting: Internal Medicine

## 2016-02-15 ENCOUNTER — Ambulatory Visit (INDEPENDENT_AMBULATORY_CARE_PROVIDER_SITE_OTHER): Payer: Commercial Managed Care - HMO | Admitting: Internal Medicine

## 2016-02-15 VITALS — BP 98/78 | HR 66 | Temp 98.2°F | Resp 16 | Ht 64.0 in

## 2016-02-15 DIAGNOSIS — L989 Disorder of the skin and subcutaneous tissue, unspecified: Secondary | ICD-10-CM | POA: Diagnosis not present

## 2016-02-15 DIAGNOSIS — F319 Bipolar disorder, unspecified: Secondary | ICD-10-CM

## 2016-02-15 MED ORDER — CLONAZEPAM 0.5 MG PO TABS: 0.2500 mg | ORAL_TABLET | Freq: Three times a day (TID) | ORAL | Status: AC | PRN

## 2016-02-15 MED ORDER — DESVENLAFAXINE SUCCINATE ER 50 MG PO TB24: 50.0000 mg | ORAL_TABLET | Freq: Every day | ORAL | Status: AC

## 2016-02-15 MED ORDER — LAMOTRIGINE 25 MG PO TABS: 25.0000 mg | ORAL_TABLET | Freq: Every day | ORAL | Status: DC

## 2016-02-15 NOTE — Assessment & Plan Note (Signed)
Lesions are not cancerous and with some stigmata of scratching. Talked to her about the need for no itching and can use benadryl cream or hydrocortisone cream for itching. She then denied itching. Not infected appearing. She will make a visit with her dermatologist.

## 2016-02-15 NOTE — Progress Notes (Signed)
Pre visit review using our clinic review tool, if applicable. No additional management support is needed unless otherwise documented below in the visit note. 

## 2016-02-15 NOTE — Patient Instructions (Signed)
We have done the referral and sent in a month's worth of medicine until you can get in.

## 2016-02-15 NOTE — Telephone Encounter (Signed)
Patient came in today inquiring on prolia injection bill.  States she stopped getting prolia injection because of cost.  States she told this to Marne when she called to set back up.  States she was told her copay would only be $45.00.  Patient came in for injection and paid this amount.  Patient states she has gotten a bill for $124.68.  Can you please look into this.  I will bring a copy of the bill back.

## 2016-02-15 NOTE — Telephone Encounter (Signed)
Leah Jensen will be checking with margaret/prolia week of 6/19---i will continually call patient back with updates/info

## 2016-02-15 NOTE — Progress Notes (Signed)
   Subjective:    Patient ID: Leah Jensen, female    DOB: 1946/08/09, 70 y.o.   MRN: [id number]  HPI The patient is a 70 YO female coming in for her mental health. She has been seeing psychiatry who was prescribing her medicine. However they are not taking her insurance and she has been having difficulty finding a doctor to take her insurance. She is almost out of medicine and needs refills until she can get it. Admits to being stable on her current regimen for some time. No flare. Has bipolar 1 disorder per her description. Has several skin lesions as well that she wants me to evaluate.   Review of Systems  Constitutional: Negative for fever, activity change, appetite change and fatigue.  HENT: Negative.   Eyes: Negative.   Respiratory: Negative for cough, chest tightness, shortness of breath and wheezing.   Cardiovascular: Negative for chest pain, palpitations and leg swelling.  Gastrointestinal: Negative.   Musculoskeletal: Negative.   Skin: Negative.   Neurological: Negative.   Psychiatric/Behavioral: Negative.       Objective:   Physical Exam  Constitutional: She is oriented to person, place, and time. She appears well-developed and well-nourished.  HENT:  Head: Normocephalic and atraumatic.  Eyes: EOM are normal.  Neck: No JVD present.  Cardiovascular: Normal rate and regular rhythm.   Pulmonary/Chest: Effort normal and breath sounds normal. No respiratory distress. She has no wheezes. She has no rales.  Abdominal: Soft. She exhibits no distension. There is no tenderness.  Neurological: She is alert and oriented to person, place, and time.  Skin: Skin is warm and dry.  Several skin lesions on her leg and behind her ear. Stigmata of scratching. Do not appear infected or cancerous.    Filed Vitals:   02/15/16 0905  BP: 98/78  Pulse: 66  Temp: 98.2 F (36.8 C)  TempSrc: Oral  Resp: 16  Height: 5\' 4"  (1.626 m)  SpO2: 95%      Assessment & Plan:  Visit time 25  minutes: greater than 50 % of that time was face to face with the patient in counseling and coordination of care about her bipolar disease and diagnosis and course during the visit as well as recent stability and symptoms and meds.

## 2016-02-15 NOTE — Telephone Encounter (Signed)
Pt is receiving a bill for the Prolia given 01/17/16 for $124.68 in coinsurance. Please check with [employer] about this, the eligibility came back with a $45.00 copay.

## 2016-02-15 NOTE — Assessment & Plan Note (Signed)
Referral to psych placed today and 1 month of meds given. Appears per [place] narcotic database that clonazepam given #270 for 3 months last given about 3 months ago. Given rx for #90 today with no refills. Rx as well for her lamictal and pristiq for 1 month with no refills.

## 2016-03-06 ENCOUNTER — Telehealth: Payer: Self-pay | Admitting: Emergency Medicine

## 2016-03-06 DIAGNOSIS — D235 Other benign neoplasm of skin of trunk: Secondary | ICD-10-CM

## 2016-03-06 NOTE — Telephone Encounter (Signed)
Patient called and needs a referral for skin check up pt gave a code of  D22.5. Fax the referral over to Dr Jarvis Newcomer Fax #:[phone]. Thanks.

## 2016-03-07 NOTE — Telephone Encounter (Signed)
Referral placed.

## 2016-03-18 ENCOUNTER — Ambulatory Visit (HOSPITAL_COMMUNITY): Payer: Commercial Managed Care - HMO | Admitting: Psychiatry

## 2016-03-18 DIAGNOSIS — M25561 Pain in right knee: Secondary | ICD-10-CM | POA: Diagnosis not present

## 2016-03-25 ENCOUNTER — Ambulatory Visit (HOSPITAL_COMMUNITY): Payer: Commercial Managed Care - HMO | Admitting: Psychiatry

## 2016-04-02 ENCOUNTER — Encounter (INDEPENDENT_AMBULATORY_CARE_PROVIDER_SITE_OTHER): Payer: Self-pay

## 2016-04-02 ENCOUNTER — Ambulatory Visit (INDEPENDENT_AMBULATORY_CARE_PROVIDER_SITE_OTHER): Payer: Commercial Managed Care - HMO | Admitting: Clinical

## 2016-04-02 DIAGNOSIS — F319 Bipolar disorder, unspecified: Secondary | ICD-10-CM

## 2016-04-02 NOTE — Progress Notes (Signed)
   THERAPIST PROGRESS NOTE  Session Time: 1:30 -2:25  Participation Level: Active  Behavioral Response: CasualAlertDepressed  Type of Therapy: Individual Therapy  Treatment Goals addressed: improve psychiatric symptoms, moderate mood, stress reduction, improve unhelpful thought patterns  Interventions: CBT and Motivational Interviewing grounding and mindfulness techniques  Summary: Leah Jensen is a 70 y.o. female who presents with Bipolar I disorder .   Suicidal/Homicidal: Nowithout intent/plan  Therapist Response: Everlene Farrier) [name] met with clinician for an individual therapy session. She discussed her psychiatric symptoms, her current life events, and her goals for therapy. [name] shared that she was doing okay this week but was looking to manage her symptoms and reduce her stress. 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 Clinician discussed the structure and techniques used in therapy. Clinician introduced some basic cbt concepts. Client and clinician discussed the thought emotion connection. Clinician introduced a homework packet on bipolar which [name] agreed to complete and bring with her to next session. Clinician introduced grounding and mindfulness techniques. Clinician explained the process, purpose and practice of the techniques. Client and clinician practiced some of the techniques together. [name] agreed to practice the techniques daily until next session.  Plan: Return again in 2-3 weeks.  Diagnosis: Axis I: Bipolar I disorder       Sarita Hakanson A, LCSW 04/02/2016

## 2016-04-04 ENCOUNTER — Encounter (HOSPITAL_COMMUNITY): Payer: Self-pay | Admitting: Clinical

## 2016-04-09 DIAGNOSIS — L928 Other granulomatous disorders of the skin and subcutaneous tissue: Secondary | ICD-10-CM | POA: Diagnosis not present

## 2016-04-09 DIAGNOSIS — Z1283 Encounter for screening for malignant neoplasm of skin: Secondary | ICD-10-CM | POA: Diagnosis not present

## 2016-04-24 ENCOUNTER — Ambulatory Visit (HOSPITAL_COMMUNITY): Payer: Self-pay | Admitting: Clinical

## 2016-04-28 DIAGNOSIS — H2513 Age-related nuclear cataract, bilateral: Secondary | ICD-10-CM | POA: Diagnosis not present

## 2016-04-30 DIAGNOSIS — F329 Major depressive disorder, single episode, unspecified: Secondary | ICD-10-CM | POA: Diagnosis not present

## 2016-05-22 ENCOUNTER — Ambulatory Visit (HOSPITAL_COMMUNITY): Payer: Self-pay | Admitting: Psychiatry

## 2016-06-03 ENCOUNTER — Other Ambulatory Visit (HOSPITAL_COMMUNITY): Payer: Self-pay | Admitting: Orthopedic Surgery

## 2016-06-03 DIAGNOSIS — M25561 Pain in right knee: Secondary | ICD-10-CM | POA: Diagnosis not present

## 2016-06-10 ENCOUNTER — Other Ambulatory Visit: Payer: Self-pay | Admitting: Internal Medicine

## 2016-06-10 ENCOUNTER — Encounter (HOSPITAL_COMMUNITY): Payer: Commercial Managed Care - HMO

## 2016-06-10 ENCOUNTER — Encounter (HOSPITAL_COMMUNITY)
Admission: RE | Admit: 2016-06-10 | Discharge: 2016-06-10 | Disposition: A | Discharge status: Home / Self Care | Payer: Commercial Managed Care - HMO | Source: Ambulatory Visit | Attending: Orthopedic Surgery | Admitting: Orthopedic Surgery

## 2016-06-10 DIAGNOSIS — M79604 Pain in right leg: Secondary | ICD-10-CM | POA: Insufficient documentation

## 2016-06-10 DIAGNOSIS — Z96651 Presence of right artificial knee joint: Secondary | ICD-10-CM | POA: Insufficient documentation

## 2016-06-10 DIAGNOSIS — R948 Abnormal results of function studies of other organs and systems: Secondary | ICD-10-CM | POA: Diagnosis not present

## 2016-06-10 DIAGNOSIS — M25561 Pain in right knee: Secondary | ICD-10-CM

## 2016-06-10 DIAGNOSIS — Z1231 Encounter for screening mammogram for malignant neoplasm of breast: Secondary | ICD-10-CM

## 2016-06-10 MED ORDER — TECHNETIUM TC 99M MEDRONATE IV KIT
25.0000 | PACK | Freq: Once | INTRAVENOUS | Status: AC | PRN
  Administered 2016-06-10: 25 via INTRAVENOUS

## 2016-06-17 DIAGNOSIS — M25561 Pain in right knee: Secondary | ICD-10-CM | POA: Diagnosis not present

## 2016-06-30 ENCOUNTER — Telehealth: Payer: Self-pay

## 2016-06-30 NOTE — Telephone Encounter (Signed)
Patients next prolia injection is due either on/after 07/20/16---I am verifying insurance benefits and will call patient with findings---can talk with tamara if any questions

## 2016-07-04 ENCOUNTER — Telehealth: Payer: Self-pay

## 2016-07-04 NOTE — Telephone Encounter (Signed)
Prolia pre-certification request form completed and placed on MD's desk for signature

## 2016-07-07 NOTE — Telephone Encounter (Signed)
Paperwork signed and faxed back to [employer] with clinical notes.  Forms placed on RN TL's desk for records

## 2016-07-15 ENCOUNTER — Telehealth: Payer: Self-pay

## 2016-07-15 NOTE — Telephone Encounter (Signed)
Patient advised that insurance has been verified for prolia injection, $230 copay, patient has scheduled 11/28 for nurse visit

## 2016-07-16 ENCOUNTER — Ambulatory Visit
Admission: RE | Admit: 2016-07-16 | Discharge: 2016-07-16 | Disposition: A | Discharge status: Home / Self Care | Payer: Commercial Managed Care - HMO | Source: Ambulatory Visit | Attending: Internal Medicine | Admitting: Internal Medicine

## 2016-07-16 DIAGNOSIS — Z1231 Encounter for screening mammogram for malignant neoplasm of breast: Secondary | ICD-10-CM | POA: Diagnosis not present

## 2016-07-29 ENCOUNTER — Ambulatory Visit (INDEPENDENT_AMBULATORY_CARE_PROVIDER_SITE_OTHER): Payer: Commercial Managed Care - HMO | Admitting: General Practice

## 2016-07-29 DIAGNOSIS — Z5181 Encounter for therapeutic drug level monitoring: Secondary | ICD-10-CM | POA: Diagnosis not present

## 2016-07-29 DIAGNOSIS — S82101K Unspecified fracture of upper end of right tibia, subsequent encounter for closed fracture with nonunion: Secondary | ICD-10-CM | POA: Diagnosis not present

## 2016-07-29 DIAGNOSIS — Z79899 Other long term (current) drug therapy: Secondary | ICD-10-CM | POA: Diagnosis not present

## 2016-07-29 MED ORDER — DENOSUMAB 60 MG/ML ~~LOC~~ SOLN
60.0000 mg | Freq: Once | SUBCUTANEOUS | Status: AC
  Administered 2016-07-29: 60 mg via SUBCUTANEOUS

## 2016-07-29 NOTE — Progress Notes (Signed)
Medical treatment/procedure(s) were performed by non-physician practitioner and as supervising physician I was immediately available for consultation/collaboration. I agree with above. Nas Wafer A Alnita Aybar, MD  

## 2016-08-08 ENCOUNTER — Ambulatory Visit: Payer: Self-pay | Admitting: Internal Medicine

## 2016-08-28 IMAGING — CR DG KNEE 1-2V PORT*R*
2 series · 2 of 2 positions shown · non-contrast
Comparison: None.

CLINICAL DATA: Status post right knee replacement

EXAM:
PORTABLE RIGHT KNEE - 1-2 VIEW

[AP]
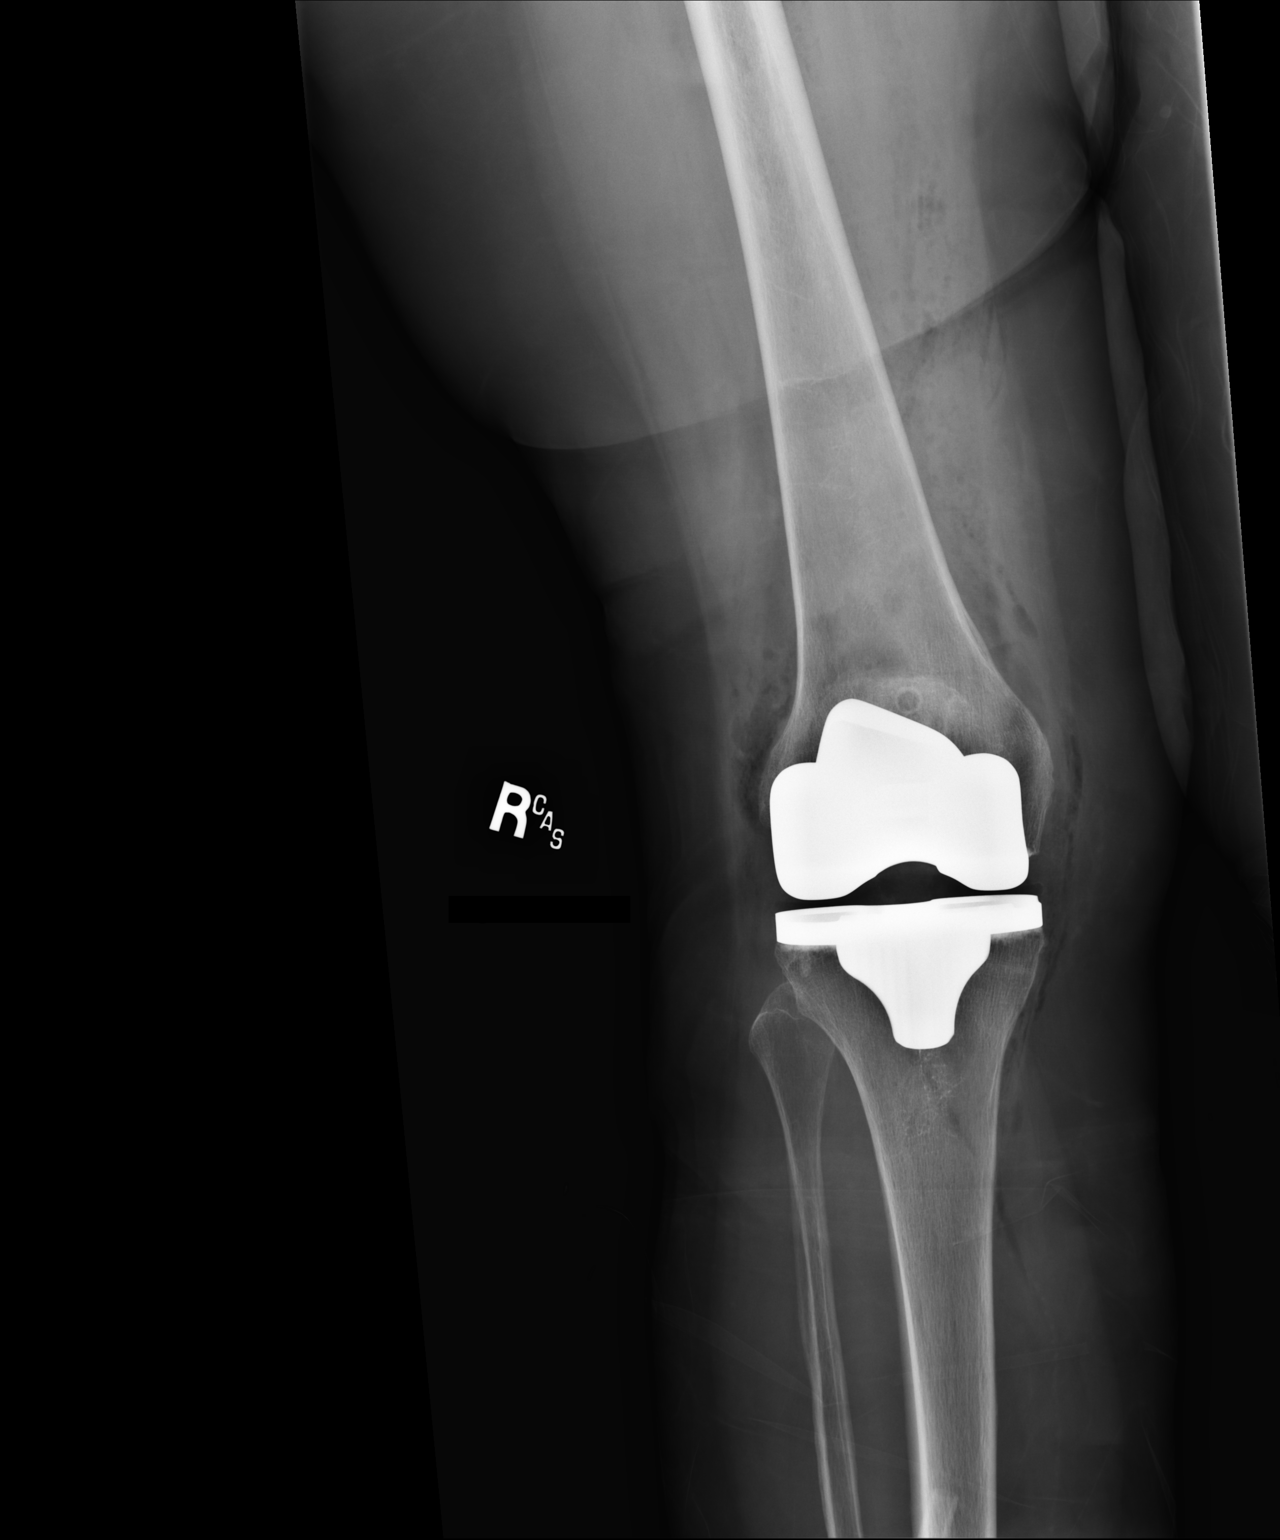

[xtable lateral]
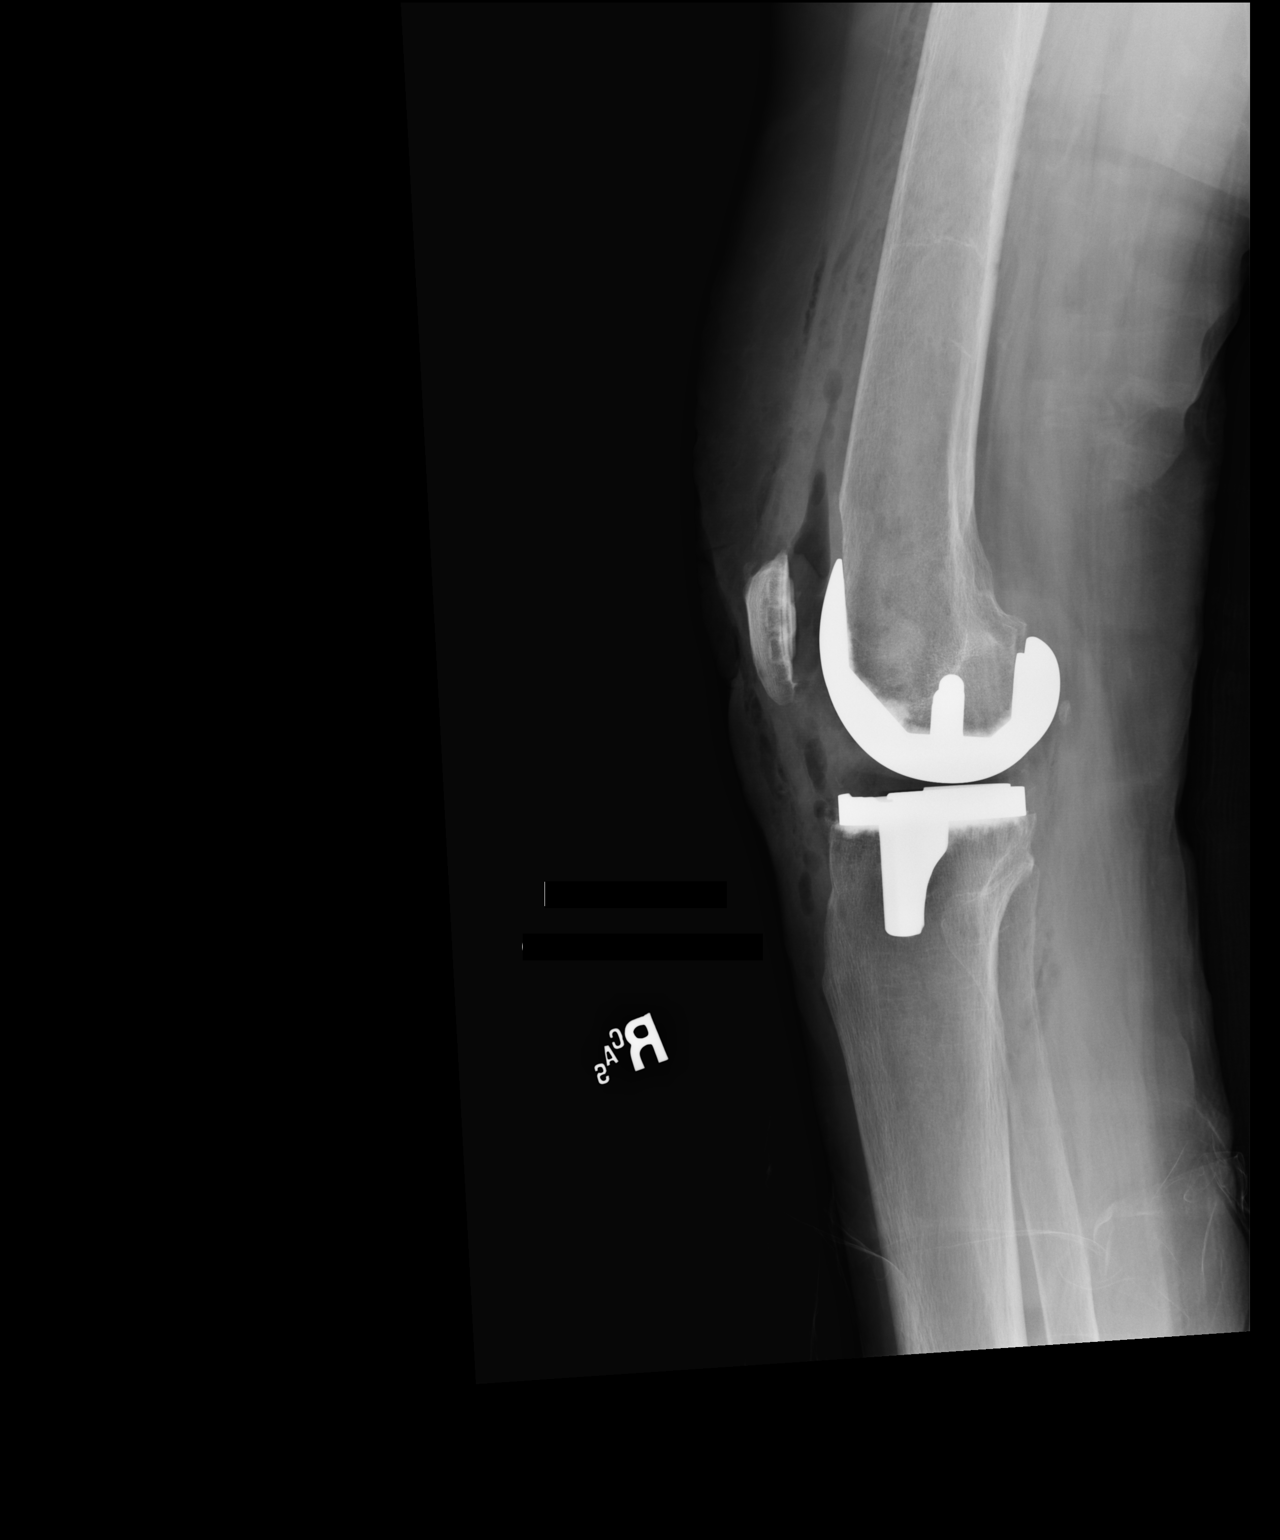

[2 of 2 positions shown; findings below may reference images not displayed]

FINDINGS: A right knee prosthesis is now seen. Air is noted in the surgical
bed. No acute bony abnormality is seen.
IMPRESSION: Status post right knee replacement

## 2016-09-09 DIAGNOSIS — M25561 Pain in right knee: Secondary | ICD-10-CM | POA: Diagnosis not present

## 2016-09-23 DIAGNOSIS — Z96651 Presence of right artificial knee joint: Secondary | ICD-10-CM | POA: Diagnosis not present

## 2016-10-04 ENCOUNTER — Encounter: Payer: Self-pay | Admitting: Student

## 2016-10-21 DIAGNOSIS — M25562 Pain in left knee: Secondary | ICD-10-CM | POA: Diagnosis not present

## 2016-11-06 DIAGNOSIS — L292 Pruritus vulvae: Secondary | ICD-10-CM | POA: Diagnosis not present

## 2016-11-11 ENCOUNTER — Other Ambulatory Visit (HOSPITAL_COMMUNITY): Payer: Self-pay | Admitting: Orthopedic Surgery

## 2016-11-11 DIAGNOSIS — T84032A Mechanical loosening of internal right knee prosthetic joint, initial encounter: Secondary | ICD-10-CM

## 2016-11-12 DIAGNOSIS — F330 Major depressive disorder, recurrent, mild: Secondary | ICD-10-CM | POA: Diagnosis not present

## 2016-11-20 ENCOUNTER — Encounter (HOSPITAL_COMMUNITY)
Admission: RE | Admit: 2016-11-20 | Discharge: 2016-11-20 | Disposition: A | Discharge status: Home / Self Care | Payer: Medicare HMO | Source: Ambulatory Visit | Attending: Orthopedic Surgery | Admitting: Orthopedic Surgery

## 2016-11-20 ENCOUNTER — Encounter (HOSPITAL_COMMUNITY): Payer: Commercial Managed Care - HMO

## 2016-11-20 DIAGNOSIS — T84032A Mechanical loosening of internal right knee prosthetic joint, initial encounter: Secondary | ICD-10-CM

## 2016-11-20 DIAGNOSIS — M79604 Pain in right leg: Secondary | ICD-10-CM | POA: Diagnosis not present

## 2016-11-20 DIAGNOSIS — Z96651 Presence of right artificial knee joint: Secondary | ICD-10-CM | POA: Insufficient documentation

## 2016-11-20 DIAGNOSIS — M25561 Pain in right knee: Secondary | ICD-10-CM | POA: Diagnosis not present

## 2016-11-20 MED ORDER — TECHNETIUM TC 99M MEDRONATE IV KIT
25.0000 | PACK | Freq: Once | INTRAVENOUS | Status: AC | PRN
  Administered 2016-11-20: 25 via INTRAVENOUS

## 2016-11-26 ENCOUNTER — Encounter: Payer: Self-pay | Admitting: Gastroenterology

## 2016-11-27 ENCOUNTER — Encounter: Payer: Self-pay | Admitting: Internal Medicine

## 2016-11-27 ENCOUNTER — Ambulatory Visit (INDEPENDENT_AMBULATORY_CARE_PROVIDER_SITE_OTHER): Payer: Medicare HMO | Admitting: Internal Medicine

## 2016-11-27 ENCOUNTER — Other Ambulatory Visit (INDEPENDENT_AMBULATORY_CARE_PROVIDER_SITE_OTHER): Payer: Medicare HMO

## 2016-11-27 VITALS — BP 100/70 | HR 66 | Resp 14 | Ht 64.0 in | Wt 149.0 lb

## 2016-11-27 DIAGNOSIS — Z0000 Encounter for general adult medical examination without abnormal findings: Secondary | ICD-10-CM | POA: Diagnosis not present

## 2016-11-27 LAB — COMPREHENSIVE METABOLIC PANEL
ALBUMIN: 3.8 g/dL (ref 3.5–5.2)
ALT: 11 U/L (ref 0–35)
AST: 15 U/L (ref 0–37)
Alkaline Phosphatase: 33 U/L — ABNORMAL LOW (ref 39–117)
BUN: 13 mg/dL (ref 6–23)
CALCIUM: 9.2 mg/dL (ref 8.4–10.5)
CHLORIDE: 105 meq/L (ref 96–112)
CO2: 26 mEq/L (ref 19–32)
Creatinine, Ser: 0.64 mg/dL (ref 0.40–1.20)
GFR: 97.12 mL/min (ref >60.00)
Glucose, Bld: 88 mg/dL (ref 70–99)
Potassium: 4.1 mEq/L (ref 3.5–5.1)
Sodium: 137 mEq/L (ref 135–145)
Total Bilirubin: 0.4 mg/dL (ref 0.2–1.2)
Total Protein: 6.5 g/dL (ref 6.0–8.3)

## 2016-11-27 LAB — LIPID PANEL
CHOLESTEROL: 212 mg/dL — AB (ref 0–200)
HDL: 56.6 mg/dL (ref >39.00)
LDL CALC: 130 mg/dL — AB (ref 0–99)
NonHDL: 155.27
Total CHOL/HDL Ratio: 4
Triglycerides: 128 mg/dL (ref 0.0–149.0)
VLDL: 25.6 mg/dL (ref 0.0–40.0)

## 2016-11-27 LAB — CBC
HEMATOCRIT: 41.2 % (ref 36.0–46.0)
Hemoglobin: 13.6 g/dL (ref 12.0–15.0)
MCHC: 33.1 g/dL (ref 30.0–36.0)
MCV: 90.8 fl (ref 78.0–100.0)
Platelets: 263 10*3/uL (ref 150.0–400.0)
RBC: 4.54 Mil/uL (ref 3.87–5.11)
RDW: 14.7 % (ref 11.5–15.5)
WBC: 4.1 10*3/uL (ref 4.0–10.5)

## 2016-11-27 LAB — HEMOGLOBIN A1C: Hgb A1c MFr Bld: 5.5 % (ref 4.6–6.5)

## 2016-11-27 NOTE — Assessment & Plan Note (Signed)
Reminded due for colonoscopy this year. She will contact GI and if needs referral will call us. Tetanus up to date and pneumonia up to date. Declines flu. Checking labs. Counseled about sun safety and mole surveillance. Given 10 year screening recommendations. Mammogram and dexa up to date.

## 2016-11-27 NOTE — Progress Notes (Signed)
   Subjective:    Patient ID: Leah Jensen, female    DOB: 18-Mar-1946, 71 y.o.   MRN: [id number]  HPI Here for medicare wellness and physical, no new complaints. Please see A/P for status and treatment of chronic medical problems.   Diet: heart healthy Physical activity: active Depression/mood screen: negative Hearing: intact to whispered voice Visual acuity: grossly normal with reading lens, performs annual eye exam  ADLs: capable Fall risk: none Home safety: good Cognitive evaluation: intact to orientation, naming, recall and repetition EOL planning: adv directives discussed  I have personally reviewed and have noted 1. The patient's medical and social history - reviewed today no changes 2. Their use of alcohol, tobacco or illicit drugs 3. Their current medications and supplements 4. The patient's functional ability including ADL's, fall risks, home safety risks and hearing or visual impairment. 5. Diet and physical activities 6. Evidence for depression or mood disorders 7. Care team reviewed and updated (available in snapshot)  Review of Systems  Constitutional: Negative.   HENT: Negative.   Eyes: Negative.   Respiratory: Negative for cough, chest tightness and shortness of breath.   Cardiovascular: Negative for chest pain, palpitations and leg swelling.  Gastrointestinal: Negative for abdominal distention, abdominal pain, constipation, diarrhea, nausea and vomiting.  Musculoskeletal: Negative.   Skin: Negative.   Neurological: Negative.   Psychiatric/Behavioral: Negative.       Objective:   Physical Exam  Constitutional: She is oriented to person, place, and time. She appears well-developed and well-nourished.  HENT:  Head: Normocephalic and atraumatic.  Eyes: EOM are normal.  Neck: Normal range of motion.  Cardiovascular: Normal rate and regular rhythm.   Pulmonary/Chest: Effort normal and breath sounds normal. No respiratory distress. She has no wheezes. She  has no rales.  Abdominal: Soft. Bowel sounds are normal. She exhibits no distension. There is no tenderness. There is no rebound.  Musculoskeletal: She exhibits no edema.  Neurological: She is alert and oriented to person, place, and time. Coordination normal.  Skin: Skin is warm and dry.  Psychiatric: She has a normal mood and affect.   Vitals:   11/27/16 1451  BP: 100/70  Pulse: 66  Resp: 14  SpO2: 97%  Weight: 149 lb (67.6 kg)  Height: 5\' 4"  (1.626 m)      Assessment & Plan:

## 2016-11-27 NOTE — Progress Notes (Signed)
Pre visit review using our clinic review tool, if applicable. No additional management support is needed unless otherwise documented below in the visit note. 

## 2016-11-27 NOTE — Patient Instructions (Signed)
Your colon cancer screening is due this year in May or June.  Health Maintenance, Female Adopting a healthy lifestyle and getting preventive care can go a long way to promote health and wellness. Talk with your health care provider about what schedule of regular examinations is right for you. This is a good chance for you to check in with your provider about disease prevention and staying healthy. In between checkups, there are plenty of things you can do on your own. Experts have done a lot of research about which lifestyle changes and preventive measures are most likely to keep you healthy. Ask your health care provider for more information. Weight and diet Eat a healthy diet  Be sure to include plenty of vegetables, fruits, low-fat dairy products, and lean protein.  Do not eat a lot of foods high in solid fats, added sugars, or salt.  Get regular exercise. This is one of the most important things you can do for your health.  Most adults should exercise for at least 150 minutes each week. The exercise should increase your heart rate and make you sweat (moderate-intensity exercise).  Most adults should also do strengthening exercises at least twice a week. This is in addition to the moderate-intensity exercise. Maintain a healthy weight  Body mass index (BMI) is a measurement that can be used to identify possible weight problems. It estimates body fat based on height and weight. Your health care provider can help determine your BMI and help you achieve or maintain a healthy weight.  For females 59 years of age and older:  A BMI below 18.5 is considered underweight.  A BMI of 18.5 to 24.9 is normal.  A BMI of 25 to 29.9 is considered overweight.  A BMI of 30 and above is considered obese. Watch levels of cholesterol and blood lipids  You should start having your blood tested for lipids and cholesterol at 72 years of age, then have this test every 5 years.  You may need to have your  cholesterol levels checked more often if:  Your lipid or cholesterol levels are high.  You are older than 72 years of age.  You are at high risk for heart disease. Cancer screening Lung Cancer  Lung cancer screening is recommended for adults 57-42 years old who are at high risk for lung cancer because of a history of smoking.  A yearly low-dose CT scan of the lungs is recommended for people who:  Currently smoke.  Have quit within the past 15 years.  Have at least a 30-pack-year history of smoking. A pack year is smoking an average of one pack of cigarettes a day for 1 year.  Yearly screening should continue until it has been 15 years since you quit.  Yearly screening should stop if you develop a health problem that would prevent you from having lung cancer treatment. Breast Cancer  Practice breast self-awareness. This means understanding how your breasts normally appear and feel.  It also means doing regular breast self-exams. Let your health care provider know about any changes, no matter how small.  If you are in your 20s or 30s, you should have a clinical breast exam (CBE) by a health care provider every 1-3 years as part of a regular health exam.  If you are 54 or older, have a CBE every year. Also consider having a breast X-ray (mammogram) every year.  If you have a family history of breast cancer, talk to your health care provider about  genetic screening.  If you are at high risk for breast cancer, talk to your health care provider about having an MRI and a mammogram every year.  Breast cancer gene (BRCA) assessment is recommended for women who have family members with BRCA-related cancers. BRCA-related cancers include:  Breast.  Ovarian.  Tubal.  Peritoneal cancers.  Results of the assessment will determine the need for genetic counseling and BRCA1 and BRCA2 testing. Cervical Cancer  Your health care provider may recommend that you be screened regularly for  cancer of the pelvic organs (ovaries, uterus, and vagina). This screening involves a pelvic examination, including checking for microscopic changes to the surface of your cervix (Pap test). You may be encouraged to have this screening done every 3 years, beginning at age 86.  For women ages 20-65, health care providers may recommend pelvic exams and Pap testing every 3 years, or they may recommend the Pap and pelvic exam, combined with testing for human papilloma virus (HPV), every 5 years. Some types of HPV increase your risk of cervical cancer. Testing for HPV may also be done on women of any age with unclear Pap test results.  Other health care providers may not recommend any screening for nonpregnant women who are considered low risk for pelvic cancer and who do not have symptoms. Ask your health care provider if a screening pelvic exam is right for you.  If you have had past treatment for cervical cancer or a condition that could lead to cancer, you need Pap tests and screening for cancer for at least 20 years after your treatment. If Pap tests have been discontinued, your risk factors (such as having a new sexual partner) need to be reassessed to determine if screening should resume. Some women have medical problems that increase the chance of getting cervical cancer. In these cases, your health care provider may recommend more frequent screening and Pap tests. Colorectal Cancer  This type of cancer can be detected and often prevented.  Routine colorectal cancer screening usually begins at 72 years of age and continues through 72 years of age.  Your health care provider may recommend screening at an earlier age if you have risk factors for colon cancer.  Your health care provider may also recommend using home test kits to check for hidden blood in the stool.  A small camera at the end of a tube can be used to examine your colon directly (sigmoidoscopy or colonoscopy). This is done to check for  the earliest forms of colorectal cancer.  Routine screening usually begins at age 27.  Direct examination of the colon should be repeated every 5-10 years through 72 years of age. However, you may need to be screened more often if early forms of precancerous polyps or small growths are found. Skin Cancer  Check your skin from head to toe regularly.  Tell your health care provider about any new moles or changes in moles, especially if there is a change in a mole's shape or color.  Also tell your health care provider if you have a mole that is larger than the size of a pencil eraser.  Always use sunscreen. Apply sunscreen liberally and repeatedly throughout the day.  Protect yourself by wearing long sleeves, pants, a wide-brimmed hat, and sunglasses whenever you are outside. Heart disease, diabetes, and high blood pressure  High blood pressure causes heart disease and increases the risk of stroke. High blood pressure is more likely to develop in:  People who have blood  pressure in the high end of the normal range (130-139/85-89 mm Hg).  People who are overweight or obese.  People who are African American.  If you are 43-36 years of age, have your blood pressure checked every 3-5 years. If you are 26 years of age or older, have your blood pressure checked every year. You should have your blood pressure measured twice-once when you are at a hospital or clinic, and once when you are not at a hospital or clinic. Record the average of the two measurements. To check your blood pressure when you are not at a hospital or clinic, you can use:  An automated blood pressure machine at a pharmacy.  A home blood pressure monitor.  If you are between 25 years and 59 years old, ask your health care provider if you should take aspirin to prevent strokes.  Have regular diabetes screenings. This involves taking a blood sample to check your fasting blood sugar level.  If you are at a normal weight and  have a low risk for diabetes, have this test once every three years after 72 years of age.  If you are overweight and have a high risk for diabetes, consider being tested at a younger age or more often. Preventing infection Hepatitis B  If you have a higher risk for hepatitis B, you should be screened for this virus. You are considered at high risk for hepatitis B if:  You were born in a country where hepatitis B is common. Ask your health care provider which countries are considered high risk.  Your parents were born in a high-risk country, and you have not been immunized against hepatitis B (hepatitis B vaccine).  You have HIV or AIDS.  You use needles to inject street drugs.  You live with someone who has hepatitis B.  You have had sex with someone who has hepatitis B.  You get hemodialysis treatment.  You take certain medicines for conditions, including cancer, organ transplantation, and autoimmune conditions. Hepatitis C  Blood testing is recommended for:  Everyone born from [birth year] through 1966.  Anyone with known risk factors for hepatitis C. Sexually transmitted infections (STIs)  You should be screened for sexually transmitted infections (STIs) including gonorrhea and chlamydia if:  You are sexually active and are younger than 72 years of age.  You are older than 72 years of age and your health care provider tells you that you are at risk for this type of infection.  Your sexual activity has changed since you were last screened and you are at an increased risk for chlamydia or gonorrhea. Ask your health care provider if you are at risk.  If you do not have HIV, but are at risk, it may be recommended that you take a prescription medicine daily to prevent HIV infection. This is called pre-exposure prophylaxis (PrEP). You are considered at risk if:  You are sexually active and do not regularly use condoms or know the HIV status of your partner(s).  You take drugs by  injection.  You are sexually active with a partner who has HIV. Talk with your health care provider about whether you are at high risk of being infected with HIV. If you choose to begin PrEP, you should first be tested for HIV. You should then be tested every 3 months for as long as you are taking PrEP. Pregnancy  If you are premenopausal and you may become pregnant, ask your health care provider about preconception counseling.  If  you may become pregnant, take 400 to 800 micrograms (mcg) of folic acid every day.  If you want to prevent pregnancy, talk to your health care provider about birth control (contraception). Osteoporosis and menopause  Osteoporosis is a disease in which the bones lose minerals and strength with aging. This can result in serious bone fractures. Your risk for osteoporosis can be identified using a bone density scan.  If you are 10 years of age or older, or if you are at risk for osteoporosis and fractures, ask your health care provider if you should be screened.  Ask your health care provider whether you should take a calcium or vitamin D supplement to lower your risk for osteoporosis.  Menopause may have certain physical symptoms and risks.  Hormone replacement therapy may reduce some of these symptoms and risks. Talk to your health care provider about whether hormone replacement therapy is right for you. Follow these instructions at home:  Schedule regular health, dental, and eye exams.  Stay current with your immunizations.  Do not use any tobacco products including cigarettes, chewing tobacco, or electronic cigarettes.  If you are pregnant, do not drink alcohol.  If you are breastfeeding, limit how much and how often you drink alcohol.  Limit alcohol intake to no more than 1 drink per day for nonpregnant women. One drink equals 12 ounces of beer, 5 ounces of wine, or 1 ounces of hard liquor.  Do not use street drugs.  Do not share needles.  Ask  your health care provider for help if you need support or information about quitting drugs.  Tell your health care provider if you often feel depressed.  Tell your health care provider if you have ever been abused or do not feel safe at home. This information is not intended to replace advice given to you by your health care provider. Make sure you discuss any questions you have with your health care provider. Document Released: 03/03/2011 Document Revised: 01/24/2016 Document Reviewed: 05/22/2015 Elsevier Interactive Patient Education  2017 [employer].

## 2016-12-09 DIAGNOSIS — M25561 Pain in right knee: Secondary | ICD-10-CM | POA: Diagnosis not present

## 2017-01-27 ENCOUNTER — Ambulatory Visit: Payer: Self-pay | Admitting: Internal Medicine

## 2017-02-03 ENCOUNTER — Encounter: Payer: Self-pay | Admitting: Gastroenterology

## 2017-02-03 ENCOUNTER — Ambulatory Visit (INDEPENDENT_AMBULATORY_CARE_PROVIDER_SITE_OTHER): Payer: Medicare HMO | Admitting: Internal Medicine

## 2017-02-03 ENCOUNTER — Encounter: Payer: Self-pay | Admitting: Internal Medicine

## 2017-02-03 DIAGNOSIS — J3489 Other specified disorders of nose and nasal sinuses: Secondary | ICD-10-CM

## 2017-02-03 MED ORDER — MUPIROCIN 2 % EX OINT: 1.0000 "application " | TOPICAL_OINTMENT | Freq: Three times a day (TID) | CUTANEOUS | 0 refills | Status: DC

## 2017-02-03 NOTE — Progress Notes (Signed)
   Subjective:    Patient ID: Leah Jensen, female    DOB: February 23, 1946, 71 y.o.   MRN: [id number]  HPI The patient is a 71 YO female coming in for sore on the inside of her nose. Started some time ago (months) and has been about the same since that time. She has tried to use some ointment on it but due to the chronic dripping she does not feel that it stays because she is wiping her nose constantly and it does not stay. She is having some mild soreness around the sore. There is another one in the right nostril but that one is healing.   Review of Systems  Constitutional: Negative for activity change, appetite change, fatigue, fever and unexpected weight change.  HENT: Positive for congestion, postnasal drip and rhinorrhea. Negative for sinus pain, sinus pressure, sore throat, tinnitus, trouble swallowing and voice change.        Ulcer in nostril  Eyes: Negative.   Respiratory: Negative.   Cardiovascular: Negative.   Gastrointestinal: Negative.       Objective:   Physical Exam  Constitutional: She appears well-developed and well-nourished.  HENT:  Right Ear: External ear normal.  Left Ear: External ear normal.  Mouth/Throat: Oropharynx is clear and moist.  Nose with punctate ulcer left nostril superior, right nostril with spot of redness and irritation without ulceration  Eyes: EOM are normal. Pupils are equal, round, and reactive to light.  Neck: Normal range of motion. No JVD present.  Cardiovascular: Normal rate.   Pulmonary/Chest: Effort normal and breath sounds normal.  Abdominal: Soft.  Lymphadenopathy:    She has no cervical adenopathy.   Vitals:   02/03/17 1458  BP: 120/70  Pulse: (!) 57  Resp: 12  Temp: 97.7 F (36.5 C)  TempSrc: Oral  SpO2: 98%  Weight: 139 lb (63 kg)  Height: 5\' 4"  (1.626 m)      Assessment & Plan:

## 2017-02-03 NOTE — Patient Instructions (Signed)
We have sent in the bactroban ointment to use on the nose three times per day.

## 2017-02-04 DIAGNOSIS — J3489 Other specified disorders of nose and nasal sinuses: Secondary | ICD-10-CM | POA: Insufficient documentation

## 2017-02-04 NOTE — Assessment & Plan Note (Addendum)
Rx for bactroban ointment and advised not to wipe constantly and use a dab on the outside only with dripping. She does not want to see ENT at this time due to bad experience in the past. She also does not see the point of taking allergy medication although we advise zyrtec daily until this is healed.

## 2017-02-06 ENCOUNTER — Telehealth: Payer: Self-pay | Admitting: Internal Medicine

## 2017-02-06 NOTE — Telephone Encounter (Signed)
Pt would like to receive her next Prolia, last one was 07/2016 ,

## 2017-02-09 NOTE — Telephone Encounter (Signed)
Patient to contact insurance co to find out EOB and call tamara back

## 2017-02-09 NOTE — Telephone Encounter (Signed)
Left message asking patient to call Abass Misener back---insurance is not wanting to disclose any benefits information on prolia to our office, we are 3rd party---patient needs to discuss other options for prolia, can talk with Strummer Canipe when she calls back

## 2017-02-12 NOTE — Telephone Encounter (Signed)
Pt called and her insurance states they could not find anything to where she needs approval for her Prolia. Her insurance is faxing over EOB and what her copay would be. She states the lady she talked to did something and lowered her Copay.   Prefers call back on Mobile number

## 2017-03-03 DIAGNOSIS — H2513 Age-related nuclear cataract, bilateral: Secondary | ICD-10-CM | POA: Diagnosis not present

## 2017-04-03 ENCOUNTER — Telehealth: Payer: Self-pay

## 2017-04-03 NOTE — Telephone Encounter (Signed)
Left message reminding patient that I cannot get any information from her insurance company about her benefits for prolia---she is due another prolia injection now, last injection was 07/29/16---her insurance requires that she call and get plan benefits and then call our office back with that information---can talk with tamara if any questions

## 2017-04-03 NOTE — Telephone Encounter (Signed)
Patient would like for Jonelle Sidle to give her a call back.

## 2017-04-03 NOTE — Telephone Encounter (Signed)
Left message asking patient to call me back

## 2017-04-09 ENCOUNTER — Ambulatory Visit (AMBULATORY_SURGERY_CENTER): Payer: Self-pay | Admitting: *Deleted

## 2017-04-09 ENCOUNTER — Encounter: Payer: Self-pay | Admitting: Gastroenterology

## 2017-04-09 VITALS — Ht 63.0 in | Wt 132.6 lb

## 2017-04-09 DIAGNOSIS — Z86010 Personal history of colonic polyps: Secondary | ICD-10-CM

## 2017-04-09 MED ORDER — NA SULFATE-K SULFATE-MG SULF 17.5-3.13-1.6 GM/177ML PO SOLN: 1.0000 [IU] | Freq: Once | ORAL | 0 refills | Status: AC

## 2017-04-09 NOTE — Progress Notes (Signed)
No egg or soy allergy known to patient  No issues with past sedation with any surgeries  or procedures, no intubation problems  No diet pills per patient No home 02 use per patient  No blood thinners per patient  Pt denies issues with constipation  No A fib or A flutter  EMMI video sent to pt's e mail  

## 2017-04-13 ENCOUNTER — Encounter: Payer: Self-pay | Admitting: Internal Medicine

## 2017-04-14 NOTE — Telephone Encounter (Signed)
Pt called asking to speak with you. She can be reached at [phone].

## 2017-04-15 DIAGNOSIS — L308 Other specified dermatitis: Secondary | ICD-10-CM | POA: Diagnosis not present

## 2017-04-15 DIAGNOSIS — L821 Other seborrheic keratosis: Secondary | ICD-10-CM | POA: Diagnosis not present

## 2017-04-15 DIAGNOSIS — Z1283 Encounter for screening for malignant neoplasm of skin: Secondary | ICD-10-CM | POA: Diagnosis not present

## 2017-04-15 DIAGNOSIS — L218 Other seborrheic dermatitis: Secondary | ICD-10-CM | POA: Diagnosis not present

## 2017-04-16 NOTE — Telephone Encounter (Signed)
Patient has called insurance co and they deny saying anything about not releasing information to 3rd party/our office----patient would like for [place] to [employer] again and see if we can get benefits this time----tamara will be reverifying insurance and call patient back

## 2017-04-21 DIAGNOSIS — M25561 Pain in right knee: Secondary | ICD-10-CM | POA: Diagnosis not present

## 2017-04-23 ENCOUNTER — Encounter: Payer: Self-pay | Admitting: Gastroenterology

## 2017-04-23 ENCOUNTER — Ambulatory Visit (AMBULATORY_SURGERY_CENTER): Payer: Medicare HMO | Admitting: Gastroenterology

## 2017-04-23 VITALS — BP 93/50 | HR 61 | Temp 97.7°F | Resp 11 | Ht 63.0 in | Wt 132.0 lb

## 2017-04-23 DIAGNOSIS — D124 Benign neoplasm of descending colon: Secondary | ICD-10-CM

## 2017-04-23 DIAGNOSIS — F329 Major depressive disorder, single episode, unspecified: Secondary | ICD-10-CM | POA: Diagnosis not present

## 2017-04-23 DIAGNOSIS — Z86010 Personal history of colonic polyps: Secondary | ICD-10-CM

## 2017-04-23 DIAGNOSIS — F319 Bipolar disorder, unspecified: Secondary | ICD-10-CM | POA: Diagnosis not present

## 2017-04-23 DIAGNOSIS — I10 Essential (primary) hypertension: Secondary | ICD-10-CM | POA: Diagnosis not present

## 2017-04-23 MED ORDER — SODIUM CHLORIDE 0.9 % IV SOLN: 500.0000 mL | INTRAVENOUS | Status: DC

## 2017-04-23 NOTE — Progress Notes (Signed)
Report to PACU, RN, vss, BBS= Clear.  

## 2017-04-23 NOTE — Progress Notes (Signed)
Pt's states no medical or surgical changes since previsit or office visit. 

## 2017-04-23 NOTE — Progress Notes (Signed)
Called to room to assist during endoscopic procedure.  Patient ID and intended procedure confirmed with present staff. Received instructions for my participation in the procedure from the performing physician.  

## 2017-04-23 NOTE — Patient Instructions (Signed)
YOU HAD AN ENDOSCOPIC PROCEDURE TODAY AT THE [facility]:   Refer to the procedure report that was given to you for any specific questions about what was found during the examination.  If the procedure report does not answer your questions, please call your gastroenterologist to clarify.  If you requested that your care partner not be given the details of your procedure findings, then the procedure report has been included in a sealed envelope for you to review at your convenience later.  YOU SHOULD EXPECT: Some feelings of bloating in the abdomen. Passage of more gas than usual.  Walking can help get rid of the air that was put into your GI tract during the procedure and reduce the bloating. If you had a lower endoscopy (such as a colonoscopy or flexible sigmoidoscopy) you may notice spotting of blood in your stool or on the toilet paper. If you underwent a bowel prep for your procedure, you may not have a normal bowel movement for a few days.  Please Note:  You might notice some irritation and congestion in your nose or some drainage.  This is from the oxygen used during your procedure.  There is no need for concern and it should clear up in a day or so.  SYMPTOMS TO REPORT IMMEDIATELY:   Following lower endoscopy (colonoscopy or flexible sigmoidoscopy):  Excessive amounts of blood in the stool  Significant tenderness or worsening of abdominal pains  Swelling of the abdomen that is new, acute  Fever of 100F or higher   For urgent or emergent issues, a gastroenterologist can be reached at any hour by calling [phone]. Please read all handouts given to you by your recovery nurse.   DIET:  We do recommend a small meal at first, but then you may proceed to your regular diet.  Drink plenty of fluids but you should avoid alcoholic beverages for 24 hours.  ACTIVITY:  You should plan to take it easy for the rest of today and you should NOT DRIVE or use heavy machinery until  tomorrow (because of the sedation medicines used during the test).    FOLLOW UP: Our staff will call the number listed on your records the next business day following your procedure to check on you and address any questions or concerns that you may have regarding the information given to you following your procedure. If we do not reach you, we will leave a message.  However, if you are feeling well and you are not experiencing any problems, there is no need to return our call.  We will assume that you have returned to your regular daily activities without incident.  If any biopsies were taken you will be contacted by phone or by letter within the next 1-3 weeks.  Please call us at [phone] if you have not heard about the biopsies in 3 weeks.    SIGNATURES/CONFIDENTIALITY: You and/or your care partner have signed paperwork which will be entered into your electronic medical record.  These signatures attest to the fact that that the information above on your After Visit Summary has been reviewed and is understood.  Full responsibility of the confidentiality of this discharge information lies with you and/or your care-partner.  Thank you for letting us take care of your healthcare needs today. 

## 2017-04-23 NOTE — Op Note (Signed)
[facility] Patient Name: Leah Jensen Procedure Date: 04/23/2017 8:56 AM MRN: [id number] Endoscopist: Ladene Artist , MD Age: 71 Referring MD:  Date of Birth: 02-20-1946 Gender: Female Account #: [id number] Procedure:                Colonoscopy Indications:              Surveillance: Personal history of adenomatous                            polyps on last colonoscopy 5 years ago Medicines:                Monitored Anesthesia Care Procedure:                Pre-Anesthesia Assessment:                           - Prior to the procedure, a History and Physical                            was performed, and patient medications and                            allergies were reviewed. The patient's tolerance of                            previous anesthesia was also reviewed. The risks                            and benefits of the procedure and the sedation                            options and risks were discussed with the patient.                            All questions were answered, and informed consent                            was obtained. Prior Anticoagulants: The patient has                            taken no previous anticoagulant or antiplatelet                            agents. ASA Grade Assessment: II - A patient with                            mild systemic disease. After reviewing the risks                            and benefits, the patient was deemed in                            satisfactory condition to undergo the procedure.  After obtaining informed consent, the colonoscope                            was passed under direct vision. Throughout the                            procedure, the patient's blood pressure, pulse, and                            oxygen saturations were monitored continuously. The                            Colonoscope was introduced through the anus and                            advanced to the the  cecum, identified by                            appendiceal orifice and ileocecal valve. The                            ileocecal valve, appendiceal orifice, and rectum                            were photographed. The quality of the bowel                            preparation was good. The colonoscopy was performed                            without difficulty. The patient tolerated the                            procedure well. Scope In: 9:18:24 AM Scope Out: 9:30:05 AM Scope Withdrawal Time: 0 hours 7 minutes 43 seconds  Total Procedure Duration: 0 hours 11 minutes 41 seconds  Findings:                 The perianal and digital rectal examinations were                            normal.                           A 6 mm polyp was found in the descending colon. The                            polyp was sessile. The polyp was removed with a                            cold snare. Resection and retrieval were complete.                           Internal hemorrhoids were found during  retroflexion. The hemorrhoids were small and Grade                            I (internal hemorrhoids that do not prolapse).                           The exam was otherwise without abnormality on                            direct and retroflexion views. Complications:            No immediate complications. Estimated blood loss:                            None. Estimated Blood Loss:     Estimated blood loss: none. Impression:               - One 6 mm polyp in the descending colon, removed                            with a cold snare. Resected and retrieved.                           - Internal hemorrhoids.                           - The examination was otherwise normal on direct                            and retroflexion views. Recommendation:           - Repeat colonoscopy in 5 years for surveillance.                           - Patient has a contact number available for                             emergencies. The signs and symptoms of potential                            delayed complications were discussed with the                            patient. Return to normal activities tomorrow.                            Written discharge instructions were provided to the                            patient.                           - Resume previous diet.                           - Continue present medications.                           -  Await pathology results. Ladene Artist, MD 04/23/2017 9:36:08 AM This report has been signed electronically.

## 2017-04-24 ENCOUNTER — Telehealth: Payer: Self-pay | Admitting: *Deleted

## 2017-04-24 NOTE — Telephone Encounter (Signed)
  Follow up Call-  Call back number 04/23/2017  Post procedure Call Back phone  # [phone]  Permission to leave phone message Yes  Some recent data might be hidden     Patient questions:  Do you have a fever, pain , or abdominal swelling? No. Pain Score  0 *  Have you tolerated food without any problems? Yes.    Have you been able to return to your normal activities? Yes.    Do you have any questions about your discharge instructions: Diet   No. Medications  No. Follow up visit  No.  Do you have questions or concerns about your Care? No.  Actions: * If pain score is 4 or above: No action needed, pain <4.  patient "sneezing", explained to her that it may be from the o2 that was used. Explained OTC tx if needed.

## 2017-04-27 DIAGNOSIS — H25012 Cortical age-related cataract, left eye: Secondary | ICD-10-CM | POA: Diagnosis not present

## 2017-04-30 ENCOUNTER — Encounter: Payer: Self-pay | Admitting: Gastroenterology

## 2017-05-20 DIAGNOSIS — H524 Presbyopia: Secondary | ICD-10-CM | POA: Diagnosis not present

## 2017-05-20 DIAGNOSIS — H25012 Cortical age-related cataract, left eye: Secondary | ICD-10-CM | POA: Diagnosis not present

## 2017-05-20 DIAGNOSIS — H2513 Age-related nuclear cataract, bilateral: Secondary | ICD-10-CM | POA: Diagnosis not present

## 2017-06-02 DIAGNOSIS — H25012 Cortical age-related cataract, left eye: Secondary | ICD-10-CM | POA: Diagnosis not present

## 2017-06-02 DIAGNOSIS — H2512 Age-related nuclear cataract, left eye: Secondary | ICD-10-CM | POA: Diagnosis not present

## 2017-06-02 DIAGNOSIS — H25812 Combined forms of age-related cataract, left eye: Secondary | ICD-10-CM | POA: Diagnosis not present

## 2017-06-23 DIAGNOSIS — S92425A Nondisplaced fracture of distal phalanx of left great toe, initial encounter for closed fracture: Secondary | ICD-10-CM | POA: Diagnosis not present

## 2017-06-30 DIAGNOSIS — H2511 Age-related nuclear cataract, right eye: Secondary | ICD-10-CM | POA: Diagnosis not present

## 2017-06-30 DIAGNOSIS — H25811 Combined forms of age-related cataract, right eye: Secondary | ICD-10-CM | POA: Diagnosis not present

## 2017-07-22 DIAGNOSIS — S92425D Nondisplaced fracture of distal phalanx of left great toe, subsequent encounter for fracture with routine healing: Secondary | ICD-10-CM | POA: Diagnosis not present

## 2017-07-22 DIAGNOSIS — M25561 Pain in right knee: Secondary | ICD-10-CM | POA: Diagnosis not present

## 2017-09-08 DIAGNOSIS — H35372 Puckering of macula, left eye: Secondary | ICD-10-CM | POA: Diagnosis not present

## 2017-09-14 ENCOUNTER — Ambulatory Visit (INDEPENDENT_AMBULATORY_CARE_PROVIDER_SITE_OTHER): Payer: Medicare HMO | Admitting: Internal Medicine

## 2017-09-14 ENCOUNTER — Encounter: Payer: Self-pay | Admitting: Internal Medicine

## 2017-09-14 VITALS — BP 98/60 | HR 59 | Temp 97.6°F | Ht 63.0 in | Wt 127.0 lb

## 2017-09-14 DIAGNOSIS — Z01810 Encounter for preprocedural cardiovascular examination: Secondary | ICD-10-CM | POA: Diagnosis not present

## 2017-09-14 NOTE — Patient Instructions (Signed)
You are okay to get the surgery done and we will let the surgeon know.

## 2017-09-14 NOTE — Progress Notes (Signed)
   Subjective:    Patient ID: Leah Jensen, female    DOB: 02/14/1946, 72 y.o.   MRN: [id number]  HPI The patient is a 72 YO female coming in for surgical clearance. She is getting right knee replacement. She is not able to exercise as much as she used to. Is able to climb a flight of stairs without stopping. Denies chest pains or SOB. She denies headaches or stomach pain. Non-smoker. The surgery is scheduled for about a month from now. She previously has had this replaced and there is some concern about the joint loosening to cause her pain. Denies new medications. She feels well overall except for the constant pain in her knee. Does not take aspirin or nsaids for pain. Takes only tylenol.   PMH, [facility], social history reviewed and updated  Review of Systems  Constitutional: Negative.   HENT: Negative.   Eyes: Negative.   Respiratory: Negative for cough, chest tightness and shortness of breath.   Cardiovascular: Negative for chest pain, palpitations and leg swelling.  Gastrointestinal: Negative for abdominal distention, abdominal pain, constipation, diarrhea, nausea and vomiting.  Musculoskeletal: Positive for arthralgias.  Skin: Negative.   Neurological: Negative.   Psychiatric/Behavioral: Negative.       Objective:   Physical Exam  Constitutional: She is oriented to person, place, and time. She appears well-developed and well-nourished.  HENT:  Head: Normocephalic and atraumatic.  Eyes: EOM are normal.  Neck: Normal range of motion.  Cardiovascular: Normal rate and regular rhythm.  Pulmonary/Chest: Effort normal and breath sounds normal. No respiratory distress. She has no wheezes. She has no rales.  Abdominal: Soft. Bowel sounds are normal. She exhibits no distension. There is no tenderness. There is no rebound.  Musculoskeletal: She exhibits no edema.  Neurological: She is alert and oriented to person, place, and time. Coordination normal.  Skin: Skin is warm and dry.    Psychiatric: She has a normal mood and affect.   Vitals:   09/14/17 1311  BP: 98/60  Pulse: (!) 59  Temp: 97.6 F (36.4 C)  TempSrc: Oral  SpO2: 98%  Weight: 127 lb (57.6 kg)  Height: 5\' 3"  (1.6 m)   EKG: rate 58, axis normal, intervals normal, sinus, no st or t wave changes, no change from compared to Oct 2016    Assessment & Plan:

## 2017-09-14 NOTE — Progress Notes (Deleted)
ek 

## 2017-09-14 NOTE — Assessment & Plan Note (Signed)
EKG done today without changes and normal. She is not on any medications which require change or stop prior to surgery. She is non-smoker. Have filled out form to be faxed to her surgeon indicating low risk and should proceed with knee replacement. No labs today as she already has a pre-op visit likely with labs and prior normal.

## 2017-09-15 DIAGNOSIS — F330 Major depressive disorder, recurrent, mild: Secondary | ICD-10-CM | POA: Diagnosis not present

## 2017-09-25 DIAGNOSIS — S92425D Nondisplaced fracture of distal phalanx of left great toe, subsequent encounter for fracture with routine healing: Secondary | ICD-10-CM | POA: Diagnosis not present

## 2017-09-25 DIAGNOSIS — T84012A Broken internal right knee prosthesis, initial encounter: Secondary | ICD-10-CM

## 2017-09-25 NOTE — H&P (Signed)
PREOPERATIVE H&P Patient ID: CHRISHAWN KRING MRN: [id number] DOB/AGE: 04-27-1946 72 y.o.  Chief Complaint: FAILURE RIGHT TOTAL KNEE Original Surgery by Dr. Amada Jupiter June 27, 2015.  Planned Procedure Date: 10/19/17 Medical and Cardiac Clearance by Dr. Pricilla Holm  HPI: LAKELY ELMENDORF is a 72 y.o. female with a history of depression/anxiety who presents for evaluation of FAILURE RIGHT TOTAL KNEE.   She has had persistent mainly tibial right knee pain since early on postoperatively, now approximately two years status post right total knee arthroplasty by Dr. Amada Jupiter.  Date of surgery: June 27, 2015.  She notes two separate traumatic events, one with passive range of motion in physical therapy and another a fall.  She denies any history of fever or systemic symptoms at any time during her postoperative course.  She has had two bone scans, most recent performed in March of 2018 which showed increased blood flow and blood pool tracer with significantly increased delayed localization tracer at the proximal right tibia, concerning for prosthetic loosening or infection.  She has tried activity modification, therapy, Tylenol and extensive use of bone stimulator.  Dr. Amada Jupiter has discussed surgery with her in the past which would be to a revision stem tibial component.  She is quite frustrated with this problem, having the above measures not been successful.   Labs have not suggested infection.  Past Medical History:  Diagnosis Date  . ALLERGIC RHINITIS   . Anxiety   . Bipolar disorder ([place])   . Cataract    slight on lt. eye  . Chronic kidney disease    patient denies  . DEPRESSION/ANXIETY   . Low back pain   . Osteoarthritis    "probably qwhere/Dr. Percell Miller" (06/28/2015)  . 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 TREMOR, ESSENTIAL    Past Surgical History:  Procedure Laterality Date  . ANAL FISSURE  REPAIR  1990's  . BREAST BIOPSY Left ~ 1990  . CATARACT EXTRACTION Bilateral 06/02/2017 & 06/30/2017  . child birth     x's 2 (28 & 43)  . COLONOSCOPY  2013  . JOINT REPLACEMENT     rt. knee  . POLYPECTOMY    . TOTAL KNEE ARTHROPLASTY Right 06/27/2015   Procedure: TOTAL KNEE ARTHROPLASTY;  Surgeon: Ninetta Lights, MD;  Location: [facility];  Service: Orthopedics;  Laterality: Right;   Allergies: Narcotic medicines (Percocet, Vicodin, morphine) have not effectively manage pain.  She prefers to take Tylenol. Amoxicillin/penicillins: Rash  Medications: Multivitamin daily Vitamin D 1000 mg twice daily Calcium 1000 mg daily B complex daily Pristiq O 50 mg daily [place] 25 mg daily Propanolol 20 mg 1-2 daily Clonazepam 0.5 mg 1-2 daily Estradiol progesterone formulated every other day  Social history: Single, never smoker.  No EtOH.  Lives alone in a one-story residence.  Plans to stay with her daughter for the immediate postoperative phase.  Family history: Mother with heart disease, MI, CVA, cancer.  Father with MI lived to be over 30.  ROS: Currently denies lightheadedness, dizziness, Fever, chills, CP, SOB. No personal history of DVT, PE, MI, or CVA. No loose teeth or dentures All other systems have been reviewed and were otherwise currently negative with the exception of those mentioned in the HPI and as above.  Objective: Vitals: Ht: 5'5" Wt: 132.6 Temp: 97.2 BP: 106/61 Pulse: 64 O2 95% on room air. Physical Exam: General: Alert, NAD.  Antalgic HEENT: EOMI,  Good Neck Extension  Pulm: No increased work of breathing.  Clear B/L A/P w/o crackle or wheeze.  CV: RRR, No m/g/r appreciated  GI: soft, NT, ND Neuro: Neuro without gross focal deficit.  Sensation intact distally Skin: No lesions in the area of chief complaint MSK/Surgical Site: Right knee w/o redness or effusion.  Tender proximal medial tibia. No JLT. ROM 0-115 vs 0-120 on the left.  5/5 strength in extension  and flexion.  +EHL/FHL.  NVI.  Stable varus and valgus stress.    Imaging Review Bone scan performed in March of 2018 showed increased blood flow and blood pool tracer with significantly increased delayed localization tracer at the proximal right tibia, concerning for prosthetic loosening or infection.  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 The treatment options including medical management, and arthroplasty were discussed at length. The risks and benefits of Procedure(s): TOTAL KNEE ARTHROPLASTY WITH REVISION COMPONENTS were presented and reviewed.  The risks of nonoperative treatment, versus surgical intervention including but not limited to continued pain, aseptic loosening, stiffness, dislocation/subluxation, infection, bleeding, nerve injury, blood clots, cardiopulmonary complications, morbidity, mortality, among others were discussed. The patient verbalizes understanding and wishes to proceed with the plan.  Patient is being admitted for inpatient treatment for surgery, pain control, PT, OT, prophylactic antibiotics, VTE prophylaxis, progressive ambulation, ADL's and discharge planning.   Dental prophylaxis discussed and recommended for 2 years postoperatively.     Implants: Stryker triathlon prosthesis, cemented, pegged, cruciate retaining #3 femoral component.  Cemented #3 tibial component.  9 mm polyethylene insert.  Cemented resurfacing 32 mm patella component.    The patient does meet the criteria for TXA which will be used perioperatively.     ASA 325 mg will be used postoperatively for DVT prophylaxis in addition to SCDs, and early ambulation.  Plan for scheduled Tylenol, Celebrex.  Ultram for as needed pain-may need to amend this.  Not previously controlled with opiates.  The patient is planning to be discharged home with home health services ([place]) in care of her daughter Nira Conn.  [phone].  [id number] [place].  Severity of Illness: The appropriate patient status for this patient is OBSERVATION. Observation status is judged to be reasonable and necessary in order to provide the required intensity of service to ensure the patient's safety. The patient's presenting symptoms, physical exam findings, and initial radiographic and laboratory data in the context of their medical condition is felt to place them at decreased risk for further clinical deterioration. Furthermore, it is anticipated that the patient will be medically stable for discharge from the hospital within 2 midnights of admission. The following factors support the patient status of observation.    Prudencio Burly III, PA-C 09/25/2017 8:33 AM

## 2017-10-05 ENCOUNTER — Encounter (HOSPITAL_COMMUNITY): Payer: Self-pay

## 2017-10-05 ENCOUNTER — Other Ambulatory Visit: Payer: Self-pay

## 2017-10-05 ENCOUNTER — Encounter (HOSPITAL_COMMUNITY)
Admission: RE | Admit: 2017-10-05 | Discharge: 2017-10-05 | Disposition: A | Discharge status: Home / Self Care | Payer: Medicare HMO | Source: Ambulatory Visit | Attending: Orthopedic Surgery | Admitting: Orthopedic Surgery

## 2017-10-05 DIAGNOSIS — Z8249 Family history of ischemic heart disease and other diseases of the circulatory system: Secondary | ICD-10-CM | POA: Insufficient documentation

## 2017-10-05 DIAGNOSIS — M810 Age-related osteoporosis without current pathological fracture: Secondary | ICD-10-CM | POA: Diagnosis not present

## 2017-10-05 DIAGNOSIS — Z0183 Encounter for blood typing: Secondary | ICD-10-CM | POA: Diagnosis not present

## 2017-10-05 DIAGNOSIS — Z823 Family history of stroke: Secondary | ICD-10-CM | POA: Diagnosis not present

## 2017-10-05 DIAGNOSIS — Y838 Other surgical procedures as the cause of abnormal reaction of the patient, or of later complication, without mention of misadventure at the time of the procedure: Secondary | ICD-10-CM | POA: Diagnosis not present

## 2017-10-05 DIAGNOSIS — F419 Anxiety disorder, unspecified: Secondary | ICD-10-CM | POA: Diagnosis not present

## 2017-10-05 DIAGNOSIS — Z01812 Encounter for preprocedural laboratory examination: Secondary | ICD-10-CM | POA: Insufficient documentation

## 2017-10-05 DIAGNOSIS — Z880 Allergy status to penicillin: Secondary | ICD-10-CM | POA: Diagnosis not present

## 2017-10-05 DIAGNOSIS — Z79899 Other long term (current) drug therapy: Secondary | ICD-10-CM | POA: Diagnosis not present

## 2017-10-05 DIAGNOSIS — Z885 Allergy status to narcotic agent status: Secondary | ICD-10-CM | POA: Insufficient documentation

## 2017-10-05 DIAGNOSIS — F319 Bipolar disorder, unspecified: Secondary | ICD-10-CM | POA: Diagnosis not present

## 2017-10-05 DIAGNOSIS — T84092A Other mechanical complication of internal right knee prosthesis, initial encounter: Secondary | ICD-10-CM | POA: Diagnosis not present

## 2017-10-05 HISTORY — DX: Palpitations: R00.2

## 2017-10-05 LAB — SURGICAL PCR SCREEN
MRSA, PCR: NEGATIVE
STAPHYLOCOCCUS AUREUS: NEGATIVE

## 2017-10-05 LAB — TYPE AND SCREEN
ABO/RH(D): O NEG
ANTIBODY SCREEN: NEGATIVE

## 2017-10-05 LAB — BASIC METABOLIC PANEL
Anion gap: 12 (ref 5–15)
BUN: 19 mg/dL (ref 6–20)
CHLORIDE: 103 mmol/L (ref 101–111)
CO2: 23 mmol/L (ref 22–32)
CREATININE: 0.74 mg/dL (ref 0.44–1.00)
Calcium: 9.2 mg/dL (ref 8.9–10.3)
GFR calc Af Amer: >60 mL/min (ref >60)
GFR calc non Af Amer: >60 mL/min (ref >60)
GLUCOSE: 94 mg/dL (ref 65–99)
Potassium: 4 mmol/L (ref 3.5–5.1)
SODIUM: 138 mmol/L (ref 135–145)

## 2017-10-05 LAB — CBC
HCT: 43 % (ref 36.0–46.0)
Hemoglobin: 14 g/dL (ref 12.0–15.0)
MCH: 30.6 pg (ref 26.0–34.0)
MCHC: 32.6 g/dL (ref 30.0–36.0)
MCV: 93.9 fL (ref 78.0–100.0)
PLATELETS: 253 10*3/uL (ref 150–400)
RBC: 4.58 MIL/uL (ref 3.87–5.11)
RDW: 13.7 % (ref 11.5–15.5)
WBC: 5 10*3/uL (ref 4.0–10.5)

## 2017-10-05 NOTE — Pre-Procedure Instructions (Signed)
Leah Jensen  10/05/2017      [place], [place] [address] Phone: [phone] Fax: [phone]    Your procedure is scheduled on October 19, 2017.  Report to [facility] Admitting at 12:00 PM--Noon.  Call this number if you have problems the morning of surgery:  [phone]   Remember:  Do not eat food or drink liquids after midnight.   Take these medicines the morning of surgery with A SIP OF WATER : Propranolol (Inderal) Clonazepam (Klonopin)  Eye drops if needed Tylenol if needed  7 days prior to surgery STOP taking any Aspirin (unless otherwise instructed by your surgeon), Aleve, Naproxen, Ibuprofen, Motrin, Advil, Goody's, BC's, all herbal medications, fish oil, and all vitamins.   Do not wear jewelry, make-up or nail polish.  Do not wear lotions, powders, or perfumes, or deodorant.  Do not shave 48 hours prior to surgery.   Do not bring valuables to the hospital.   [facility] is not responsible for any belongings or valuables.  Contacts, dentures or bridgework may not be worn into surgery.  Leave your suitcase in the car.  After surgery it may be brought to your room.  For patients admitted to the hospital, discharge time will be determined by your treatment team.  Patients discharged the day of surgery will not be allowed to drive home.   Special instructions:   [name]- Preparing For Surgery  Before surgery, you can play an important role. Because skin is not sterile, your skin needs to be as free of germs as possible. You can reduce the number of germs on your skin by washing with CHG (chlorahexidine gluconate) Soap before surgery.  CHG is an antiseptic cleaner which kills germs and bonds with the skin to continue killing germs even after washing.  Please do not use if you have an allergy to CHG or antibacterial soaps. If your skin becomes  reddened/irritated stop using the CHG.  Do not shave (including legs and underarms) for at least 48 hours prior to first CHG shower. It is OK to shave your face.  Please follow these instructions carefully.   1. Shower the NIGHT BEFORE SURGERY and the MORNING OF SURGERY with CHG.   2. If you chose to wash your hair, wash your hair first as usual with your normal shampoo.  3. After you shampoo, rinse your hair and body thoroughly to remove the shampoo.  4. Use CHG as you would any other liquid soap. You can apply CHG directly to the skin and wash gently with a scrungie or a clean washcloth.   5. Apply the CHG Soap to your body ONLY FROM THE NECK DOWN.  Do not use on open wounds or open sores. Avoid contact with your eyes, ears, mouth and genitals (private parts). Wash Face and genitals (private parts)  with your normal soap.  6. Wash thoroughly, paying special attention to the area where your surgery will be performed.  7. Thoroughly rinse your body with warm water from the neck down.  8. DO NOT shower/wash with your normal soap after using and rinsing off the CHG Soap.  9. Pat yourself dry with a CLEAN TOWEL.  10. Wear CLEAN PAJAMAS to bed the night before surgery, wear comfortable clothes the morning of surgery  11. Place CLEAN SHEETS on your bed the night of your first shower and DO NOT SLEEP WITH  PETS.    Day of Surgery: Do not apply any deodorants/lotions. Please wear clean clothes to the hospital/surgery center.      Please read over the following fact sheets that you were given.

## 2017-10-05 NOTE — Progress Notes (Addendum)
PCP: Dr. Sharlet Salina, last saw in Jan 2019 Cardiologist: Denies  EKG: Jan 2019 CXR: denies ECHO: 04/2015  Stress Test: Denies Cardiac Cath: Denies  Patient denies shortness of breath, fever, cough, and chest pain at PAT appointment.  Patient verbalized understanding of instructions provided today at the PAT appointment.  Patient asked to review instructions at home and day of surgery.   Lemar Lofty PA-C sent IB msg regarding Ancef order/pt's PCN allergy.

## 2017-10-08 NOTE — Progress Notes (Signed)
Return IB msg received from [facility] PA-C---Ancef ok for surgery

## 2017-10-16 MED ORDER — BUPIVACAINE LIPOSOME 1.3 % IJ SUSP
20.0000 mL | INTRAMUSCULAR | Status: AC
  Administered 2017-10-19: 20 mL
  Filled 2017-10-16: qty 20

## 2017-10-16 MED ORDER — TRANEXAMIC ACID 1000 MG/10ML IV SOLN
1000.0000 mg | INTRAVENOUS | Status: AC
  Administered 2017-10-19: 1000 mg via INTRAVENOUS
  Filled 2017-10-16: qty 1.1

## 2017-10-19 ENCOUNTER — Encounter (HOSPITAL_COMMUNITY): Payer: Self-pay | Admitting: *Deleted

## 2017-10-19 ENCOUNTER — Observation Stay (HOSPITAL_COMMUNITY)
Admission: RE | Admit: 2017-10-19 | Discharge: 2017-10-20 | Disposition: A | Discharge status: Home / Self Care | Payer: Medicare HMO | Source: Ambulatory Visit | Attending: Orthopedic Surgery | Admitting: Orthopedic Surgery

## 2017-10-19 ENCOUNTER — Encounter (HOSPITAL_COMMUNITY)
Admission: RE | Disposition: A | Discharge status: Home / Self Care | Payer: Self-pay | Source: Ambulatory Visit | Attending: Orthopedic Surgery

## 2017-10-19 ENCOUNTER — Inpatient Hospital Stay (HOSPITAL_COMMUNITY): Payer: Medicare HMO | Admitting: Anesthesiology

## 2017-10-19 ENCOUNTER — Observation Stay (HOSPITAL_COMMUNITY): Payer: Medicare HMO

## 2017-10-19 DIAGNOSIS — M810 Age-related osteoporosis without current pathological fracture: Secondary | ICD-10-CM | POA: Diagnosis not present

## 2017-10-19 DIAGNOSIS — M25561 Pain in right knee: Secondary | ICD-10-CM | POA: Insufficient documentation

## 2017-10-19 DIAGNOSIS — T84498A Other mechanical complication of other internal orthopedic devices, implants and grafts, initial encounter: Secondary | ICD-10-CM | POA: Diagnosis not present

## 2017-10-19 DIAGNOSIS — G250 Essential tremor: Secondary | ICD-10-CM | POA: Diagnosis not present

## 2017-10-19 DIAGNOSIS — F319 Bipolar disorder, unspecified: Secondary | ICD-10-CM | POA: Insufficient documentation

## 2017-10-19 DIAGNOSIS — Z96659 Presence of unspecified artificial knee joint: Secondary | ICD-10-CM

## 2017-10-19 DIAGNOSIS — F419 Anxiety disorder, unspecified: Secondary | ICD-10-CM | POA: Insufficient documentation

## 2017-10-19 DIAGNOSIS — Z471 Aftercare following joint replacement surgery: Secondary | ICD-10-CM | POA: Diagnosis not present

## 2017-10-19 DIAGNOSIS — Y831 Surgical operation with implant of artificial internal device as the cause of abnormal reaction of the patient, or of later complication, without mention of misadventure at the time of the procedure: Secondary | ICD-10-CM | POA: Diagnosis not present

## 2017-10-19 DIAGNOSIS — Z96651 Presence of right artificial knee joint: Secondary | ICD-10-CM | POA: Diagnosis not present

## 2017-10-19 DIAGNOSIS — M1711 Unilateral primary osteoarthritis, right knee: Secondary | ICD-10-CM | POA: Diagnosis not present

## 2017-10-19 DIAGNOSIS — M1710 Unilateral primary osteoarthritis, unspecified knee: Secondary | ICD-10-CM | POA: Diagnosis present

## 2017-10-19 DIAGNOSIS — Z79890 Hormone replacement therapy (postmenopausal): Secondary | ICD-10-CM | POA: Diagnosis not present

## 2017-10-19 DIAGNOSIS — G8918 Other acute postprocedural pain: Secondary | ICD-10-CM | POA: Diagnosis not present

## 2017-10-19 DIAGNOSIS — T84092A Other mechanical complication of internal right knee prosthesis, initial encounter: Secondary | ICD-10-CM | POA: Diagnosis not present

## 2017-10-19 DIAGNOSIS — Z79899 Other long term (current) drug therapy: Secondary | ICD-10-CM | POA: Insufficient documentation

## 2017-10-19 DIAGNOSIS — T84012A Broken internal right knee prosthesis, initial encounter: Secondary | ICD-10-CM

## 2017-10-19 HISTORY — PX: TOTAL KNEE ARTHROPLASTY WITH REVISION COMPONENTS: SHX6198

## 2017-10-19 SURGERY — TOTAL KNEE ARTHROPLASTY WITH REVISION COMPONENTS
Anesthesia: Monitor Anesthesia Care | Laterality: Right

## 2017-10-19 MED ORDER — SENNA 8.6 MG PO TABS
1.0000 | ORAL_TABLET | Freq: Two times a day (BID) | ORAL | Status: DC
  Administered 2017-10-19 – 2017-10-20 (×2): 8.6 mg via ORAL
  Filled 2017-10-19 (×2): qty 1

## 2017-10-19 MED ORDER — PHENOL 1.4 % MT LIQD: 1.0000 | OROMUCOSAL | Status: DC | PRN

## 2017-10-19 MED ORDER — CLONAZEPAM 0.5 MG PO TABS: 0.2500 mg | ORAL_TABLET | Freq: Three times a day (TID) | ORAL | Status: DC | PRN

## 2017-10-19 MED ORDER — PANTOPRAZOLE SODIUM 40 MG PO TBEC
40.0000 mg | DELAYED_RELEASE_TABLET | Freq: Once | ORAL | Status: AC
  Administered 2017-10-19: 40 mg via ORAL
  Filled 2017-10-19: qty 1

## 2017-10-19 MED ORDER — LACTATED RINGERS IV SOLN
INTRAVENOUS | Status: DC
  Administered 2017-10-19: 12:00:00 via INTRAVENOUS

## 2017-10-19 MED ORDER — DOCUSATE SODIUM 100 MG PO CAPS
100.0000 mg | ORAL_CAPSULE | Freq: Two times a day (BID) | ORAL | Status: DC
  Administered 2017-10-19 – 2017-10-20 (×2): 100 mg via ORAL
  Filled 2017-10-19: qty 1

## 2017-10-19 MED ORDER — BUPIVACAINE-EPINEPHRINE (PF) 0.5% -1:200000 IJ SOLN
INTRAMUSCULAR | Status: DC | PRN
  Administered 2017-10-19: 30 mL via PERINEURAL

## 2017-10-19 MED ORDER — TRAMADOL HCL 50 MG PO TABS: 50.0000 mg | ORAL_TABLET | Freq: Four times a day (QID) | ORAL | 0 refills | Status: DC | PRN

## 2017-10-19 MED ORDER — PROPOFOL 500 MG/50ML IV EMUL
INTRAVENOUS | Status: DC | PRN
  Administered 2017-10-19: 60 ug/kg/min via INTRAVENOUS

## 2017-10-19 MED ORDER — TRANEXAMIC ACID 1000 MG/10ML IV SOLN
2000.0000 mg | Freq: Once | INTRAVENOUS | Status: DC
  Filled 2017-10-19: qty 20

## 2017-10-19 MED ORDER — SORBITOL 70 % SOLN: 30.0000 mL | Freq: Every day | Status: DC | PRN

## 2017-10-19 MED ORDER — MENTHOL 3 MG MT LOZG: 1.0000 | LOZENGE | OROMUCOSAL | Status: DC | PRN

## 2017-10-19 MED ORDER — METOCLOPRAMIDE HCL 5 MG PO TABS: 5.0000 mg | ORAL_TABLET | Freq: Three times a day (TID) | ORAL | Status: DC | PRN

## 2017-10-19 MED ORDER — METOCLOPRAMIDE HCL 5 MG/ML IJ SOLN: 5.0000 mg | Freq: Three times a day (TID) | INTRAMUSCULAR | Status: DC | PRN

## 2017-10-19 MED ORDER — BUPIVACAINE HCL (PF) 0.25 % IJ SOLN
INTRAMUSCULAR | Status: AC
  Filled 2017-10-19: qty 30

## 2017-10-19 MED ORDER — BACLOFEN 10 MG PO TABS: 10.0000 mg | ORAL_TABLET | Freq: Three times a day (TID) | ORAL | 0 refills | Status: DC | PRN

## 2017-10-19 MED ORDER — DIPHENHYDRAMINE HCL 12.5 MG/5ML PO ELIX: 12.5000 mg | ORAL_SOLUTION | ORAL | Status: DC | PRN

## 2017-10-19 MED ORDER — CARBOXYMETHYLCELLULOSE SODIUM 0.25 % OP SOLN: 1.0000 [drp] | Freq: Four times a day (QID) | OPHTHALMIC | Status: DC | PRN

## 2017-10-19 MED ORDER — TRANEXAMIC ACID 1000 MG/10ML IV SOLN
INTRAVENOUS | Status: AC | PRN
  Administered 2017-10-19: 2000 mg via TOPICAL

## 2017-10-19 MED ORDER — MIDAZOLAM HCL 2 MG/2ML IJ SOLN
INTRAMUSCULAR | Status: AC
  Filled 2017-10-19: qty 2

## 2017-10-19 MED ORDER — ONDANSETRON HCL 4 MG/2ML IJ SOLN: 4.0000 mg | Freq: Four times a day (QID) | INTRAMUSCULAR | Status: DC | PRN

## 2017-10-19 MED ORDER — OXYCODONE HCL 5 MG PO TABS: 5.0000 mg | ORAL_TABLET | ORAL | Status: DC | PRN

## 2017-10-19 MED ORDER — ACETAMINOPHEN 500 MG PO TABS: 1000.0000 mg | ORAL_TABLET | Freq: Three times a day (TID) | ORAL | 0 refills | Status: AC

## 2017-10-19 MED ORDER — ONDANSETRON HCL 4 MG/2ML IJ SOLN
INTRAMUSCULAR | Status: DC | PRN
  Administered 2017-10-19: 4 mg via INTRAVENOUS

## 2017-10-19 MED ORDER — CEFAZOLIN SODIUM-DEXTROSE 2-4 GM/100ML-% IV SOLN
2.0000 g | INTRAVENOUS | Status: AC
  Administered 2017-10-19: 2 g via INTRAVENOUS

## 2017-10-19 MED ORDER — BUPIVACAINE HCL (PF) 0.25 % IJ SOLN
INTRAMUSCULAR | Status: DC | PRN
  Administered 2017-10-19: 30 mL

## 2017-10-19 MED ORDER — BUPIVACAINE HCL (PF) 0.5 % IJ SOLN
INTRAMUSCULAR | Status: DC | PRN
  Administered 2017-10-19: 2.4 mL via INTRATHECAL

## 2017-10-19 MED ORDER — ONDANSETRON HCL 4 MG PO TABS: 4.0000 mg | ORAL_TABLET | Freq: Three times a day (TID) | ORAL | 0 refills | Status: DC | PRN

## 2017-10-19 MED ORDER — CELECOXIB 200 MG PO CAPS
200.0000 mg | ORAL_CAPSULE | Freq: Once | ORAL | Status: AC
  Administered 2017-10-19: 200 mg via ORAL
  Filled 2017-10-19: qty 1

## 2017-10-19 MED ORDER — FENTANYL CITRATE (PF) 250 MCG/5ML IJ SOLN
INTRAMUSCULAR | Status: AC
  Filled 2017-10-19: qty 5

## 2017-10-19 MED ORDER — DOCUSATE SODIUM 100 MG PO CAPS: 100.0000 mg | ORAL_CAPSULE | Freq: Two times a day (BID) | ORAL | 0 refills | Status: DC

## 2017-10-19 MED ORDER — LAMOTRIGINE 25 MG PO TABS
25.0000 mg | ORAL_TABLET | Freq: Every day | ORAL | Status: DC
  Administered 2017-10-19: 25 mg via ORAL
  Filled 2017-10-19 (×2): qty 1

## 2017-10-19 MED ORDER — POLYETHYLENE GLYCOL 3350 17 G PO PACK: 17.0000 g | PACK | Freq: Every day | ORAL | Status: DC | PRN

## 2017-10-19 MED ORDER — SODIUM CHLORIDE FLUSH 0.9 % IV SOLN
INTRAVENOUS | Status: DC | PRN
Start: 2017-10-19 — End: 2017-10-19
  Administered 2017-10-19: 30 mL

## 2017-10-19 MED ORDER — ACETAMINOPHEN 500 MG PO TABS
1000.0000 mg | ORAL_TABLET | Freq: Once | ORAL | Status: AC
  Administered 2017-10-19: 1000 mg via ORAL
  Filled 2017-10-19: qty 2

## 2017-10-19 MED ORDER — CLONAZEPAM 0.25 MG PO TBDP: 0.2500 mg | ORAL_TABLET | Freq: Three times a day (TID) | ORAL | Status: DC | PRN

## 2017-10-19 MED ORDER — 0.9 % SODIUM CHLORIDE (POUR BTL) OPTIME
TOPICAL | Status: DC | PRN
  Administered 2017-10-19: 1000 mL

## 2017-10-19 MED ORDER — DEXAMETHASONE SODIUM PHOSPHATE 10 MG/ML IJ SOLN
10.0000 mg | Freq: Once | INTRAMUSCULAR | Status: AC
  Administered 2017-10-20: 10 mg via INTRAVENOUS
  Filled 2017-10-19: qty 1

## 2017-10-19 MED ORDER — LIDOCAINE HCL (CARDIAC) 20 MG/ML IV SOLN
INTRAVENOUS | Status: DC | PRN
  Administered 2017-10-19: 40 mg via INTRAVENOUS

## 2017-10-19 MED ORDER — FENTANYL CITRATE (PF) 250 MCG/5ML IJ SOLN
INTRAMUSCULAR | Status: DC | PRN
  Administered 2017-10-19: 50 ug via INTRAVENOUS

## 2017-10-19 MED ORDER — LACTATED RINGERS IV SOLN
INTRAVENOUS | Status: DC
  Administered 2017-10-19: 125 mL/h via INTRAVENOUS
  Administered 2017-10-20: 125 mL via INTRAVENOUS

## 2017-10-19 MED ORDER — POVIDONE-IODINE 10 % EX SWAB: 2.0000 "application " | Freq: Once | CUTANEOUS | Status: DC

## 2017-10-19 MED ORDER — ONDANSETRON HCL 4 MG PO TABS: 4.0000 mg | ORAL_TABLET | Freq: Four times a day (QID) | ORAL | Status: DC | PRN

## 2017-10-19 MED ORDER — ACETAMINOPHEN 500 MG PO TABS
ORAL_TABLET | ORAL | Status: AC
  Administered 2017-10-19: 1000 mg via ORAL
  Filled 2017-10-19: qty 2

## 2017-10-19 MED ORDER — ACETAMINOPHEN 500 MG PO TABS
1000.0000 mg | ORAL_TABLET | Freq: Three times a day (TID) | ORAL | Status: DC
  Administered 2017-10-19 – 2017-10-20 (×4): 1000 mg via ORAL
  Filled 2017-10-19 (×3): qty 2

## 2017-10-19 MED ORDER — ASPIRIN EC 325 MG PO TBEC
325.0000 mg | DELAYED_RELEASE_TABLET | Freq: Every day | ORAL | Status: DC
  Administered 2017-10-20: 325 mg via ORAL
  Filled 2017-10-19: qty 1

## 2017-10-19 MED ORDER — METHOCARBAMOL 500 MG PO TABS
ORAL_TABLET | ORAL | Status: AC
  Administered 2017-10-19: 500 mg via ORAL
  Filled 2017-10-19: qty 1

## 2017-10-19 MED ORDER — ACETAMINOPHEN 650 MG RE SUPP: 650.0000 mg | RECTAL | Status: DC | PRN

## 2017-10-19 MED ORDER — CEFAZOLIN SODIUM-DEXTROSE 2-4 GM/100ML-% IV SOLN
INTRAVENOUS | Status: AC
  Filled 2017-10-19: qty 100

## 2017-10-19 MED ORDER — ACETAMINOPHEN 325 MG PO TABS: 650.0000 mg | ORAL_TABLET | ORAL | Status: DC | PRN

## 2017-10-19 MED ORDER — SODIUM CHLORIDE 0.9 % IR SOLN
Status: DC | PRN
  Administered 2017-10-19: 3000 mL

## 2017-10-19 MED ORDER — CHLORHEXIDINE GLUCONATE 4 % EX LIQD: 60.0000 mL | Freq: Once | CUTANEOUS | Status: DC

## 2017-10-19 MED ORDER — HYDROMORPHONE HCL 1 MG/ML IJ SOLN: 0.5000 mg | INTRAMUSCULAR | Status: DC | PRN

## 2017-10-19 MED ORDER — OMEPRAZOLE 20 MG PO CPDR: 20.0000 mg | DELAYED_RELEASE_CAPSULE | Freq: Every day | ORAL | 0 refills | Status: DC

## 2017-10-19 MED ORDER — CEFAZOLIN SODIUM-DEXTROSE 1-4 GM/50ML-% IV SOLN
1.0000 g | Freq: Four times a day (QID) | INTRAVENOUS | Status: AC
  Administered 2017-10-19: 1 g via INTRAVENOUS
  Filled 2017-10-19 (×2): qty 50

## 2017-10-19 MED ORDER — CELECOXIB 200 MG PO CAPS: 200.0000 mg | ORAL_CAPSULE | Freq: Two times a day (BID) | ORAL | 0 refills | Status: AC

## 2017-10-19 MED ORDER — TRAMADOL HCL 50 MG PO TABS
50.0000 mg | ORAL_TABLET | Freq: Four times a day (QID) | ORAL | Status: DC | PRN
  Administered 2017-10-19: 50 mg via ORAL

## 2017-10-19 MED ORDER — CELECOXIB 200 MG PO CAPS
200.0000 mg | ORAL_CAPSULE | Freq: Two times a day (BID) | ORAL | Status: DC
  Administered 2017-10-19 – 2017-10-20 (×2): 200 mg via ORAL
  Filled 2017-10-19 (×2): qty 1

## 2017-10-19 MED ORDER — GLYCOPYRROLATE 0.2 MG/ML IJ SOLN
INTRAMUSCULAR | Status: DC | PRN
  Administered 2017-10-19: 0.2 mg via INTRAVENOUS

## 2017-10-19 MED ORDER — PROPOFOL 10 MG/ML IV BOLUS
INTRAVENOUS | Status: AC
  Filled 2017-10-19: qty 20

## 2017-10-19 MED ORDER — TRAMADOL HCL 50 MG PO TABS: 100.0000 mg | ORAL_TABLET | Freq: Four times a day (QID) | ORAL | Status: DC | PRN

## 2017-10-19 MED ORDER — FENTANYL CITRATE (PF) 100 MCG/2ML IJ SOLN: 50.0000 ug | Freq: Once | INTRAMUSCULAR | Status: DC

## 2017-10-19 MED ORDER — PROPRANOLOL HCL 20 MG PO TABS: 10.0000 mg | ORAL_TABLET | Freq: Three times a day (TID) | ORAL | Status: DC | PRN

## 2017-10-19 MED ORDER — TRAMADOL HCL 50 MG PO TABS
ORAL_TABLET | ORAL | Status: AC
  Filled 2017-10-19: qty 1

## 2017-10-19 MED ORDER — GABAPENTIN 300 MG PO CAPS
300.0000 mg | ORAL_CAPSULE | Freq: Once | ORAL | Status: AC
  Administered 2017-10-19: 300 mg via ORAL
  Filled 2017-10-19: qty 1

## 2017-10-19 MED ORDER — METHOCARBAMOL 500 MG PO TABS
500.0000 mg | ORAL_TABLET | Freq: Four times a day (QID) | ORAL | Status: DC | PRN
  Administered 2017-10-19 (×2): 500 mg via ORAL
  Filled 2017-10-19: qty 1

## 2017-10-19 MED ORDER — OXYCODONE HCL 5 MG PO TABS: 10.0000 mg | ORAL_TABLET | ORAL | Status: DC | PRN

## 2017-10-19 MED ORDER — MIDAZOLAM HCL 5 MG/5ML IJ SOLN
INTRAMUSCULAR | Status: DC | PRN
  Administered 2017-10-19: 2 mg via INTRAVENOUS

## 2017-10-19 MED ORDER — VENLAFAXINE HCL ER 75 MG PO CP24
75.0000 mg | ORAL_CAPSULE | Freq: Every day | ORAL | Status: DC
  Filled 2017-10-19: qty 1

## 2017-10-19 MED ORDER — FENTANYL CITRATE (PF) 100 MCG/2ML IJ SOLN
INTRAMUSCULAR | Status: AC
  Administered 2017-10-19: 50 ug
  Filled 2017-10-19: qty 2

## 2017-10-19 MED ORDER — DEXTROSE 5 % IV SOLN
500.0000 mg | Freq: Four times a day (QID) | INTRAVENOUS | Status: DC | PRN
  Filled 2017-10-19: qty 5

## 2017-10-19 MED ORDER — PHENYLEPHRINE HCL 10 MG/ML IJ SOLN
INTRAVENOUS | Status: DC | PRN
  Administered 2017-10-19: 50 ug/min via INTRAVENOUS

## 2017-10-19 MED ORDER — LACTATED RINGERS IV SOLN
INTRAVENOUS | Status: DC
  Administered 2017-10-19: 14:00:00 via INTRAVENOUS

## 2017-10-19 MED ORDER — EPHEDRINE SULFATE-NACL 50-0.9 MG/10ML-% IV SOSY
PREFILLED_SYRINGE | INTRAVENOUS | Status: DC | PRN
  Administered 2017-10-19: 15 mg via INTRAVENOUS
  Administered 2017-10-19: 10 mg via INTRAVENOUS
  Administered 2017-10-19: 5 mg via INTRAVENOUS

## 2017-10-19 MED ORDER — ASPIRIN EC 325 MG PO TBEC: 325.0000 mg | DELAYED_RELEASE_TABLET | Freq: Every day | ORAL | 0 refills | Status: DC

## 2017-10-19 SURGICAL SUPPLY — 56 items
BANDAGE ACE 6X5 VEL STRL LF (GAUZE/BANDAGES/DRESSINGS) ×3 IMPLANT
BANDAGE ESMARK 6X9 LF (GAUZE/BANDAGES/DRESSINGS) ×1 IMPLANT
BASEPLATE TIBIAL TRIATH (Orthopedic Implant) ×3 IMPLANT
BLADE SAG 18X100X1.27 (BLADE) ×6 IMPLANT
BNDG ELASTIC 6X10 VLCR STRL LF (GAUZE/BANDAGES/DRESSINGS) ×3 IMPLANT
BNDG ELASTIC 6X15 VLCR STRL LF (GAUZE/BANDAGES/DRESSINGS) ×3 IMPLANT
BNDG ESMARK 6X9 LF (GAUZE/BANDAGES/DRESSINGS) ×3
BOWL SMART MIX CTS (DISPOSABLE) ×3 IMPLANT
CEMENT BONE SIMPLEX SPEEDSET (Cement) ×6 IMPLANT
CLOSURE STERI-STRIP 1/2X4 (GAUZE/BANDAGES/DRESSINGS) ×2
CLOSURE STERI-STRIP 1/4X4 (GAUZE/BANDAGES/DRESSINGS) ×3 IMPLANT
CLSR STERI-STRIP ANTIMIC 1/2X4 (GAUZE/BANDAGES/DRESSINGS) ×4 IMPLANT
COVER SURGICAL LIGHT HANDLE (MISCELLANEOUS) ×3 IMPLANT
CUFF TOURNIQUET SINGLE 34IN LL (TOURNIQUET CUFF) ×3 IMPLANT
DRAPE EXTREMITY T 121X128X90 (DRAPE) ×3 IMPLANT
DRAPE HALF SHEET 40X57 (DRAPES) ×3 IMPLANT
DRAPE U-SHAPE 47X51 STRL (DRAPES) ×3 IMPLANT
DRSG MEPILEX BORDER 4X12 (GAUZE/BANDAGES/DRESSINGS) ×3 IMPLANT
DRSG MEPILEX BORDER 4X8 (GAUZE/BANDAGES/DRESSINGS) ×3 IMPLANT
DURAPREP 26ML APPLICATOR (WOUND CARE) ×6 IMPLANT
ELECT CAUTERY BLADE 6.4 (BLADE) ×3 IMPLANT
ELECT REM PT RETURN 9FT ADLT (ELECTROSURGICAL) ×3
ELECTRODE REM PT RTRN 9FT ADLT (ELECTROSURGICAL) ×1 IMPLANT
FACESHIELD WRAPAROUND (MASK) ×6 IMPLANT
GLOVE BIO SURGEON STRL SZ7.5 (GLOVE) ×6 IMPLANT
GLOVE BIOGEL PI IND STRL 8 (GLOVE) ×2 IMPLANT
GLOVE BIOGEL PI INDICATOR 8 (GLOVE) ×4
GOWN STRL REUS W/ TWL LRG LVL3 (GOWN DISPOSABLE) ×2 IMPLANT
GOWN STRL REUS W/ TWL XL LVL3 (GOWN DISPOSABLE) ×1 IMPLANT
GOWN STRL REUS W/TWL LRG LVL3 (GOWN DISPOSABLE) ×4
GOWN STRL REUS W/TWL XL LVL3 (GOWN DISPOSABLE) ×2
HANDPIECE INTERPULSE COAX TIP (DISPOSABLE)
IMMOBILIZER KNEE 22 UNIV (SOFTGOODS) ×3 IMPLANT
INSERT TIBIAL BEARING 11MM SZ4 (Knees) ×3 IMPLANT
KIT BASIN OR (CUSTOM PROCEDURE TRAY) ×3 IMPLANT
KIT ROOM TURNOVER OR (KITS) ×3 IMPLANT
MANIFOLD NEPTUNE II (INSTRUMENTS) ×3 IMPLANT
NEEDLE 22X1 1/2 (OR ONLY) (NEEDLE) ×3 IMPLANT
NS IRRIG 1000ML POUR BTL (IV SOLUTION) ×3 IMPLANT
PACK TOTAL JOINT (CUSTOM PROCEDURE TRAY) ×3 IMPLANT
PAD ARMBOARD 7.5X6 YLW CONV (MISCELLANEOUS) ×3 IMPLANT
SET HNDPC FAN SPRY TIP SCT (DISPOSABLE) IMPLANT
STEM FLTD 13X150 (Stem) ×3 IMPLANT
SUCTION FRAZIER HANDLE 10FR (MISCELLANEOUS) ×2
SUCTION TUBE FRAZIER 10FR DISP (MISCELLANEOUS) ×1 IMPLANT
SUT MNCRL AB 4-0 PS2 18 (SUTURE) ×3 IMPLANT
SUT MON AB 2-0 CT1 27 (SUTURE) ×3 IMPLANT
SUT VIC AB 0 CT1 27 (SUTURE) ×2
SUT VIC AB 0 CT1 27XBRD ANBCTR (SUTURE) ×1 IMPLANT
SUT VIC AB 1 CT1 27 (SUTURE) ×4
SUT VIC AB 1 CT1 27XBRD ANBCTR (SUTURE) ×2 IMPLANT
SYR 50ML LL SCALE MARK (SYRINGE) ×3 IMPLANT
TOWEL OR 17X24 6PK STRL BLUE (TOWEL DISPOSABLE) ×3 IMPLANT
TOWEL OR 17X26 10 PK STRL BLUE (TOWEL DISPOSABLE) ×3 IMPLANT
TRAY CATH 16FR W/PLASTIC CATH (SET/KITS/TRAYS/PACK) IMPLANT
TRAY FOLEY BAG SILVER LF 14FR (CATHETERS) IMPLANT

## 2017-10-19 NOTE — Progress Notes (Signed)
Placed on bed hold - no bed available

## 2017-10-19 NOTE — Anesthesia Procedure Notes (Signed)
Spinal  Patient location during procedure: OR Start time: 10/19/2017 2:08 PM End time: 10/19/2017 2:12 PM Staffing Anesthesiologist: Audry Pili, MD Performed: anesthesiologist  Preanesthetic Checklist Completed: patient identified, surgical consent, pre-op evaluation, timeout performed, IV checked, risks and benefits discussed and monitors and equipment checked Spinal Block Patient position: sitting Prep: DuraPrep Patient monitoring: heart rate, cardiac monitor, continuous pulse ox and blood pressure Approach: midline Location: L3-4 Injection technique: single-shot Needle Needle type: Pencan  Needle gauge: 24 G Additional Notes Functioning IV was confirmed and monitors were applied. Sterile prep and drape, including hand hygiene, mask, and sterile gloves were used. The patient was positioned and the spine was prepped. The skin was anesthetized with lidocaine. Free flow of clear CSF was obtained prior to injecting local anesthetic into the CSF. The spinal needle aspirated freely following injection. The needle was carefully withdrawn. The patient tolerated the procedure well. Consent was obtained prior to the procedure with all questions answered and concerns addressed. Risks including, but not limited to, bleeding, infection, nerve damage, paralysis, failed block, inadequate analgesia, allergic reaction, high spinal, itching, and headache were discussed and the patient wished to proceed.  Renold Don, MD

## 2017-10-19 NOTE — Progress Notes (Signed)
Attempted to call report - no nurse available or bed assigned at this time

## 2017-10-19 NOTE — Transfer of Care (Signed)
Immediate Anesthesia Transfer of Care Note  Patient: Leah Jensen  Procedure(s) Performed: TOTAL KNEE ARTHROPLASTY WITH REVISION COMPONENTS (Right )  Patient Location: PACU  Anesthesia Type:Spinal and GA combined with regional for post-op pain  Level of Consciousness: awake, alert  and oriented  Airway & Oxygen Therapy: Patient Spontanous Breathing  Post-op Assessment: Report given to RN and Post -op Vital signs reviewed and stable  Post vital signs: Reviewed and stable  Last Vitals:  Vitals:   10/19/17 1350 10/19/17 1620  BP: 106/67   Pulse: (!) 56 75  Resp: 14 10  Temp:  (!) 36.4 C  SpO2: 100% 100%    Last Pain:  Vitals:   10/19/17 1206  TempSrc: Oral      Patients Stated Pain Goal: 3 (94/49/[id number])  Complications: No apparent anesthesia complications

## 2017-10-19 NOTE — Progress Notes (Signed)
Orthopedic Tech Progress Note Patient Details:  Leah Jensen 28-May-1946 [id number]  CPM Right Knee CPM Right Knee: On Right Knee Flexion (Degrees): 90 Right Knee Extension (Degrees): 0 Additional Comments: Foot roll  Post Interventions Patient Tolerated: Well Instructions Provided: Care of device, Adjustment of device  [place] Pink 10/19/2017, 4:59 PM

## 2017-10-19 NOTE — Anesthesia Procedure Notes (Signed)
Anesthesia Regional Block: Adductor canal block   Pre-Anesthetic Checklist: ,, timeout performed, Correct Patient, Correct Site, Correct Laterality, Correct Procedure, Correct Position, site marked, Risks and benefits discussed,  Surgical consent,  Pre-op evaluation,  At surgeon's request and post-op pain management  Laterality: Right  Prep: chloraprep       Needles:  Injection technique: Single-shot  Needle Type: Echogenic Needle     Needle Length: 9cm  Needle Gauge: 21     Additional Needles:   Narrative:  Start time: 10/19/2017 1:38 PM End time: 10/19/2017 1:41 PM Injection made incrementally with aspirations every 5 mL.  Performed by: Personally  Anesthesiologist: Audry Pili, MD  Additional Notes: No pain on injection. No increased resistance to injection. Injection made in 5cc increments. Good needle visualization. Patient tolerated the procedure well.

## 2017-10-19 NOTE — Progress Notes (Signed)
Previous note at 0730 should be corrected to 1930

## 2017-10-19 NOTE — Progress Notes (Signed)
Attempted report - nurse unavailable

## 2017-10-19 NOTE — Discharge Instructions (Signed)

## 2017-10-19 NOTE — Anesthesia Preprocedure Evaluation (Addendum)
Anesthesia Evaluation  Patient identified by MRN, date of birth, ID band Patient awake    Reviewed: Allergy & Precautions, NPO status , Patient's Chart, lab work & pertinent test results  Airway Mallampati: I  TM Distance: >3 FB Neck ROM: Full    Dental  (+) Dental Advisory Given, Teeth Intact   Pulmonary neg pulmonary ROS,    Pulmonary exam normal breath sounds clear to auscultation       Cardiovascular negative cardio ROS Normal cardiovascular exam Rhythm:Regular Rate:Normal     Neuro/Psych Anxiety Depression Bipolar Disorder Essential tremor    GI/Hepatic negative GI ROS, Neg liver ROS,   Endo/Other  negative endocrine ROS  Renal/GU negative Renal ROS  negative genitourinary   Musculoskeletal  (+) Arthritis , Chronic low back pain   Abdominal   Peds  Hematology negative hematology ROS (+)   Anesthesia Other Findings   Reproductive/Obstetrics                            Anesthesia Physical  Anesthesia Plan  ASA: II  Anesthesia Plan: Spinal   Post-op Pain Management:  Regional for Post-op pain   Induction: Intravenous  PONV Risk Score and Plan: Propofol infusion and Treatment may vary due to age or medical condition  Airway Management Planned: Natural Airway and Simple Face Mask  Additional Equipment: None  Intra-op Plan:   Post-operative Plan:   Informed Consent: I have reviewed the patients History and Physical, chart, labs and discussed the procedure including the risks, benefits and alternatives for the proposed anesthesia with the patient or authorized representative who has indicated his/her understanding and acceptance.     Plan Discussed with: CRNA  Anesthesia Plan Comments:        Anesthesia Quick Evaluation

## 2017-10-19 NOTE — Op Note (Signed)
10/19/2017  3:21 PM  PATIENT:  Leah Jensen    PRE-OPERATIVE DIAGNOSIS:  FAILURE RIGHT TOTAL KNEE  POST-OPERATIVE DIAGNOSIS:  Same  PROCEDURE:  TOTAL KNEE ARTHROPLASTY WITH REVISION COMPONENTS  SURGEON:  Gennavieve Huq, Ernesta Amble, MD  ASSISTANT:  Frederik Pear MD  Roxan Hockey, PA-C, he was present and scrubbed throughout the case, critical for completion in a timely fashion, and for retraction, instrumentation, and closure.   ANESTHESIA:   Spinal  PREOPERATIVE INDICATIONS:  Leah Jensen is a  72 y.o. female with a diagnosis of FAILURE RIGHT TOTAL KNEE who failed conservative measures and elected for surgical management.    The risks benefits and alternatives were discussed with the patient preoperatively including but not limited to the risks of infection, bleeding, nerve injury, cardiopulmonary complications, the need for revision surgery, among others, and the patient was willing to proceed.  OPERATIVE IMPLANTS: Stryker Tibial Stemmed implant 150 x 13 stem with a  4 tibial tray and 11 poly liner  OPERATIVE FINDINGS: stable patella and femoral implant  BLOOD LOSS: min  COMPLICATIONS: none  TOURNIQUET TIME: 64min  OPERATIVE PROCEDURE:  Patient was identified in the preoperative holding area and site was marked by me She was transported to the operating theater and placed on the table in supine position taking care to pad all bony prominences. After a preincinduction time out anesthesia was induced. The right lower extremity was prepped and draped in normal sterile fashion and a pre-incision timeout was performed. She received ancef and TXA for preoperative antibiotics.   I made an anterior incision through her previous incision.  I carried this down distally for further development of medial approach to deliver the tibial tray.  I next made a medial parapatellar arthrotomy.  I elevated the medial capsule off the tibia to allow rotational dislocation without having to remove  the femoral component.  Next I assess the patella and femoral component they were stable.  Next I tested the tibial component overall it was stable however there was insufficient bone on the anterior medial aspect reports bone and thumb signs of insufficiency fracture here.  I used a osteotome and 10 mm saw to mobilize the tibial component and remove this I then removed the cement from around this as well.  I cleaned up all cement and devitalized tissue from around the tibia.  Next I saw trialed and selected a size 4 tray I pinned this into place and punch the keel cuts.  I then selected 150 stem and reamed up to a 13 width.  It was very central and I was happy with this.  I trialed and selected an 11 poly-this fit well she had good extension and stability.  I then removed the trials performed a thorough irrigation of her knee.  I mixed cement and placed this into the softer bone in the anterior medial aspect and was sure to pressurize this well.  Next I implanted her implants of these in place while cement hardened placed her polyethylene liner reduced her knee tracked very well patella tracked well.  After thorough irrigation I closed her capsulotomy followed by layered skin closure.  Sterile dressings were applied she was awoken and taken to the PACU in stable condition  POST OPERATIVE PLAN: Mobilize and chemical dvt px.

## 2017-10-19 NOTE — Anesthesia Procedure Notes (Signed)
Procedure Name: MAC Date/Time: 10/19/2017 2:39 PM Performed by: Teressa Lower., CRNA Pre-anesthesia Checklist: Patient identified, Emergency Drugs available, Suction available and Patient being monitored Oxygen Delivery Method: Simple face mask

## 2017-10-20 ENCOUNTER — Encounter (HOSPITAL_COMMUNITY): Payer: Self-pay | Admitting: General Practice

## 2017-10-20 DIAGNOSIS — F319 Bipolar disorder, unspecified: Secondary | ICD-10-CM | POA: Diagnosis not present

## 2017-10-20 DIAGNOSIS — Z79890 Hormone replacement therapy (postmenopausal): Secondary | ICD-10-CM | POA: Diagnosis not present

## 2017-10-20 DIAGNOSIS — T84092A Other mechanical complication of internal right knee prosthesis, initial encounter: Secondary | ICD-10-CM | POA: Diagnosis not present

## 2017-10-20 DIAGNOSIS — M25561 Pain in right knee: Secondary | ICD-10-CM | POA: Diagnosis not present

## 2017-10-20 DIAGNOSIS — M810 Age-related osteoporosis without current pathological fracture: Secondary | ICD-10-CM | POA: Diagnosis not present

## 2017-10-20 DIAGNOSIS — G250 Essential tremor: Secondary | ICD-10-CM | POA: Diagnosis not present

## 2017-10-20 DIAGNOSIS — F419 Anxiety disorder, unspecified: Secondary | ICD-10-CM | POA: Diagnosis not present

## 2017-10-20 DIAGNOSIS — Z79899 Other long term (current) drug therapy: Secondary | ICD-10-CM | POA: Diagnosis not present

## 2017-10-20 MED ORDER — LACTATED RINGERS IV BOLUS (SEPSIS): 500.0000 mL | Freq: Once | INTRAVENOUS | Status: DC

## 2017-10-20 NOTE — Anesthesia Postprocedure Evaluation (Signed)
Anesthesia Post Note  Patient: Leah Jensen  Procedure(s) Performed: TOTAL KNEE ARTHROPLASTY WITH REVISION COMPONENTS (Right )     Patient location during evaluation: PACU Anesthesia Type: Spinal, MAC and Regional Level of consciousness: awake and alert Pain management: pain level controlled Vital Signs Assessment: post-procedure vital signs reviewed and stable Respiratory status: spontaneous breathing, nonlabored ventilation, respiratory function stable and patient connected to nasal cannula oxygen Cardiovascular status: stable Postop Assessment: no apparent nausea or vomiting and spinal receding Anesthetic complications: no    Last Vitals:  Vitals:   10/20/17 0159 10/20/17 0419  BP: (!) 77/50 (!) 81/48  Pulse: (!) 51 (!) 56  Resp: 14 16  Temp: (!) 36.3 C 36.4 C  SpO2: 100% 100%    Last Pain:  Vitals:   10/20/17 0419  TempSrc: Oral  PainSc:                  Ambra Haverstick

## 2017-10-20 NOTE — Evaluation (Signed)
Occupational Therapy Evaluation Patient Details Name: Leah Jensen MRN: [id number] DOB: [birth year] Today's Date: 10/20/2017    History of Present Illness Pt is a 72 y/o F now s/p R TKA with revision components; PMHx includes anxiety, bipolar disorder, essential tremors    Clinical Impression   This 72 y/o F presents with the above. At baseline Pt is independent with ADLs and functional mobility. Pt currently requiring MinGuard assist for functional mobility at RW level, functional transfers, and overall MinGuard for ADL completion. Education provided on safety during ADL completion and functional transfers with Pt verbalizing understanding, questions answered throughout. Pt lives alone, will be returning home with daughter initially who Pt reports is able to provide supervision/assist PRN. Feel Pt will be safe to return home with available family assist once medically ready. No further acute OT needs identified at this time. Will sign off.     Follow Up Recommendations  Follow surgeon's recommendation for DC plan and follow-up therapies;Supervision/Assistance - 24 hour    Equipment Recommendations  Other (comment)(Pt declining 3:1/shower seat at this time )           Precautions / Restrictions Precautions Precautions: Knee Precaution Booklet Issued: No Precaution Comments: verbally reviewed  Restrictions Weight Bearing Restrictions: Yes RLE Weight Bearing: Weight bearing as tolerated      Mobility Bed Mobility Overal bed mobility: Needs Assistance Bed Mobility: Supine to Sit     Supine to sit: Supervision     General bed mobility comments: supervision for safety   Transfers Overall transfer level: Needs assistance Equipment used: Rolling walker (2 wheeled) Transfers: Sit to/from Stand Sit to Stand: Min guard         General transfer comment: MinGuard for safety, Pt demonstrating safe hand placement     Balance Overall balance assessment: Needs  assistance Sitting-balance support: Feet supported Sitting balance-Leahy Scale: Good     Standing balance support: During functional activity;Bilateral upper extremity supported Standing balance-Leahy Scale: Fair Standing balance comment: pt able to maintain static standing without UE support with minguard for safety                            ADL either performed or assessed with clinical judgement   ADL Overall ADL's : Needs assistance/impaired Eating/Feeding: Modified independent;Sitting   Grooming: Wash/dry hands;Oral care;Min guard;Standing   Upper Body Bathing: Min guard;Sitting   Lower Body Bathing: Min guard;Sit to/from stand   Upper Body Dressing : Sitting;Set up   Lower Body Dressing: Min guard;Sit to/from stand Lower Body Dressing Details (indicate cue type and reason): Pt easily able to reach R foot to adjust sock on RLE  Toilet Transfer: Min guard;Ambulation;Regular Toilet;RW   Toileting- Water quality scientist and Hygiene: Min guard;Sit to/from stand   Tub/ Shower Transfer: Walk-in shower;Min guard;Cueing for sequencing;Ambulation;Rolling walker Tub/Shower Transfer Details (indicate cue type and reason): Pt initially stating she did not need to use 3:1/shower seat for task completion/declining needing one, eduation provided on importance of utilizing shower seat for increased safety/decresed fall risk during task; recommend Pt utilize shower seat or have daughter provide direct supervision for increased safety if preferring to stand with Pt verbalizing understanding, reports she will discuss options with her daughter  Functional mobility during ADLs: Min guard;Rolling walker General ADL Comments: education provided on safety and compensatory techniques for completing ADLs and functional transfers  Pertinent Vitals/Pain Pain Assessment: 0-10 Pain Score: 4  Pain Descriptors / Indicators: Sore;Discomfort Pain Intervention(s):  Limited activity within patient's tolerance;Monitored during session;Repositioned          Extremity/Trunk Assessment Upper Extremity Assessment Upper Extremity Assessment: Overall WFL for tasks assessed   Lower Extremity Assessment Lower Extremity Assessment: Defer to PT evaluation       Communication Communication Communication: No difficulties   Cognition Arousal/Alertness: Awake/alert Behavior During Therapy: WFL for tasks assessed/performed Overall Cognitive Status: Within Functional Limits for tasks assessed                                                      Home Living Family/patient expects to be discharged to:: Private residence Living Arrangements: Alone Available Help at Discharge: Family;Available 24 hours/day(staying with daughter initially ) Type of Home: House Home Access: Stairs to enter [employer] of Steps: 2   Home Layout: One level     Bathroom Shower/Tub: Tub/shower unit;Walk-in shower         Home Equipment: Walker - 2 wheels          Prior Functioning/Environment Level of Independence: Independent                 OT Problem List: Decreased strength;Decreased range of motion;Decreased activity tolerance;Pain            OT Goals(Current goals can be found in the care plan section) Acute Rehab OT Goals Patient Stated Goal: return home  OT Goal Formulation: All assessment and education complete, DC therapy                                 AM-PAC PT "6 Clicks" Daily Activity     Outcome Measure Help from another person eating meals?: None Help from another person taking care of personal grooming?: A Little Help from another person toileting, which includes using toliet, bedpan, or urinal?: A Little Help from another person bathing (including washing, rinsing, drying)?: A Little Help from another person to put on and taking off regular upper body clothing?: None Help from another  person to put on and taking off regular lower body clothing?: A Little 6 Click Score: 20   End of Session Equipment Utilized During Treatment: Gait belt;Rolling walker CPM Right Knee CPM Right Knee: Off Right Knee Flexion (Degrees): 60 Right Knee Extension (Degrees): 0 Nurse Communication: Mobility status  Activity Tolerance: Patient tolerated treatment well Patient left: Other (comment);with nursing/sitter in room(sitting EOB, hand off to PT )  OT Visit Diagnosis: Other abnormalities of gait and mobility (R26.89)                Time: 5009-3818 OT Time Calculation (min): 36 min Charges:  OT General Charges $OT Visit: 1 Visit OT Evaluation $OT Eval Low Complexity: 1 Low OT Treatments $Self Care/Home Management : 8-22 mins G-Codes:     Lou Cal, OT Pager [phone] 10/20/2017   Raymondo Band 10/20/2017, 9:20 AM

## 2017-10-20 NOTE — Progress Notes (Signed)
Physical Therapy Treatment Patient Details Name: Leah Jensen MRN: [id number] DOB: 12-Dec-1945 Today's Date: 10/20/2017    History of Present Illness Pt is a 72 y/o F now s/p R TKA with revision components; PMHx includes anxiety, bipolar disorder, essential tremors     PT Comments    Patient with limited session due to prep for d/c home this pm.  Able to demonstrate safe technique with up to bathroom with walker.  Agree with home with family support.    Follow Up Recommendations  Supervision/Assistance - 24 hour;Follow surgeon's recommendation for DC plan and follow-up therapies     Equipment Recommendations  None recommended by PT    Recommendations for Other Services       Precautions / Restrictions Precautions Precautions: Knee Precaution Comments: verbally reviewed  Restrictions RLE Weight Bearing: Weight bearing as tolerated    Mobility  Bed Mobility Overal bed mobility: Needs Assistance       Supine to sit: Supervision     General bed mobility comments: supervision for safety   Transfers Overall transfer level: Needs assistance Equipment used: Rolling walker (2 wheeled) Transfers: Sit to/from Stand Sit to Stand: Supervision         General transfer comment: S for safety with good technique  Ambulation/Gait Ambulation/Gait assistance: Min guard Ambulation Distance (Feet): 20 Feet Assistive device: Rolling walker (2 wheeled) Gait Pattern/deviations: Step-through pattern;Decreased stride length;Trunk flexed     General Gait Details: to bathroom and back to bed, pt in prep for d/c home   Stairs    Wheelchair Mobility    Modified Rankin (Stroke Patients Only)       Balance Overall balance assessment: Needs assistance Sitting-balance support: Feet supported Sitting balance-Leahy Scale: Good     Standing balance support: During functional activity;Bilateral upper extremity supported Standing balance-Leahy Scale: Fair Standing balance  comment: washed hands at sink                            Cognition Arousal/Alertness: Awake/alert Behavior During Therapy: WFL for tasks assessed/performed Overall Cognitive Status: Within Functional Limits for tasks assessed                                        Exercises     General Comments        Pertinent Vitals/Pain Pain Assessment: Faces Pain Score: 5  Faces Pain Scale: Hurts little more Pain Location: R Knee Pain Descriptors / Indicators: Operative site guarding;Sore Pain Intervention(s): Monitored during session;Repositioned    Home Living Family/patient expects to be discharged to:: Private residence Living Arrangements: Alone Available Help at Discharge: Family;Available 24 hours/day(will stay with daughter initially) Type of Home: House Home Access: Stairs to enter Entrance Stairs-Rails: Right Home Layout: One level Home Equipment: Environmental consultant - 2 wheels      Prior Function Level of Independence: Independent      Comments: works in a [employer]   PT Goals (current goals can now be found in the care plan section) Acute Rehab PT Goals Patient Stated Goal: return home  PT Goal Formulation: With patient Time For Goal Achievement: 10/22/17 Potential to Achieve Goals: Good Progress towards PT goals: Progressing toward goals    Frequency    7X/week      PT Plan Current plan remains appropriate    Co-evaluation  AM-PAC PT "6 Clicks" Daily Activity  Outcome Measure  Difficulty turning over in bed (including adjusting bedclothes, sheets and blankets)?: A Little Difficulty moving from lying on back to sitting on the side of the bed? : A Little Difficulty sitting down on and standing up from a chair with arms (e.g., wheelchair, bedside commode, etc,.)?: A Little Help needed moving to and from a bed to chair (including a wheelchair)?: A Little Help needed walking in hospital room?: A Little Help needed  climbing 3-5 steps with a railing? : A Little 6 Click Score: 18    End of Session Equipment Utilized During Treatment: Gait belt Activity Tolerance: Patient tolerated treatment well Patient left: in bed;with call bell/phone within reach   PT Visit Diagnosis: Difficulty in walking, not elsewhere classified (R26.2)     Time: 3838-1840 PT Time Calculation (min) (ACUTE ONLY): 11 min  Charges:   $Therapeutic Activity: 8-22 mins                    G CodesMagda Kiel, [place] [phone] 10/20/2017    Reginia Naas 10/20/2017, 4:12 PM

## 2017-10-20 NOTE — Plan of Care (Signed)
  Completed/Met Education: Knowledge of General Education information will improve 10/20/2017 1612 - Completed/Met by Emmaline Life, RN Health Behavior/Discharge Planning: Ability to manage health-related needs will improve 10/20/2017 1612 - Completed/Met by Emmaline Life, RN Clinical Measurements: Ability to maintain clinical measurements within normal limits will improve 10/20/2017 1612 - Completed/Met by Emmaline Life, RN Will remain free from infection 10/20/2017 1612 - Completed/Met by Emmaline Life, RN Diagnostic test results will improve 10/20/2017 1612 - Completed/Met by Emmaline Life, RN Respiratory complications will improve 10/20/2017 1612 - Completed/Met by Emmaline Life, RN Cardiovascular complication will be avoided 10/20/2017 1612 - Completed/Met by Emmaline Life, RN Activity: Risk for activity intolerance will decrease 10/20/2017 1612 - Completed/Met by Emmaline Life, RN Coping: Level of anxiety will decrease 10/20/2017 1612 - Completed/Met by Emmaline Life, RN Elimination: Will not experience complications related to bowel motility 10/20/2017 1612 - Completed/Met by Emmaline Life, RN Will not experience complications related to urinary retention 10/20/2017 1612 - Completed/Met by Emmaline Life, RN Pain Managment: General experience of comfort will improve 10/20/2017 1612 - Completed/Met by Emmaline Life, RN Skin Integrity: Risk for impaired skin integrity will decrease 10/20/2017 1612 - Completed/Met by Emmaline Life, RN

## 2017-10-20 NOTE — Evaluation (Signed)
Physical Therapy Evaluation Patient Details Name: Leah Jensen MRN: [id number] DOB: 04/22/1946 Today's Date: 10/20/2017   History of Present Illness  Pt is a 72 y/o F now s/p R TKA with revision components; PMHx includes anxiety, bipolar disorder, essential tremors   Clinical Impression  Patient presents with decreased independence with mobility due to deficits listed in PT problem list.  Have reviewed all information and practiced HEP, stairs and ambulation.  Will benefit from follow up PT at d/c.  Will see once more prior to d/c.     Follow Up Recommendations Supervision/Assistance - 24 hour;Follow surgeon's recommendation for DC plan and follow-up therapies    Equipment Recommendations  None recommended by PT    Recommendations for Other Services       Precautions / Restrictions Precautions Precautions: Knee Precaution Booklet Issued: No Precaution Comments: verbally reviewed  Restrictions Weight Bearing Restrictions: Yes RLE Weight Bearing: Weight bearing as tolerated      Mobility  Bed Mobility Overal bed mobility: Needs Assistance Bed Mobility: Supine to Sit     Supine to sit: Supervision     General bed mobility comments: supervision for safety   Transfers Overall transfer level: Needs assistance Equipment used: Rolling walker (2 wheeled) Transfers: Sit to/from Stand Sit to Stand: Supervision         General transfer comment: S for safety with good technique  Ambulation/Gait Ambulation/Gait assistance: Min guard Ambulation Distance (Feet): 400 Feet Assistive device: Rolling walker (2 wheeled) Gait Pattern/deviations: Step-through pattern;Decreased stride length;Trunk flexed     General Gait Details: good step through pattern with heel strike and toe off, initially flexed, then fixed with cues, minguard to S for safety  Stairs Stairs: Yes Stairs assistance: Min guard Stair Management: Step to pattern;Sideways;One rail Right Number of Stairs:  2 General stair comments: demonstrated technique, assist for safety  Wheelchair Mobility    Modified Rankin (Stroke Patients Only)       Balance Overall balance assessment: Needs assistance Sitting-balance support: Feet supported Sitting balance-Leahy Scale: Good     Standing balance support: During functional activity;Bilateral upper extremity supported Standing balance-Leahy Scale: Fair Standing balance comment: pt able to maintain static standing without UE support with minguard for safety                              Pertinent Vitals/Pain Pain Assessment: 0-10 Pain Score: 5  Pain Location: R Knee Pain Descriptors / Indicators: Operative site guarding;Sore Pain Intervention(s): Monitored during session;Repositioned    Home Living Family/patient expects to be discharged to:: Private residence Living Arrangements: Alone Available Help at Discharge: Family;Available 24 hours/day(will stay with daughter initially) Type of Home: House Home Access: Stairs to enter Entrance Stairs-Rails: Right Entrance Stairs-Number of Steps: 2 Home Layout: One level Home Equipment: Walker - 2 wheels      Prior Function Level of Independence: Independent         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        Extremity/Trunk Assessment   Upper Extremity Assessment Upper Extremity Assessment: Defer to OT evaluation    Lower Extremity Assessment Lower Extremity Assessment: RLE deficits/detail RLE Deficits / Details: AROM knee flexion 87, extension -5; strength at least 3/5; 5 degree quad lag       Communication   Communication: No difficulties  Cognition Arousal/Alertness: Awake/alert Behavior During Therapy: WFL for tasks assessed/performed Overall Cognitive Status: Within Functional Limits for  tasks assessed                                        General Comments      Exercises Total Joint Exercises Ankle Circles/Pumps:  AROM;15 reps;Both;Seated Quad Sets: 10 reps;Strengthening;Right;Seated Short Arc Quad: AROM;Right;10 reps;Seated Heel Slides: Right;Seated;AROM;AAROM;5 reps Hip ABduction/ADduction: AROM;Right;10 reps;Seated Straight Leg Raises: AROM;Right;10 reps;Seated Knee Flexion: AROM;10 reps;Right;Seated Goniometric ROM: -5 to 87   Assessment/Plan    PT Assessment Patient needs continued PT services  PT Problem List Decreased strength;Decreased range of motion;Decreased balance;Decreased knowledge of use of DME       PT Treatment Interventions DME instruction;Functional mobility training;Balance training;Patient/family education;Gait training;Stair training;Therapeutic exercise    PT Goals (Current goals can be found in the Care Plan section)  Acute Rehab PT Goals Patient Stated Goal: return home  PT Goal Formulation: With patient Time For Goal Achievement: 10/22/17 Potential to Achieve Goals: Good    Frequency 7X/week   Barriers to discharge        Co-evaluation               AM-PAC PT "6 Clicks" Daily Activity  Outcome Measure Difficulty turning over in bed (including adjusting bedclothes, sheets and blankets)?: A Little Difficulty moving from lying on back to sitting on the side of the bed? : A Little Difficulty sitting down on and standing up from a chair with arms (e.g., wheelchair, bedside commode, etc,.)?: A Little Help needed moving to and from a bed to chair (including a wheelchair)?: A Little Help needed walking in hospital room?: A Little Help needed climbing 3-5 steps with a railing? : A Little 6 Click Score: 18    End of Session Equipment Utilized During Treatment: Gait belt Activity Tolerance: Patient tolerated treatment well Patient left: with call bell/phone within reach;in chair   PT Visit Diagnosis: Difficulty in walking, not elsewhere classified (R26.2)    Time: 7654-6503 PT Time Calculation (min) (ACUTE ONLY): 43 min   Charges:   PT  Evaluation $PT Eval Low Complexity: 1 Low PT Treatments $Gait Training: 8-22 mins $Therapeutic Exercise: 8-22 mins   PT G CodesMagda Kiel, [place] [phone] 10/20/2017   Reginia Naas 10/20/2017, 12:45 PM

## 2017-10-20 NOTE — Discharge Summary (Signed)
Discharge Summary  Patient ID: Leah Jensen MRN: [id number] DOB/AGE: Jul 04, 1946 72 y.o.  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 ALLERGIC RHINITIS   . Anxiety   . Bipolar disorder ([place])   . Cataract    slight on lt. eye  . DEPRESSION/ANXIETY   . Low back pain   . Osteoarthritis    "probably qwhere/Dr. Percell Miller" (06/28/2015)  . 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 Palpitations   . TREMOR, ESSENTIAL     Surgeries: Procedure(s): TOTAL KNEE ARTHROPLASTY WITH REVISION COMPONENTS on 10/19/2017   Consultants (if any):   Discharged Condition: Improved  Hospital Course: Leah Jensen is an 72 y.o. 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    She was given perioperative antibiotics:  Anti-infectives (From admission, onward)   Start     Dose/Rate Route Frequency Ordered Stop   10/19/17 1700  ceFAZolin (ANCEF) IVPB 1 g/50 mL premix     1 g 100 mL/hr over 30 Minutes Intravenous Every 6 hours 10/19/17 1649 10/20/17 0459   10/19/17 1208  ceFAZolin (ANCEF) 2-4 GM/100ML-% IVPB    Comments:  Nyoka Cowden   : cabinet override      10/19/17 1208 10/20/17 0014   10/19/17 1153  ceFAZolin (ANCEF) IVPB 2g/100 mL premix     2 g 200 mL/hr over 30 Minutes Intravenous On call to O.R. 10/19/17 1153 10/19/17 1416    .  She was given sequential compression devices, early ambulation, and Aspirin for DVT prophylaxis.  She  benefited maximally from the hospital stay and there were no complications.    Recent vital signs:  Vitals:   10/20/17 0159 10/20/17 0419  BP: (!) 77/50 (!) 81/48  Pulse: (!) 51 (!) 56  Resp: 14 16  Temp: (!) 97.4 F (36.3 C) 97.6 F (36.4 C)  SpO2: 100% 100%    Recent laboratory studies:  Lab Results  Component Value Date   HGB 14.0 10/05/2017   HGB 13.6 11/27/2016   HGB 13.7 11/22/2015   Lab Results  Component Value Date   WBC 5.0 10/05/2017   PLT 253 10/05/2017   Lab Results  Component Value Date   INR 0.96 06/15/2015   Lab Results  Component Value Date   NA 138 10/05/2017   K 4.0 10/05/2017   CL 103 10/05/2017   CO2 23 10/05/2017   BUN 19 10/05/2017   CREATININE 0.74 10/05/2017   GLUCOSE 94 10/05/2017    Discharge Medications:   Allergies as of 10/20/2017      Reactions   Amoxicillin Hives   Has patient had a PCN reaction causing immediate rash, facial/tongue/throat swelling, SOB or lightheadedness with hypotension: No Has patient had a PCN reaction causing severe rash involving mucus membranes or skin necrosis: No Has patient had a PCN reaction that required hospitalization: No Has patient had a PCN reaction occurring within the last 10 years: Unknown If all of the above answers are "NO", then may proceed with  Cephalosporin use.   Penicillins Rash   Has patient had a PCN reaction causing immediate rash, facial/tongue/throat swelling, SOB or lightheadedness with hypotension: No Has patient had a PCN reaction causing severe rash involving mucus membranes or skin necrosis: No Has patient had a PCN reaction that required hospitalization: No Has patient had a PCN reaction occurring within the last 10 years: Unknown If all of the above answers are "NO", then may proceed with Cephalosporin use.      Medication List    TAKE these medications   acetaminophen 500 MG tablet Commonly known as:  TYLENOL Take 2 tablets (1,000 mg total) by mouth every 8 (eight)  hours for 14 days. For Pain. What changed:    when to take this  reasons to take this  additional instructions   aspirin EC 325 MG tablet Take 1 tablet (325 mg total) by mouth daily. For 30 days post op for DVT Prophylaxis   b complex vitamins tablet Take 1 tablet by mouth daily.   baclofen 10 MG tablet Commonly known as:  LIORESAL Take 1 tablet (10 mg total) by mouth 3 (three) times daily as needed for muscle spasms.   CALCIUM CITRATE + D PO Take 1 tablet by mouth 2 (two) times daily.   celecoxib 200 MG capsule Commonly known as:  CELEBREX Take 1 capsule (200 mg total) by mouth 2 (two) times daily for 14 days. For 2 weeks post op for pain and inflammation.  Discontinue Ibuprofen or other Anti-inflammatory medicine when taking this medicine.   clonazePAM 0.5 MG tablet Commonly known as:  KLONOPIN Take 0.5-1 tablets (0.25-0.5 mg total) by mouth 3 (three) times daily as needed for anxiety. Take 1/2 tab twice daily if needed and 1 tab at bedtime as needed What changed:    reasons to take this  additional instructions   desvenlafaxine 50 MG 24 hr tablet Commonly known as:  PRISTIQ Take 1 tablet (50 mg total) by mouth at bedtime.   docusate sodium 100 MG capsule Commonly known as:  COLACE Take 1 capsule (100 mg total) by mouth 2 (two) times daily. To prevent constipation while taking pain medication.   estradiol 0.025 mg/24hr patch Commonly known as:  CLIMARA - Dosed in mg/24 hr Place 0.025 mg onto the skin 6 days. In the evening.   lamoTRIgine 25 MG tablet Commonly known as:  LAMICTAL Take 1 tablet (25 mg total) by mouth at bedtime.   multivitamin with minerals Tabs tablet Take 1 tablet by mouth daily.   omeprazole 20 MG capsule Commonly known as:  PRILOSEC Take 1 capsule (20 mg total) by mouth daily. 30 days for gastroprotection while taking NSAIDs.   ondansetron 4 MG tablet Commonly known as:  ZOFRAN Take 1 tablet (4 mg total) by mouth every 8 (eight) hours  as needed for nausea or vomiting.   progesterone 100 MG capsule Commonly known as:  PROMETRIUM Take 100 mg by mouth every other day. In the evening.   propranolol 20 MG tablet Commonly known as:  INDERAL Take 10-20 mg by mouth 3 (three) times daily as needed (for tremor (take 1 tablet scheduled in the morning)).   THERATEARS 0.25 % Soln Generic drug:  Carboxymethylcellulose Sodium Place 1-2 drops into both eyes 4 (four) times daily as needed (for dry eyes ( TheraTears)).   traMADol 50 MG tablet Commonly known as:  ULTRAM Take 1-2 tablets (50-100 mg total) by mouth every 6 (six) hours as needed for moderate pain.   Vitamin  D3 1000 units Caps Take 1,000 Units by mouth 2 (two) times daily.       Diagnostic Studies: Dg Knee Right Port  Result Date: 10/19/2017 CLINICAL DATA:  72 year old female with right knee prosthesis revision. Initial encounter. EXAM: PORTABLE RIGHT KNEE - 1-2 VIEW COMPARISON:  06/27/2015. FINDINGS: Revision right knee prosthesis with long stem component within the tibia. This appears in satisfactory position without complication noted. Lateral view just includes the inferior aspect of the prosthesis. IMPRESSION: Post right knee prosthesis revision without complication noted. Electronically Signed   By: Genia Del M.D.   On: 10/19/2017 17:25    Disposition: 06-Home-Health Care Svc  Discharge Instructions    Discharge patient   Complete by:  As directed    Late afternoon / early evening.  Discharging to daughter's house. After therapy sessions.   Discharge disposition:  01-Home or Self Care   Discharge patient date:  10/20/2017      Follow-up Information    Renette Butters, MD Follow up.   Specialty:  Orthopedic Surgery Contact information: [address] [place] [address] [phone]            Signed: Prudencio Burly III PA-C 10/20/2017, 7:11 AM

## 2017-10-20 NOTE — Progress Notes (Signed)
Discharge instructions, RX's and follow up appts explained and provided to patient, patient verbalized understanding. Patient left floor via wheelchair accompanied by staff no c/o pain or shortness of breath at discharge.  Chanon Loney, Tivis Ringer, RN

## 2017-10-20 NOTE — Progress Notes (Addendum)
    Subjective: Patient reports pain as mild to moderate.  Tolerating diet.  Urinating.  +Flatus.  No CP, SOB.  OOB times in room-denies lightheadedness, dizziness, weakness or other orthostatic symptoms.  Patient hopes to discharge her daughter's house at the end of the day.  Objective:   VITALS:   Vitals:   10/19/17 2049 10/19/17 2102 10/20/17 0159 10/20/17 0419  BP:  (!) 110/52 (!) 77/50 (!) 81/48  Pulse:  (!) 49 (!) 51 (!) 56  Resp:  12 14 16   Temp:  97.9 F (36.6 C) (!) 97.4 F (36.3 C) 97.6 F (36.4 C)  TempSrc:  Oral Oral Oral  SpO2: 99% 100% 100% 100%  Weight:      Height:       CBC Latest Ref Rng & Units 10/05/2017 11/27/2016 11/22/2015  WBC 4.0 - 10.5 K/uL 5.0 4.1 5.2  Hemoglobin 12.0 - 15.0 g/dL 14.0 13.6 13.7  Hematocrit 36.0 - 46.0 % 43.0 41.2 40.7  Platelets 150 - 400 K/uL 253 263.0 281.0   BMP Latest Ref Rng & Units 10/05/2017 11/27/2016 11/22/2015  Glucose 65 - 99 mg/dL 94 88 92  BUN 6 - 20 mg/dL 19 13 8   Creatinine 0.44 - 1.00 mg/dL 0.74 0.64 0.83  Sodium 135 - 145 mmol/L 138 137 139  Potassium 3.5 - 5.1 mmol/L 4.0 4.1 4.5  Chloride 101 - 111 mmol/L 103 105 103  CO2 22 - 32 mmol/L 23 26 28   Calcium 8.9 - 10.3 mg/dL 9.2 9.2 9.5   Intake/Output      02/18 0701 - 02/19 0700   P.O. 320   I.V. (mL/kg) 1000 (17.4)   Total Intake(mL/kg) 1320 (22.9)   Urine (mL/kg/hr) 400   Blood 100   Total Output 500   Net +820       Urine Occurrence 2 x   Stool Occurrence 2 x      Physical Exam: General: NAD.  Upright in bed.  Calm, pleasant, talkative.  No increased work of breathing  MSK Neurovascularly intact Sensation intact distally Feet warm Dorsiflexion/Plantar flexion intact Incision: dressing C/D/I   Assessment: 1 Day Post-Op  S/P Procedure(s) (LRB): TOTAL KNEE ARTHROPLASTY WITH REVISION COMPONENTS (Right) by Dr. Ernesta Amble. Murphy on 10/19/2017.  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 Discomfort controlled with Tylenol, Celebrex, Ultram. Eating, drinking, voiding. Mobilizing frequently/well in room. Hypotensive this morning-asymptomatic.  Patient reports low BP at baseline.  Plan: Will give LR bolus and follow BP/symptoms. Advance diet Up with therapy D/C IV fluids when BP stabilizes and  tolerating adequate p.o. Incentive Spirometry Elevate and Apply ice CPM, bone foam  Weight Bearing: Weight Bearing as Tolerated (WBAT)  Dressings: Mepilex.  Please apply a long-leg TED hose prior to discharge.  VTE prophylaxis: Aspirin, SCDs, ambulation  Dispo: Home late afternoon (to daughters house) after therapy evaluations.   Prudencio Burly III, PA-C 10/20/2017, 6:56 AM

## 2017-10-20 NOTE — Care Management Note (Signed)
Case Management Note  Patient Details  Name: [name] MRN: [id number] Date of Birth: Jul 16, 1946  Subjective/Objective:   72 yr old female s/p revision of right knee arthroplasty.                 Action/Plan: [facility] She will have family support at discharge.    Expected Discharge Date:  10/20/17               Expected Discharge Plan:  [name]  In-House Referral:  NA  Discharge planning Services  CM Consult  Post Acute Care Choice:  Home Health, Durable Medical Equipment Choice offered to:  Patient  DME Arranged:  CPM(has RW and 3in1) DME Agency:  [employer]  HH Arranged:  PT HH Agency:  [facility]  Status of Service:  Completed, signed off  If discussed at [place] of Stay Meetings, dates discussed:    Additional Comments:  Ninfa Meeker, RN 10/20/2017, 3:50 PM

## 2017-10-21 ENCOUNTER — Telehealth: Payer: Self-pay | Admitting: *Deleted

## 2017-10-21 ENCOUNTER — Encounter (HOSPITAL_COMMUNITY): Payer: Self-pay | Admitting: Orthopedic Surgery

## 2017-10-21 DIAGNOSIS — T84498A Other mechanical complication of other internal orthopedic devices, implants and grafts, initial encounter: Secondary | ICD-10-CM | POA: Diagnosis not present

## 2017-10-21 DIAGNOSIS — N189 Chronic kidney disease, unspecified: Secondary | ICD-10-CM | POA: Diagnosis not present

## 2017-10-21 DIAGNOSIS — M810 Age-related osteoporosis without current pathological fracture: Secondary | ICD-10-CM | POA: Diagnosis not present

## 2017-10-21 DIAGNOSIS — G250 Essential tremor: Secondary | ICD-10-CM | POA: Diagnosis not present

## 2017-10-21 DIAGNOSIS — M545 Low back pain: Secondary | ICD-10-CM | POA: Diagnosis not present

## 2017-10-21 DIAGNOSIS — F419 Anxiety disorder, unspecified: Secondary | ICD-10-CM | POA: Diagnosis not present

## 2017-10-21 DIAGNOSIS — T84092D Other mechanical complication of internal right knee prosthesis, subsequent encounter: Secondary | ICD-10-CM | POA: Diagnosis not present

## 2017-10-21 DIAGNOSIS — F319 Bipolar disorder, unspecified: Secondary | ICD-10-CM | POA: Diagnosis not present

## 2017-10-21 NOTE — Telephone Encounter (Signed)
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 D/C 10/20/17, and will f.u w/specialsit in 2 weeks...Johny Chess

## 2017-10-23 DIAGNOSIS — F419 Anxiety disorder, unspecified: Secondary | ICD-10-CM | POA: Diagnosis not present

## 2017-10-23 DIAGNOSIS — F319 Bipolar disorder, unspecified: Secondary | ICD-10-CM | POA: Diagnosis not present

## 2017-10-23 DIAGNOSIS — M810 Age-related osteoporosis without current pathological fracture: Secondary | ICD-10-CM | POA: Diagnosis not present

## 2017-10-23 DIAGNOSIS — M545 Low back pain: Secondary | ICD-10-CM | POA: Diagnosis not present

## 2017-10-23 DIAGNOSIS — G250 Essential tremor: Secondary | ICD-10-CM | POA: Diagnosis not present

## 2017-10-23 DIAGNOSIS — N189 Chronic kidney disease, unspecified: Secondary | ICD-10-CM | POA: Diagnosis not present

## 2017-10-23 DIAGNOSIS — T84092D Other mechanical complication of internal right knee prosthesis, subsequent encounter: Secondary | ICD-10-CM | POA: Diagnosis not present

## 2017-10-26 DIAGNOSIS — F319 Bipolar disorder, unspecified: Secondary | ICD-10-CM | POA: Diagnosis not present

## 2017-10-26 DIAGNOSIS — M810 Age-related osteoporosis without current pathological fracture: Secondary | ICD-10-CM | POA: Diagnosis not present

## 2017-10-26 DIAGNOSIS — G250 Essential tremor: Secondary | ICD-10-CM | POA: Diagnosis not present

## 2017-10-26 DIAGNOSIS — N189 Chronic kidney disease, unspecified: Secondary | ICD-10-CM | POA: Diagnosis not present

## 2017-10-26 DIAGNOSIS — F419 Anxiety disorder, unspecified: Secondary | ICD-10-CM | POA: Diagnosis not present

## 2017-10-26 DIAGNOSIS — M545 Low back pain: Secondary | ICD-10-CM | POA: Diagnosis not present

## 2017-10-26 DIAGNOSIS — T84092D Other mechanical complication of internal right knee prosthesis, subsequent encounter: Secondary | ICD-10-CM | POA: Diagnosis not present

## 2017-10-29 DIAGNOSIS — G250 Essential tremor: Secondary | ICD-10-CM | POA: Diagnosis not present

## 2017-10-29 DIAGNOSIS — T84092D Other mechanical complication of internal right knee prosthesis, subsequent encounter: Secondary | ICD-10-CM | POA: Diagnosis not present

## 2017-10-29 DIAGNOSIS — F419 Anxiety disorder, unspecified: Secondary | ICD-10-CM | POA: Diagnosis not present

## 2017-10-29 DIAGNOSIS — N189 Chronic kidney disease, unspecified: Secondary | ICD-10-CM | POA: Diagnosis not present

## 2017-10-29 DIAGNOSIS — F319 Bipolar disorder, unspecified: Secondary | ICD-10-CM | POA: Diagnosis not present

## 2017-10-29 DIAGNOSIS — M810 Age-related osteoporosis without current pathological fracture: Secondary | ICD-10-CM | POA: Diagnosis not present

## 2017-10-29 DIAGNOSIS — M545 Low back pain: Secondary | ICD-10-CM | POA: Diagnosis not present

## 2017-11-04 DIAGNOSIS — F419 Anxiety disorder, unspecified: Secondary | ICD-10-CM | POA: Diagnosis not present

## 2017-11-04 DIAGNOSIS — G250 Essential tremor: Secondary | ICD-10-CM | POA: Diagnosis not present

## 2017-11-04 DIAGNOSIS — M810 Age-related osteoporosis without current pathological fracture: Secondary | ICD-10-CM | POA: Diagnosis not present

## 2017-11-04 DIAGNOSIS — M545 Low back pain: Secondary | ICD-10-CM | POA: Diagnosis not present

## 2017-11-04 DIAGNOSIS — N189 Chronic kidney disease, unspecified: Secondary | ICD-10-CM | POA: Diagnosis not present

## 2017-11-04 DIAGNOSIS — T84092D Other mechanical complication of internal right knee prosthesis, subsequent encounter: Secondary | ICD-10-CM | POA: Diagnosis not present

## 2017-11-04 DIAGNOSIS — F319 Bipolar disorder, unspecified: Secondary | ICD-10-CM | POA: Diagnosis not present

## 2017-11-04 DIAGNOSIS — T84498D Other mechanical complication of other internal orthopedic devices, implants and grafts, subsequent encounter: Secondary | ICD-10-CM | POA: Diagnosis not present

## 2017-11-10 DIAGNOSIS — M1711 Unilateral primary osteoarthritis, right knee: Secondary | ICD-10-CM | POA: Diagnosis not present

## 2017-11-10 DIAGNOSIS — M6281 Muscle weakness (generalized): Secondary | ICD-10-CM | POA: Diagnosis not present

## 2017-11-10 DIAGNOSIS — M25561 Pain in right knee: Secondary | ICD-10-CM | POA: Diagnosis not present

## 2017-11-10 DIAGNOSIS — M25661 Stiffness of right knee, not elsewhere classified: Secondary | ICD-10-CM | POA: Diagnosis not present

## 2017-12-01 ENCOUNTER — Encounter: Payer: Self-pay | Admitting: Internal Medicine

## 2017-12-02 DIAGNOSIS — M1711 Unilateral primary osteoarthritis, right knee: Secondary | ICD-10-CM | POA: Diagnosis not present

## 2017-12-08 ENCOUNTER — Other Ambulatory Visit (INDEPENDENT_AMBULATORY_CARE_PROVIDER_SITE_OTHER): Payer: Medicare HMO

## 2017-12-08 ENCOUNTER — Ambulatory Visit (INDEPENDENT_AMBULATORY_CARE_PROVIDER_SITE_OTHER): Payer: Medicare HMO | Admitting: Internal Medicine

## 2017-12-08 ENCOUNTER — Encounter: Payer: Self-pay | Admitting: Internal Medicine

## 2017-12-08 VITALS — BP 90/58 | HR 60 | Temp 98.5°F | Ht 63.0 in | Wt 130.0 lb

## 2017-12-08 DIAGNOSIS — G250 Essential tremor: Secondary | ICD-10-CM

## 2017-12-08 DIAGNOSIS — Z1159 Encounter for screening for other viral diseases: Secondary | ICD-10-CM

## 2017-12-08 DIAGNOSIS — Z0000 Encounter for general adult medical examination without abnormal findings: Secondary | ICD-10-CM | POA: Diagnosis not present

## 2017-12-08 DIAGNOSIS — F319 Bipolar disorder, unspecified: Secondary | ICD-10-CM

## 2017-12-08 LAB — HEMOGLOBIN A1C: Hgb A1c MFr Bld: 5.4 % (ref 4.6–6.5)

## 2017-12-08 LAB — COMPREHENSIVE METABOLIC PANEL
ALT: 19 U/L (ref 0–35)
AST: 21 U/L (ref 0–37)
Albumin: 3.9 g/dL (ref 3.5–5.2)
Alkaline Phosphatase: 59 U/L (ref 39–117)
BUN: 7 mg/dL (ref 6–23)
CO2: 29 meq/L (ref 19–32)
CREATININE: 0.66 mg/dL (ref 0.40–1.20)
Calcium: 9.2 mg/dL (ref 8.4–10.5)
Chloride: 103 mEq/L (ref 96–112)
GFR: 93.46 mL/min (ref >60.00)
Glucose, Bld: 82 mg/dL (ref 70–99)
POTASSIUM: 3.7 meq/L (ref 3.5–5.1)
SODIUM: 139 meq/L (ref 135–145)
Total Bilirubin: 0.5 mg/dL (ref 0.2–1.2)
Total Protein: 7 g/dL (ref 6.0–8.3)

## 2017-12-08 LAB — LIPID PANEL
CHOL/HDL RATIO: 3
CHOLESTEROL: 233 mg/dL — AB (ref 0–200)
HDL: 71.9 mg/dL (ref >39.00)
LDL CALC: 122 mg/dL — AB (ref 0–99)
NonHDL: 161.34
TRIGLYCERIDES: 197 mg/dL — AB (ref 0.0–149.0)
VLDL: 39.4 mg/dL (ref 0.0–40.0)

## 2017-12-08 LAB — CBC
HCT: 39.4 % (ref 36.0–46.0)
HEMOGLOBIN: 13.3 g/dL (ref 12.0–15.0)
MCHC: 33.9 g/dL (ref 30.0–36.0)
MCV: 91 fl (ref 78.0–100.0)
PLATELETS: 300 10*3/uL (ref 150.0–400.0)
RBC: 4.33 Mil/uL (ref 3.87–5.11)
RDW: 14.1 % (ref 11.5–15.5)
WBC: 4.6 10*3/uL (ref 4.0–10.5)

## 2017-12-08 NOTE — Patient Instructions (Signed)
We will get the labs today and call you back about the results.  Health Maintenance, Female Adopting a healthy lifestyle and getting preventive care can go a long way to promote health and wellness. Talk with your health care provider about what schedule of regular examinations is right for you. This is a good chance for you to check in with your provider about disease prevention and staying healthy. In between checkups, there are plenty of things you can do on your own. Experts have done a lot of research about which lifestyle changes and preventive measures are most likely to keep you healthy. Ask your health care provider for more information. Weight and diet Eat a healthy diet  Be sure to include plenty of vegetables, fruits, low-fat dairy products, and lean protein.  Do not eat a lot of foods high in solid fats, added sugars, or salt.  Get regular exercise. This is one of the most important things you can do for your health. ? Most adults should exercise for at least 150 minutes each week. The exercise should increase your heart rate and make you sweat (moderate-intensity exercise). ? Most adults should also do strengthening exercises at least twice a week. This is in addition to the moderate-intensity exercise.  Maintain a healthy weight  Body mass index (BMI) is a measurement that can be used to identify possible weight problems. It estimates body fat based on height and weight. Your health care provider can help determine your BMI and help you achieve or maintain a healthy weight.  For females 54 years of age and older: ? A BMI below 18.5 is considered underweight. ? A BMI of 18.5 to 24.9 is normal. ? A BMI of 25 to 29.9 is considered overweight. ? A BMI of 30 and above is considered obese.  Watch levels of cholesterol and blood lipids  You should start having your blood tested for lipids and cholesterol at 73 years of age, then have this test every 5 years.  You may need to  have your cholesterol levels checked more often if: ? Your lipid or cholesterol levels are high. ? You are older than 73 years of age. ? You are at high risk for heart disease.  Cancer screening Lung Cancer  Lung cancer screening is recommended for adults 73-58 years old who are at high risk for lung cancer because of a history of smoking.  A yearly low-dose CT scan of the lungs is recommended for people who: ? Currently smoke. ? Have quit within the past 15 years. ? Have at least a 30-pack-year history of smoking. A pack year is smoking an average of one pack of cigarettes a day for 1 year.  Yearly screening should continue until it has been 15 years since you quit.  Yearly screening should stop if you develop a health problem that would prevent you from having lung cancer treatment.  Breast Cancer  Practice breast self-awareness. This means understanding how your breasts normally appear and feel.  It also means doing regular breast self-exams. Let your health care provider know about any changes, no matter how small.  If you are in your 20s or 30s, you should have a clinical breast exam (CBE) by a health care provider every 1-3 years as part of a regular health exam.  If you are 73 or older, have a CBE every year. Also consider having a breast X-ray (mammogram) every year.  If you have a family history of breast cancer, talk to  your health care provider about genetic screening.  If you are at high risk for breast cancer, talk to your health care provider about having an MRI and a mammogram every year.  Breast cancer gene (BRCA) assessment is recommended for women who have family members with BRCA-related cancers. BRCA-related cancers include: ? Breast. ? Ovarian. ? Tubal. ? Peritoneal cancers.  Results of the assessment will determine the need for genetic counseling and BRCA1 and BRCA2 testing.  Cervical Cancer Your health care provider may recommend that you be screened  regularly for cancer of the pelvic organs (ovaries, uterus, and vagina). This screening involves a pelvic examination, including checking for microscopic changes to the surface of your cervix (Pap test). You may be encouraged to have this screening done every 3 years, beginning at age 80.  For women ages 9-65, health care providers may recommend pelvic exams and Pap testing every 3 years, or they may recommend the Pap and pelvic exam, combined with testing for human papilloma virus (HPV), every 5 years. Some types of HPV increase your risk of cervical cancer. Testing for HPV may also be done on women of any age with unclear Pap test results.  Other health care providers may not recommend any screening for nonpregnant women who are considered low risk for pelvic cancer and who do not have symptoms. Ask your health care provider if a screening pelvic exam is right for you.  If you have had past treatment for cervical cancer or a condition that could lead to cancer, you need Pap tests and screening for cancer for at least 20 years after your treatment. If Pap tests have been discontinued, your risk factors (such as having a new sexual partner) need to be reassessed to determine if screening should resume. Some women have medical problems that increase the chance of getting cervical cancer. In these cases, your health care provider may recommend more frequent screening and Pap tests.  Colorectal Cancer  This type of cancer can be detected and often prevented.  Routine colorectal cancer screening usually begins at 73 years of age and continues through 73 years of age.  Your health care provider may recommend screening at an earlier age if you have risk factors for colon cancer.  Your health care provider may also recommend using home test kits to check for hidden blood in the stool.  A small camera at the end of a tube can be used to examine your colon directly (sigmoidoscopy or colonoscopy). This is  done to check for the earliest forms of colorectal cancer.  Routine screening usually begins at age 45.  Direct examination of the colon should be repeated every 5-10 years through 73 years of age. However, you may need to be screened more often if early forms of precancerous polyps or small growths are found.  Skin Cancer  Check your skin from head to toe regularly.  Tell your health care provider about any new moles or changes in moles, especially if there is a change in a mole's shape or color.  Also tell your health care provider if you have a mole that is larger than the size of a pencil eraser.  Always use sunscreen. Apply sunscreen liberally and repeatedly throughout the day.  Protect yourself by wearing long sleeves, pants, a wide-brimmed hat, and sunglasses whenever you are outside.  Heart disease, diabetes, and high blood pressure  High blood pressure causes heart disease and increases the risk of stroke. High blood pressure is more likely  to develop in: ? People who have blood pressure in the high end of the normal range (130-139/85-89 mm Hg). ? People who are overweight or obese. ? People who are African American.  If you are 41-24 years of age, have your blood pressure checked every 3-5 years. If you are 31 years of age or older, have your blood pressure checked every year. You should have your blood pressure measured twice-once when you are at a hospital or clinic, and once when you are not at a hospital or clinic. Record the average of the two measurements. To check your blood pressure when you are not at a hospital or clinic, you can use: ? An automated blood pressure machine at a pharmacy. ? A home blood pressure monitor.  If you are between 45 years and 72 years old, ask your health care provider if you should take aspirin to prevent strokes.  Have regular diabetes screenings. This involves taking a blood sample to check your fasting blood sugar level. ? If you are  at a normal weight and have a low risk for diabetes, have this test once every three years after 73 years of age. ? If you are overweight and have a high risk for diabetes, consider being tested at a younger age or more often. Preventing infection Hepatitis B  If you have a higher risk for hepatitis B, you should be screened for this virus. You are considered at high risk for hepatitis B if: ? You were born in a country where hepatitis B is common. Ask your health care provider which countries are considered high risk. ? Your parents were born in a high-risk country, and you have not been immunized against hepatitis B (hepatitis B vaccine). ? You have HIV or AIDS. ? You use needles to inject street drugs. ? You live with someone who has hepatitis B. ? You have had sex with someone who has hepatitis B. ? You get hemodialysis treatment. ? You take certain medicines for conditions, including cancer, organ transplantation, and autoimmune conditions.  Hepatitis C  Blood testing is recommended for: ? Everyone born from [birth year] through 1966. ? Anyone with known risk factors for hepatitis C.  Sexually transmitted infections (STIs)  You should be screened for sexually transmitted infections (STIs) including gonorrhea and chlamydia if: ? You are sexually active and are younger than 73 years of age. ? You are older than 73 years of age and your health care provider tells you that you are at risk for this type of infection. ? Your sexual activity has changed since you were last screened and you are at an increased risk for chlamydia or gonorrhea. Ask your health care provider if you are at risk.  If you do not have HIV, but are at risk, it may be recommended that you take a prescription medicine daily to prevent HIV infection. This is called pre-exposure prophylaxis (PrEP). You are considered at risk if: ? You are sexually active and do not regularly use condoms or know the HIV status of your  partner(s). ? You take drugs by injection. ? You are sexually active with a partner who has HIV.  Talk with your health care provider about whether you are at high risk of being infected with HIV. If you choose to begin PrEP, you should first be tested for HIV. You should then be tested every 3 months for as long as you are taking PrEP. Pregnancy  If you are premenopausal and you may become  pregnant, ask your health care provider about preconception counseling.  If you may become pregnant, take 400 to 800 micrograms (mcg) of folic acid every day.  If you want to prevent pregnancy, talk to your health care provider about birth control (contraception). Osteoporosis and menopause  Osteoporosis is a disease in which the bones lose minerals and strength with aging. This can result in serious bone fractures. Your risk for osteoporosis can be identified using a bone density scan.  If you are 26 years of age or older, or if you are at risk for osteoporosis and fractures, ask your health care provider if you should be screened.  Ask your health care provider whether you should take a calcium or vitamin D supplement to lower your risk for osteoporosis.  Menopause may have certain physical symptoms and risks.  Hormone replacement therapy may reduce some of these symptoms and risks. Talk to your health care provider about whether hormone replacement therapy is right for you. Follow these instructions at home:  Schedule regular health, dental, and eye exams.  Stay current with your immunizations.  Do not use any tobacco products including cigarettes, chewing tobacco, or electronic cigarettes.  If you are pregnant, do not drink alcohol.  If you are breastfeeding, limit how much and how often you drink alcohol.  Limit alcohol intake to no more than 1 drink per day for nonpregnant women. One drink equals 12 ounces of beer, 5 ounces of wine, or 1 ounces of hard liquor.  Do not use street  drugs.  Do not share needles.  Ask your health care provider for help if you need support or information about quitting drugs.  Tell your health care provider if you often feel depressed.  Tell your health care provider if you have ever been abused or do not feel safe at home. This information is not intended to replace advice given to you by your health care provider. Make sure you discuss any questions you have with your health care provider. Document Released: 03/03/2011 Document Revised: 01/24/2016 Document Reviewed: 05/22/2015 Elsevier Interactive Patient Education  [employer].

## 2017-12-08 NOTE — Progress Notes (Signed)
   Subjective:    Patient ID: Leah Jensen, female    DOB: Jul 15, 1946, 72 y.o.   MRN: [id number]  HPI Here for medicare wellness and physical, no new complaints. Please see A/P for status and treatment of chronic medical problems.   Diet: heart healthy Physical activity: active Depression/mood screen: negative Hearing: intact to whispered voice Visual acuity: grossly normal, performs annual eye exam  ADLs: capable Fall risk: none Home safety: good Cognitive evaluation: intact to orientation, naming, recall and repetition EOL planning: adv directives discussed  I have personally reviewed and have noted 1. The patient's medical and social history - reviewed today no changes 2. Their use of alcohol, tobacco or illicit drugs 3. Their current medications and supplements 4. The patient's functional ability including ADL's, fall risks, home safety risks and hearing or visual impairment. 5. Diet and physical activities 6. Evidence for depression or mood disorders 7. Care team reviewed and updated (available in snapshot)  Review of Systems  Constitutional: Negative.   HENT: Negative.   Eyes: Negative.   Respiratory: Negative for cough, chest tightness and shortness of breath.   Cardiovascular: Negative for chest pain, palpitations and leg swelling.  Gastrointestinal: Negative for abdominal distention, abdominal pain, constipation, diarrhea, nausea and vomiting.  Musculoskeletal: Negative.   Skin: Negative.   Neurological: Negative.   Psychiatric/Behavioral: Negative.       Objective:   Physical Exam  Constitutional: She is oriented to person, place, and time. She appears well-developed and well-nourished.  HENT:  Head: Normocephalic and atraumatic.  Eyes: EOM are normal.  Neck: Normal range of motion.  Cardiovascular: Normal rate and regular rhythm.  Pulmonary/Chest: Effort normal and breath sounds normal. No respiratory distress. She has no wheezes. She has no rales.    Abdominal: Soft. Bowel sounds are normal. She exhibits no distension. There is no tenderness. There is no rebound.  Musculoskeletal: She exhibits no edema.  Neurological: She is alert and oriented to person, place, and time. Coordination normal.  Skin: Skin is warm and dry.  Psychiatric: She has a normal mood and affect.   Vitals:   12/08/17 1514  BP: (!) 90/58  Pulse: 60  Temp: 98.5 F (36.9 C)  TempSrc: Oral  SpO2: 98%  Weight: 130 lb (59 kg)  Height: 5\' 3"  (1.6 m)      Assessment & Plan:

## 2017-12-10 DIAGNOSIS — Z6822 Body mass index (BMI) 22.0-22.9, adult: Secondary | ICD-10-CM | POA: Diagnosis not present

## 2017-12-10 DIAGNOSIS — M810 Age-related osteoporosis without current pathological fracture: Secondary | ICD-10-CM | POA: Diagnosis not present

## 2017-12-10 DIAGNOSIS — Z01419 Encounter for gynecological examination (general) (routine) without abnormal findings: Secondary | ICD-10-CM | POA: Diagnosis not present

## 2017-12-10 DIAGNOSIS — N951 Menopausal and female climacteric states: Secondary | ICD-10-CM | POA: Diagnosis not present

## 2017-12-10 DIAGNOSIS — F331 Major depressive disorder, recurrent, moderate: Secondary | ICD-10-CM | POA: Diagnosis not present

## 2017-12-11 LAB — HCV RNA,QUANTITATIVE REAL TIME PCR
HCV Quantitative Log: <1.18 Log IU/mL
HCV RNA, PCR, QN: <15 IU/mL

## 2017-12-11 LAB — HEPATITIS C ANTIBODY
Hepatitis C Ab: REACTIVE — AB
SIGNAL TO CUT-OFF: 3.77 — AB (ref <1.00)

## 2017-12-11 NOTE — Assessment & Plan Note (Signed)
Colonoscopy due in 2023, mammogram up to date, aged out of pap smear. Reminded about yearly flu vaccine, shingrix counseling given, tetanus and pneumonia up to date. Counseled about sun safety and mole surveillance. Given 10 year screening recommendations.

## 2017-12-11 NOTE — Assessment & Plan Note (Signed)
Taking propranolol as needed for tremor.

## 2017-12-11 NOTE — Assessment & Plan Note (Signed)
Seeing psych for meds and stable and she will continue.

## 2017-12-30 DIAGNOSIS — F332 Major depressive disorder, recurrent severe without psychotic features: Secondary | ICD-10-CM | POA: Diagnosis not present

## 2018-01-14 DIAGNOSIS — F332 Major depressive disorder, recurrent severe without psychotic features: Secondary | ICD-10-CM | POA: Diagnosis not present

## 2018-01-28 DIAGNOSIS — R69 Illness, unspecified: Secondary | ICD-10-CM | POA: Diagnosis not present

## 2018-02-03 DIAGNOSIS — M1711 Unilateral primary osteoarthritis, right knee: Secondary | ICD-10-CM | POA: Diagnosis not present

## 2018-02-09 DIAGNOSIS — F332 Major depressive disorder, recurrent severe without psychotic features: Secondary | ICD-10-CM | POA: Diagnosis not present

## 2018-03-23 DIAGNOSIS — F330 Major depressive disorder, recurrent, mild: Secondary | ICD-10-CM | POA: Diagnosis not present

## 2018-03-25 DIAGNOSIS — F330 Major depressive disorder, recurrent, mild: Secondary | ICD-10-CM | POA: Diagnosis not present

## 2018-04-20 DIAGNOSIS — F330 Major depressive disorder, recurrent, mild: Secondary | ICD-10-CM | POA: Diagnosis not present

## 2018-05-25 DIAGNOSIS — F330 Major depressive disorder, recurrent, mild: Secondary | ICD-10-CM | POA: Diagnosis not present

## 2018-06-10 ENCOUNTER — Ambulatory Visit: Payer: Self-pay | Admitting: Internal Medicine

## 2018-06-15 DIAGNOSIS — F330 Major depressive disorder, recurrent, mild: Secondary | ICD-10-CM | POA: Diagnosis not present

## 2018-06-24 ENCOUNTER — Ambulatory Visit (INDEPENDENT_AMBULATORY_CARE_PROVIDER_SITE_OTHER): Payer: Medicare HMO | Admitting: Internal Medicine

## 2018-06-24 ENCOUNTER — Encounter: Payer: Self-pay | Admitting: Internal Medicine

## 2018-06-24 VITALS — BP 100/60 | HR 68 | Temp 98.4°F | Ht 63.0 in | Wt 133.0 lb

## 2018-06-24 DIAGNOSIS — J358 Other chronic diseases of tonsils and adenoids: Secondary | ICD-10-CM | POA: Diagnosis not present

## 2018-06-24 DIAGNOSIS — R1310 Dysphagia, unspecified: Secondary | ICD-10-CM | POA: Diagnosis not present

## 2018-06-24 DIAGNOSIS — M810 Age-related osteoporosis without current pathological fracture: Secondary | ICD-10-CM | POA: Diagnosis not present

## 2018-06-24 NOTE — Patient Instructions (Signed)
We will get you in with an ENT doctor to check the tonsils.   We will get the imaging test of the swallowing.   We will get you back on prolia.

## 2018-06-24 NOTE — Progress Notes (Signed)
   Subjective:    Patient ID: Leah Jensen, female    DOB: 1946-05-17, 72 y.o.   MRN: [id number]  HPI The patient is a 72 YO female coming in for several concerns including tonsil problem (she has had pitted tonsils for some time, they do have odor and stones to come out, the left one is also having some green discharge which is bothering her, she denies pain in the tonsils except when she tries to express the discharge/stones, had a lot of infections as a child and almost had them out several times, prior procedure on right tonsil sounds to be I and D she does not know what), and dysphagia (she is getting food stuck and pills and even water stuck with swallowing, she denies change in this lately but is just to the point she wants it evaluated, long ago had swallowing study which did not show much, has not affected eating or weight), and osteoporosis (she was on prolia in the past some years ago but could never get the injections on time due to office and insurance, she is wanting to try this again as she never got enough in a row to see if it was helping her bones, took oral bisphosphonates but they did not help her bones, no recent falls and no current fractures).   Review of Systems  Constitutional: Negative.   HENT: Positive for sore throat and trouble swallowing.        Tonsil problem  Eyes: Negative.   Respiratory: Negative for cough, chest tightness and shortness of breath.   Cardiovascular: Negative for chest pain, palpitations and leg swelling.  Gastrointestinal: Negative for abdominal distention, abdominal pain, constipation, diarrhea, nausea and vomiting.  Musculoskeletal: Negative.   Skin: Negative.   Neurological: Negative.   Psychiatric/Behavioral: Negative.       Objective:   Physical Exam  Constitutional: She is oriented to person, place, and time. She appears well-developed and well-nourished.  HENT:  Head: Normocephalic and atraumatic.  Tonsils bilateral present and  pitted, left with some possible green plaque on the posterior aspect unable to visualize well, no appearance of infection currently.  Eyes: EOM are normal.  Neck: Normal range of motion.  Cardiovascular: Normal rate and regular rhythm.  Pulmonary/Chest: Effort normal and breath sounds normal. No respiratory distress. She has no wheezes. She has no rales.  Abdominal: Soft. Bowel sounds are normal. She exhibits no distension. There is no tenderness. There is no rebound.  Musculoskeletal: She exhibits no edema.  Neurological: She is alert and oriented to person, place, and time. Coordination normal.  Skin: Skin is warm and dry.  Psychiatric: She has a normal mood and affect.   Vitals:   06/24/18 1412  BP: 100/60  Pulse: 68  Temp: 98.4 F (36.9 C)  TempSrc: Oral  SpO2: 97%  Weight: 133 lb (60.3 kg)  Height: 5\' 3"  (1.6 m)      Assessment & Plan:

## 2018-06-25 DIAGNOSIS — J358 Other chronic diseases of tonsils and adenoids: Secondary | ICD-10-CM | POA: Insufficient documentation

## 2018-06-25 DIAGNOSIS — R1310 Dysphagia, unspecified: Secondary | ICD-10-CM | POA: Insufficient documentation

## 2018-06-25 NOTE — Assessment & Plan Note (Signed)
Referral to ENT and she is hoping for removal of tonsils due to the discomfort and odor as well as the stones do bother her. She is having some problems with pills getting caught on the tonsils or food lately.

## 2018-06-25 NOTE — Assessment & Plan Note (Signed)
Will start back prolia and explained to her that the process for this has changed as well as insurance coverage is much easier now. Needs DEXA 2 years after start prolia for efficacy.

## 2018-06-25 NOTE — Assessment & Plan Note (Signed)
Ordered barium swallow to evaluate for stricture or motility problem. If no problems needs to see GI for direct visualization.

## 2018-07-07 DIAGNOSIS — Z961 Presence of intraocular lens: Secondary | ICD-10-CM | POA: Diagnosis not present

## 2018-07-07 DIAGNOSIS — H5203 Hypermetropia, bilateral: Secondary | ICD-10-CM | POA: Diagnosis not present

## 2018-07-07 DIAGNOSIS — H04123 Dry eye syndrome of bilateral lacrimal glands: Secondary | ICD-10-CM | POA: Diagnosis not present

## 2018-07-15 DIAGNOSIS — F330 Major depressive disorder, recurrent, mild: Secondary | ICD-10-CM | POA: Diagnosis not present

## 2018-07-21 DIAGNOSIS — J039 Acute tonsillitis, unspecified: Secondary | ICD-10-CM | POA: Diagnosis not present

## 2018-07-21 DIAGNOSIS — K210 Gastro-esophageal reflux disease with esophagitis: Secondary | ICD-10-CM | POA: Diagnosis not present

## 2018-08-11 DIAGNOSIS — F331 Major depressive disorder, recurrent, moderate: Secondary | ICD-10-CM | POA: Diagnosis not present

## 2018-08-13 DIAGNOSIS — F331 Major depressive disorder, recurrent, moderate: Secondary | ICD-10-CM | POA: Diagnosis not present

## 2018-09-08 DIAGNOSIS — Z961 Presence of intraocular lens: Secondary | ICD-10-CM | POA: Diagnosis not present

## 2018-09-14 DIAGNOSIS — F331 Major depressive disorder, recurrent, moderate: Secondary | ICD-10-CM | POA: Diagnosis not present

## 2018-09-29 DIAGNOSIS — Z1231 Encounter for screening mammogram for malignant neoplasm of breast: Secondary | ICD-10-CM | POA: Diagnosis not present

## 2018-09-29 DIAGNOSIS — F331 Major depressive disorder, recurrent, moderate: Secondary | ICD-10-CM | POA: Diagnosis not present

## 2018-10-05 ENCOUNTER — Other Ambulatory Visit: Payer: Self-pay | Admitting: Obstetrics & Gynecology

## 2018-10-05 DIAGNOSIS — N6489 Other specified disorders of breast: Secondary | ICD-10-CM

## 2018-10-12 ENCOUNTER — Ambulatory Visit: Payer: Self-pay

## 2018-10-12 ENCOUNTER — Ambulatory Visit
Admission: RE | Admit: 2018-10-12 | Discharge: 2018-10-12 | Disposition: A | Discharge status: Home / Self Care | Payer: Medicare HMO | Source: Ambulatory Visit | Attending: Obstetrics & Gynecology | Admitting: Obstetrics & Gynecology

## 2018-10-12 DIAGNOSIS — R922 Inconclusive mammogram: Secondary | ICD-10-CM | POA: Diagnosis not present

## 2018-10-12 DIAGNOSIS — N6489 Other specified disorders of breast: Secondary | ICD-10-CM

## 2018-10-12 LAB — HM MAMMOGRAPHY

## 2018-10-20 DIAGNOSIS — F331 Major depressive disorder, recurrent, moderate: Secondary | ICD-10-CM | POA: Diagnosis not present

## 2018-11-18 DIAGNOSIS — F330 Major depressive disorder, recurrent, mild: Secondary | ICD-10-CM | POA: Diagnosis not present

## 2018-11-25 DIAGNOSIS — F330 Major depressive disorder, recurrent, mild: Secondary | ICD-10-CM | POA: Diagnosis not present

## 2018-12-15 ENCOUNTER — Encounter: Payer: Self-pay | Admitting: Internal Medicine

## 2018-12-16 ENCOUNTER — Encounter: Payer: Self-pay | Admitting: Internal Medicine

## 2018-12-16 NOTE — Progress Notes (Signed)
Results in imaging in patient chart

## 2019-01-12 DIAGNOSIS — F330 Major depressive disorder, recurrent, mild: Secondary | ICD-10-CM | POA: Diagnosis not present

## 2019-01-18 ENCOUNTER — Ambulatory Visit (INDEPENDENT_AMBULATORY_CARE_PROVIDER_SITE_OTHER): Payer: Medicare HMO | Admitting: Internal Medicine

## 2019-01-18 ENCOUNTER — Other Ambulatory Visit: Payer: Self-pay

## 2019-01-18 ENCOUNTER — Encounter: Payer: Self-pay | Admitting: Internal Medicine

## 2019-01-18 ENCOUNTER — Telehealth: Payer: Self-pay | Admitting: Internal Medicine

## 2019-01-18 DIAGNOSIS — R232 Flushing: Secondary | ICD-10-CM | POA: Diagnosis not present

## 2019-01-18 MED ORDER — ESTRADIOL 0.025 MG/24HR TD PTWK: 0.0250 mg | MEDICATED_PATCH | TRANSDERMAL | 3 refills | Status: AC

## 2019-01-18 MED ORDER — PROGESTERONE MICRONIZED 100 MG PO CAPS: 100.0000 mg | ORAL_CAPSULE | Freq: Every day | ORAL | 3 refills | Status: AC

## 2019-01-18 MED ORDER — PROGESTERONE MICRONIZED 100 MG PO CAPS: 100.0000 mg | ORAL_CAPSULE | Freq: Every day | ORAL | 3 refills | Status: DC

## 2019-01-18 NOTE — Assessment & Plan Note (Signed)
Rx estrogen and progesterone for hot flashes which are severe. Her mother (29s) still having hot flashes and she recalls her grandmother with long term hot flashes as well.

## 2019-01-18 NOTE — Progress Notes (Signed)
Virtual Visit via Video Note  I connected with Leah Jensen on 01/18/19 at  3:20 PM EDT by a video enabled telemedicine application and verified that I am speaking with the correct person using two identifiers.  The patient and the provider were at separate locations throughout the entire encounter.   I discussed the limitations of evaluation and management by telemedicine and the availability of in person appointments. The patient expressed understanding and agreed to proceed.  History of Present Illness: The patient is a 74 y.o. female with visit for wanting rx for estradiol patch. She had been getting this from her ob/gyn and they have retired. Started years ago with menopause. She has severe hot flashes and prior to patches was barely sleeping. She is now having rare hot flashes. They are still severe when they happen. Taking progesterone daily as well due to having uterus. Has no fevers or chills. Denies SOB or chest pains. Overall it is stable.  Observations/Objective: Appearance: normal, breathing appears normal, casual grooming, abdomen does not appear distended, mental status is A and O times 3  Assessment and Plan: See problem oriented charting  Follow Up Instructions: rx estrogen patch and progesterone.  I discussed the assessment and treatment plan with the patient. The patient was provided an opportunity to ask questions and all were answered. The patient agreed with the plan and demonstrated an understanding of the instructions.   The patient was advised to call back or seek an in-person evaluation if the symptoms worsen or if the condition fails to improve as anticipated.  Hoyt Koch, MD

## 2019-01-18 NOTE — Telephone Encounter (Signed)
Error

## 2019-01-19 ENCOUNTER — Encounter: Payer: Self-pay | Admitting: Internal Medicine

## 2019-01-20 ENCOUNTER — Encounter: Payer: Self-pay | Admitting: Internal Medicine

## 2019-02-16 DIAGNOSIS — F330 Major depressive disorder, recurrent, mild: Secondary | ICD-10-CM | POA: Diagnosis not present

## 2019-02-23 DIAGNOSIS — F330 Major depressive disorder, recurrent, mild: Secondary | ICD-10-CM | POA: Diagnosis not present

## 2019-03-05 DIAGNOSIS — S0191XA Laceration without foreign body of unspecified part of head, initial encounter: Secondary | ICD-10-CM | POA: Diagnosis not present

## 2019-03-24 ENCOUNTER — Encounter: Payer: Self-pay | Admitting: Internal Medicine

## 2019-03-25 NOTE — Telephone Encounter (Signed)
MD is out of the office this week. Will hold until she return on Monday.Marland KitchenJohny Jensen

## 2019-03-29 NOTE — Telephone Encounter (Signed)
Appointment has been made for 7/30.

## 2019-03-31 ENCOUNTER — Other Ambulatory Visit (INDEPENDENT_AMBULATORY_CARE_PROVIDER_SITE_OTHER): Payer: Medicare HMO

## 2019-03-31 ENCOUNTER — Other Ambulatory Visit: Payer: Self-pay

## 2019-03-31 ENCOUNTER — Ambulatory Visit (INDEPENDENT_AMBULATORY_CARE_PROVIDER_SITE_OTHER): Payer: Medicare HMO | Admitting: Internal Medicine

## 2019-03-31 ENCOUNTER — Encounter: Payer: Self-pay | Admitting: Internal Medicine

## 2019-03-31 VITALS — BP 90/60 | HR 60 | Temp 97.9°F | Ht 63.0 in | Wt 137.0 lb

## 2019-03-31 DIAGNOSIS — Z0000 Encounter for general adult medical examination without abnormal findings: Secondary | ICD-10-CM

## 2019-03-31 DIAGNOSIS — F319 Bipolar disorder, unspecified: Secondary | ICD-10-CM | POA: Diagnosis not present

## 2019-03-31 DIAGNOSIS — M810 Age-related osteoporosis without current pathological fracture: Secondary | ICD-10-CM

## 2019-03-31 DIAGNOSIS — G250 Essential tremor: Secondary | ICD-10-CM

## 2019-03-31 LAB — COMPREHENSIVE METABOLIC PANEL
ALT: 22 U/L (ref 0–35)
AST: 22 U/L (ref 0–37)
Albumin: 3.9 g/dL (ref 3.5–5.2)
Alkaline Phosphatase: 54 U/L (ref 39–117)
BUN: 22 mg/dL (ref 6–23)
CO2: 30 mEq/L (ref 19–32)
Calcium: 9.9 mg/dL (ref 8.4–10.5)
Chloride: 102 mEq/L (ref 96–112)
Creatinine, Ser: 0.76 mg/dL (ref 0.40–1.20)
GFR: 74.45 mL/min (ref >60.00)
Glucose, Bld: 78 mg/dL (ref 70–99)
Potassium: 4.4 mEq/L (ref 3.5–5.1)
Sodium: 138 mEq/L (ref 135–145)
Total Bilirubin: 0.3 mg/dL (ref 0.2–1.2)
Total Protein: 6.5 g/dL (ref 6.0–8.3)

## 2019-03-31 LAB — VITAMIN D 25 HYDROXY (VIT D DEFICIENCY, FRACTURES): VITD: 91.56 ng/mL (ref 30.00–100.00)

## 2019-03-31 LAB — CBC
HCT: 39.8 % (ref 36.0–46.0)
Hemoglobin: 13.4 g/dL (ref 12.0–15.0)
MCHC: 33.7 g/dL (ref 30.0–36.0)
MCV: 94.3 fl (ref 78.0–100.0)
Platelets: 228 10*3/uL (ref 150.0–400.0)
RBC: 4.22 Mil/uL (ref 3.87–5.11)
RDW: 13.3 % (ref 11.5–15.5)
WBC: 5 10*3/uL (ref 4.0–10.5)

## 2019-03-31 LAB — LIPID PANEL
Cholesterol: 233 mg/dL — ABNORMAL HIGH (ref 0–200)
HDL: 59.7 mg/dL (ref >39.00)
NonHDL: 173.47
Total CHOL/HDL Ratio: 4
Triglycerides: 203 mg/dL — ABNORMAL HIGH (ref 0.0–149.0)
VLDL: 40.6 mg/dL — ABNORMAL HIGH (ref 0.0–40.0)

## 2019-03-31 LAB — LDL CHOLESTEROL, DIRECT: Direct LDL: 68 mg/dL

## 2019-03-31 MED ORDER — ZOSTER VAC RECOMB ADJUVANTED 50 MCG/0.5ML IM SUSR: 0.5000 mL | Freq: Once | INTRAMUSCULAR | 1 refills | Status: AC

## 2019-03-31 NOTE — Progress Notes (Signed)
Subjective:   Patient ID: Leah Jensen, female    DOB: 1945-11-13, 73 y.o.   MRN: [id number]  HPI Here for medicare wellness and physical, no new complaints. Please see A/P for status and treatment of chronic medical problems.   Diet: heart healthy Physical activity: sedentary Depression/mood screen: negative Hearing: intact to whispered voice Visual acuity: grossly normal, performs annual eye exam  ADLs: capable Fall risk: none Home safety: good Cognitive evaluation: intact to orientation, naming, recall and repetition EOL planning: adv directives discussed    Office Visit from 03/31/2019 in [place]  PHQ-2 Total Score  1        Office Visit from 01/04/2016 in Primary [facility] at [facility]  PHQ-9 Total Score  4      I have personally reviewed and have noted 1. The patient's medical and social history - reviewed today no changes 2. Their use of alcohol, tobacco or illicit drugs 3. Their current medications and supplements 4. The patient's functional ability including ADL's, fall risks, home safety risks and hearing or visual impairment. 5. Diet and physical activities 6. Evidence for depression or mood disorders 7. Care team reviewed and updated  Patient Care Team: Hoyt Koch, MD as PCP - General (Internal Medicine) Ladene Artist, MD (Gastroenterology) Leo Grosser Seymour Bars, MD (Obstetrics and Gynecology) Almedia Balls, MD (Orthopedic Surgery) Past Medical History:  Diagnosis Date  . ALLERGIC RHINITIS   . Anxiety   . Bipolar disorder ([place])   . Cataract    slight on lt. eye  . DEPRESSION/ANXIETY   . Low back pain   . Osteoarthritis    "probably qwhere/Dr. Percell Miller" (06/28/2015)  . 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 Palpitations   . TREMOR, ESSENTIAL    Past Surgical History:  Procedure Laterality Date  . ANAL FISSURE REPAIR  1990's  . BREAST BIOPSY  Left ~ 1990  . BREAST EXCISIONAL BIOPSY Right    benign patient not sure of dates  . CATARACT EXTRACTION Bilateral 06/02/2017 & 06/30/2017  . child birth  78; 60  . COLONOSCOPY  2013  . EYE SURGERY    . JOINT REPLACEMENT    . POLYPECTOMY    . 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 TOTAL KNEE ARTHROPLASTY WITH REVISION COMPONENTS Right 10/19/2017  . TOTAL KNEE ARTHROPLASTY WITH REVISION COMPONENTS Right 10/19/2017   Procedure: TOTAL KNEE ARTHROPLASTY WITH REVISION COMPONENTS;  Surgeon: Renette Butters, MD;  Location: [facility];  Service: Orthopedics;  Laterality: Right;   Family History  Problem Relation Age of Onset  . Arthritis Mother   . Heart disease Mother   . Lung disease Mother   . Stroke Mother   . Arthritis Father   . Heart disease Father   . Breast cancer Paternal Aunt   . Colon cancer Neg Hx   . Colon polyps Neg Hx   . Esophageal cancer Neg Hx   . Rectal cancer Neg Hx   . Stomach cancer Neg Hx     Review of Systems  Constitutional: Negative.   HENT: Negative.   Eyes: Negative.   Respiratory: Negative for cough, chest tightness and shortness of breath.   Cardiovascular: Negative for chest pain, palpitations and leg swelling.  Gastrointestinal: Negative for abdominal distention, abdominal pain, constipation, diarrhea,  nausea and vomiting.  Musculoskeletal: Negative.   Skin: Negative.   Neurological: Negative.   Psychiatric/Behavioral: Negative.     Objective:  Physical Exam Constitutional:      Appearance: She is well-developed.  HENT:     Head: Normocephalic and atraumatic.  Neck:     Musculoskeletal: Normal range of motion.  Cardiovascular:     Rate and Rhythm: Normal rate and regular rhythm.  Pulmonary:     Effort: Pulmonary effort is normal. No respiratory distress.     Breath sounds: Normal breath sounds. No  wheezing or rales.  Abdominal:     General: Bowel sounds are normal. There is no distension.     Palpations: Abdomen is soft.     Tenderness: There is no abdominal tenderness. There is no rebound.  Skin:    General: Skin is warm and dry.  Neurological:     Mental Status: She is alert and oriented to person, place, and time.     Coordination: Coordination normal.     Vitals:   03/31/19 1450  BP: 90/60  Pulse: 60  Temp: 97.9 F (36.6 C)  TempSrc: Oral  SpO2: 96%  Weight: 137 lb (62.1 kg)  Height: 5\' 3"  (1.6 m)    Assessment & Plan:

## 2019-03-31 NOTE — Patient Instructions (Signed)
Health Maintenance, Female Adopting a healthy lifestyle and getting preventive care are important in promoting health and wellness. Ask your health care provider about:  The right schedule for you to have regular tests and exams.  Things you can do on your own to prevent diseases and keep yourself healthy. What should I know about diet, weight, and exercise? Eat a healthy diet   Eat a diet that includes plenty of vegetables, fruits, low-fat dairy products, and lean protein.  Do not eat a lot of foods that are high in solid fats, added sugars, or sodium. Maintain a healthy weight Body mass index (BMI) is used to identify weight problems. It estimates body fat based on height and weight. Your health care provider can help determine your BMI and help you achieve or maintain a healthy weight. Get regular exercise Get regular exercise. This is one of the most important things you can do for your health. Most adults should:  Exercise for at least 150 minutes each week. The exercise should increase your heart rate and make you sweat (moderate-intensity exercise).  Do strengthening exercises at least twice a week. This is in addition to the moderate-intensity exercise.  Spend less time sitting. Even light physical activity can be beneficial. Watch cholesterol and blood lipids Have your blood tested for lipids and cholesterol at 74 years of age, then have this test every 5 years. Have your cholesterol levels checked more often if:  Your lipid or cholesterol levels are high.  You are older than 74 years of age.  You are at high risk for heart disease. What should I know about cancer screening? Depending on your health history and family history, you may need to have cancer screening at various ages. This may include screening for:  Breast cancer.  Cervical cancer.  Colorectal cancer.  Skin cancer.  Lung cancer. What should I know about heart disease, diabetes, and high blood  pressure? Blood pressure and heart disease  High blood pressure causes heart disease and increases the risk of stroke. This is more likely to develop in people who have high blood pressure readings, are of African descent, or are overweight.  Have your blood pressure checked: ? Every 3-5 years if you are 18-39 years of age. ? Every year if you are 40 years old or older. Diabetes Have regular diabetes screenings. This checks your fasting blood sugar level. Have the screening done:  Once every three years after age 40 if you are at a normal weight and have a low risk for diabetes.  More often and at a younger age if you are overweight or have a high risk for diabetes. What should I know about preventing infection? Hepatitis B If you have a higher risk for hepatitis B, you should be screened for this virus. Talk with your health care provider to find out if you are at risk for hepatitis B infection. Hepatitis C Testing is recommended for:  Everyone born from 1945 through 1965.  Anyone with known risk factors for hepatitis C. Sexually transmitted infections (STIs)  Get screened for STIs, including gonorrhea and chlamydia, if: ? You are sexually active and are younger than 74 years of age. ? You are older than 74 years of age and your health care provider tells you that you are at risk for this type of infection. ? Your sexual activity has changed since you were last screened, and you are at increased risk for chlamydia or gonorrhea. Ask your health care provider if   you are at risk.  Ask your health care provider about whether you are at high risk for HIV. Your health care provider may recommend a prescription medicine to help prevent HIV infection. If you choose to take medicine to prevent HIV, you should first get tested for HIV. You should then be tested every 3 months for as long as you are taking the medicine. Pregnancy  If you are about to stop having your period (premenopausal) and  you may become pregnant, seek counseling before you get pregnant.  Take 400 to 800 micrograms (mcg) of folic acid every day if you become pregnant.  Ask for birth control (contraception) if you want to prevent pregnancy. Osteoporosis and menopause Osteoporosis is a disease in which the bones lose minerals and strength with aging. This can result in bone fractures. If you are 65 years old or older, or if you are at risk for osteoporosis and fractures, ask your health care provider if you should:  Be screened for bone loss.  Take a calcium or vitamin D supplement to lower your risk of fractures.  Be given hormone replacement therapy (HRT) to treat symptoms of menopause. Follow these instructions at home: Lifestyle  Do not use any products that contain nicotine or tobacco, such as cigarettes, e-cigarettes, and chewing tobacco. If you need help quitting, ask your health care provider.  Do not use street drugs.  Do not share needles.  Ask your health care provider for help if you need support or information about quitting drugs. Alcohol use  Do not drink alcohol if: ? Your health care provider tells you not to drink. ? You are pregnant, may be pregnant, or are planning to become pregnant.  If you drink alcohol: ? Limit how much you use to 0-1 drink a day. ? Limit intake if you are breastfeeding.  Be aware of how much alcohol is in your drink. In the [place] one drink equals one 12 oz bottle of beer (355 mL), one 5 oz glass of wine (148 mL), or one 1 oz glass of hard liquor (44 mL). General instructions  Schedule regular health, dental, and eye exams.  Stay current with your vaccines.  Tell your health care provider if: ? You often feel depressed. ? You have ever been abused or do not feel safe at home. Summary  Adopting a healthy lifestyle and getting preventive care are important in promoting health and wellness.  Follow your health care provider's instructions about healthy  diet, exercising, and getting tested or screened for diseases.  Follow your health care provider's instructions on monitoring your cholesterol and blood pressure. This information is not intended to replace advice given to you by your health care provider. Make sure you discuss any questions you have with your health care provider. Document Released: 03/03/2011 Document Revised: 08/11/2018 Document Reviewed: 08/11/2018 Elsevier Patient Education  2020 Elsevier Inc.  

## 2019-04-01 NOTE — Assessment & Plan Note (Signed)
Stable per her reports and sees psych for management and meds. Coping okay with pandemic.

## 2019-04-01 NOTE — Assessment & Plan Note (Signed)
Flu shot yearly. Pneumonia complete. Shingrix rx given today. Tetanus due 2026. Colonoscopy due 2023. Mammogram due 2022, pap smear aged out and dexa due after 4 prolia injections hopefully 2022. Counseled about sun safety and mole surveillance. Counseled about the dangers of distracted driving. Given 10 year screening recommendations.

## 2019-04-01 NOTE — Assessment & Plan Note (Signed)
Trying to get prolia approved and then will start. DEXA after 4 prolia.

## 2019-04-04 ENCOUNTER — Encounter: Payer: Self-pay | Admitting: Internal Medicine

## 2019-04-26 ENCOUNTER — Encounter: Payer: Self-pay | Admitting: Internal Medicine

## 2019-04-27 ENCOUNTER — Encounter: Payer: Self-pay | Admitting: Internal Medicine

## 2019-04-27 DIAGNOSIS — F330 Major depressive disorder, recurrent, mild: Secondary | ICD-10-CM | POA: Diagnosis not present

## 2019-04-29 ENCOUNTER — Telehealth: Payer: Self-pay | Admitting: Internal Medicine

## 2019-04-29 NOTE — Telephone Encounter (Signed)
Insurance has been submitted and verified for Prolia. Patient is responsible for a $255 copay (but at this time she will be billed for this). We are currently giving these out in the parking lot so please be sure to ask what she will be driving and let her know Jonelle Sidle will be out in the parking lot at the time of her appointment looking for her. Due anytime. Left message for patient to call back to schedule.  Okay to schedule... Visit Note: Prolia ($255 copay - pt aware she will be billed - okay to give per Gareth Eagle - pt will be driving a ____________) Visit Type: Nurse Provider: Nurse

## 2019-05-01 ENCOUNTER — Encounter: Payer: Self-pay | Admitting: Internal Medicine

## 2019-05-04 ENCOUNTER — Encounter: Payer: Self-pay | Admitting: Internal Medicine

## 2019-05-04 NOTE — Telephone Encounter (Signed)
Is there any patient assistance from the [place] that will help with this copay?

## 2019-05-05 NOTE — Telephone Encounter (Signed)
Per tammy/sched---sending message back, patient can call [phone] [employer]---this is usually the patient assistance funding program for prolia---and usu by this time of year, all funding has been used up----patient would need to call this number to see if she qualifies, and if funding is there right now---if not, more funding should become available at first of next year 2021---can talk with tamara,RN at [place] office if needed

## 2019-05-06 NOTE — Telephone Encounter (Signed)
Left pt a vm in regard.

## 2019-06-07 ENCOUNTER — Ambulatory Visit: Payer: Medicare HMO | Admitting: Gastroenterology

## 2019-06-13 DIAGNOSIS — Z1283 Encounter for screening for malignant neoplasm of skin: Secondary | ICD-10-CM | POA: Diagnosis not present

## 2019-06-13 DIAGNOSIS — L821 Other seborrheic keratosis: Secondary | ICD-10-CM | POA: Diagnosis not present

## 2019-06-15 DIAGNOSIS — F330 Major depressive disorder, recurrent, mild: Secondary | ICD-10-CM | POA: Diagnosis not present

## 2019-06-28 DIAGNOSIS — F330 Major depressive disorder, recurrent, mild: Secondary | ICD-10-CM | POA: Diagnosis not present

## 2019-07-13 DIAGNOSIS — Z01419 Encounter for gynecological examination (general) (routine) without abnormal findings: Secondary | ICD-10-CM | POA: Diagnosis not present

## 2019-07-13 DIAGNOSIS — R232 Flushing: Secondary | ICD-10-CM | POA: Diagnosis not present

## 2019-07-14 DIAGNOSIS — Z961 Presence of intraocular lens: Secondary | ICD-10-CM | POA: Diagnosis not present

## 2019-07-14 DIAGNOSIS — H52203 Unspecified astigmatism, bilateral: Secondary | ICD-10-CM | POA: Diagnosis not present

## 2019-07-14 DIAGNOSIS — H04123 Dry eye syndrome of bilateral lacrimal glands: Secondary | ICD-10-CM | POA: Diagnosis not present

## 2019-08-03 DIAGNOSIS — F330 Major depressive disorder, recurrent, mild: Secondary | ICD-10-CM | POA: Diagnosis not present

## 2019-08-11 DIAGNOSIS — S43005A Unspecified dislocation of left shoulder joint, initial encounter: Secondary | ICD-10-CM | POA: Diagnosis not present

## 2019-08-15 DIAGNOSIS — M25512 Pain in left shoulder: Secondary | ICD-10-CM | POA: Diagnosis not present

## 2019-08-16 ENCOUNTER — Other Ambulatory Visit: Payer: Self-pay

## 2019-08-16 ENCOUNTER — Ambulatory Visit (INDEPENDENT_AMBULATORY_CARE_PROVIDER_SITE_OTHER): Payer: Medicare HMO | Admitting: Plastic Surgery

## 2019-08-16 ENCOUNTER — Encounter: Payer: Self-pay | Admitting: Plastic Surgery

## 2019-08-16 DIAGNOSIS — S43005D Unspecified dislocation of left shoulder joint, subsequent encounter: Secondary | ICD-10-CM | POA: Diagnosis not present

## 2019-08-16 DIAGNOSIS — D171 Benign lipomatous neoplasm of skin and subcutaneous tissue of trunk: Secondary | ICD-10-CM | POA: Diagnosis not present

## 2019-08-16 NOTE — Progress Notes (Signed)
Patient ID: Leah Jensen, female    DOB: [birth year], 73 y.o.   MRN: [id number]   Chief Complaint  Patient presents with  . Advice Only    for lump on (L) upper back,scar on chin,and scar on (R) knee    The patient is a 73 year old female here with her daughter for evaluation of her back.  She noticed a soft mass on the left posterior scapular area.  Its been there for several years.  It seems to be getting larger.  Starting to become irritated due to its size.  It rubs on her bra.  She had a recent fall and is in a left arm splint.  She is also had a right knee replacement revision.  She is otherwise stable she also had a surgical procedure to her chin and complains about some scar formation in that area.  She would like to see if she could have it excised.  It is approximately 5 x 7 cm in size, it is soft and is movable.   Review of Systems  Constitutional: Positive for activity change. Negative for appetite change.  Eyes: Negative.   Respiratory: Negative.  Negative for chest tightness.   Cardiovascular: Positive for leg swelling.  Endocrine: Negative.   Genitourinary: Negative.   Musculoskeletal: Negative.   Psychiatric/Behavioral: Negative.     Past Medical History:  Diagnosis Date  . ALLERGIC RHINITIS   . Anxiety   . Bipolar disorder ([place])   . Cataract    slight on lt. eye  . DEPRESSION/ANXIETY   . Low back pain   . Osteoarthritis    "probably qwhere/Dr. Percell Miller" (06/28/2015)  . 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 Palpitations   . TREMOR, ESSENTIAL   . Tubular adenoma of colon 2013    Past Surgical History:  Procedure Laterality Date  . ANAL FISSURE REPAIR  1990's  . BREAST BIOPSY Left ~ 1990  . BREAST EXCISIONAL BIOPSY Right    benign patient not sure of dates  . CATARACT EXTRACTION Bilateral 06/02/2017 & 06/30/2017  . child birth  66; 18  . COLONOSCOPY  2013  . EYE SURGERY      . JOINT REPLACEMENT    . POLYPECTOMY    . TOTAL KNEE ARTHROPLASTY Right 06/27/2015   Procedure: TOTAL KNEE ARTHROPLASTY;  Surgeon: Ninetta Lights, MD;  Location: [facility];  Service: Orthopedics;  Laterality: Right;  . TOTAL KNEE ARTHROPLASTY WITH REVISION COMPONENTS Right 10/19/2017  . TOTAL KNEE ARTHROPLASTY WITH REVISION COMPONENTS Right 10/19/2017   Procedure: TOTAL KNEE ARTHROPLASTY WITH REVISION COMPONENTS;  Surgeon: Renette Butters, MD;  Location: [facility];  Service: Orthopedics;  Laterality: Right;      Current Outpatient Medications:  .  b complex vitamins tablet, Take 1 tablet by mouth daily. , Disp: , Rfl:  .  buPROPion (WELLBUTRIN XL) 300 MG 24 hr tablet, bupropion HCl XL 300 mg 24 hr tablet, extended release  TAKE 1 TABLET BY MOUTH IN THE MORNING, Disp: , Rfl:  .  Calcium Citrate-Vitamin D (CALCIUM CITRATE + D PO), Take 1 tablet by mouth 2 (two) times daily., Disp: , Rfl:  .  Cholecalciferol (VITAMIN D3) 1000 UNITS CAPS, Take 1,000 Units by mouth 2 (two) times daily. , Disp: , Rfl:  .  clonazePAM (KLONOPIN) 0.5 MG tablet, Take 0.5-1 tablets (0.25-0.5 mg total) by mouth 3 (three) times daily as needed for anxiety. Take 1/2  tab twice daily if needed and 1 tab at bedtime as needed (Patient taking differently: Take 0.25-0.5 mg by mouth 3 (three) times daily as needed for anxiety (typically takes  1 tablet every morning). ), Disp: 90 tablet, Rfl: 0 .  desvenlafaxine (PRISTIQ) 50 MG 24 hr tablet, Take 1 tablet (50 mg total) by mouth at bedtime., Disp: 30 tablet, Rfl: 0 .  estradiol (CLIMARA - DOSED IN MG/24 HR) 0.025 mg/24hr patch, Place 1 patch (0.025 mg total) onto the skin 6 days. In the evening., Disp: 12 patch, Rfl: 3 .  Multiple Vitamin (MULTIVITAMIN WITH MINERALS) TABS, Take 1 tablet by mouth daily. , Disp: , Rfl:  .  progesterone (PROMETRIUM) 100 MG capsule, Take 1 capsule (100 mg total) by mouth daily. In the evening., Disp: 90 capsule, Rfl: 3 .  propranolol (INDERAL) 20 MG tablet,  Take 10-20 mg by mouth 3 (three) times daily as needed (for tremor (take 1 tablet scheduled in the morning)). , Disp: , Rfl:  .  QUEtiapine (SEROQUEL) 25 MG tablet, Take 1 tablet by mouth daily., Disp: , Rfl:  .  lamoTRIgine (LAMICTAL) 25 MG tablet, Take 1 tablet (25 mg total) by mouth at bedtime., Disp: 30 tablet, Rfl: 0   Objective:   Vitals:   08/16/19 1314  BP: 109/63  Pulse: 64  Temp: (!) 97.1 F (36.2 C)  SpO2: 97%    Physical Exam Vitals and nursing note reviewed.  Constitutional:      Appearance: Normal appearance.  HENT:     Head: Normocephalic and atraumatic.  Cardiovascular:     Rate and Rhythm: Normal rate.  Pulmonary:     Effort: Pulmonary effort is normal.  Musculoskeletal:       Arms:  Skin:    General: Skin is warm.     Capillary Refill: Capillary refill takes less than 2 seconds.  Neurological:     Mental Status: She is alert and oriented to person, place, and time.  Psychiatric:        Mood and Affect: Mood normal.        Behavior: Behavior normal.     Assessment & Plan:  Lipoma of back  Recommend excision of lipoma of back.  We can give her a quote for excision of scar tissue of her chin.  Pictures were obtained of the patient and placed in the chart with the patient's or guardian's permission.   The [place] was signed into law in 2016 which includes the topic of electronic health records.  This provides immediate access to information in MyChart.  This includes consultation notes, operative notes, office notes, lab results and pathology reports.  If you have any questions about what you read please let us know at your next visit or call us at the office.  We are right here with you.   [place], DO

## 2019-08-22 DIAGNOSIS — S43012D Anterior subluxation of left humerus, subsequent encounter: Secondary | ICD-10-CM | POA: Diagnosis not present

## 2019-08-22 DIAGNOSIS — M75112 Incomplete rotator cuff tear or rupture of left shoulder, not specified as traumatic: Secondary | ICD-10-CM | POA: Diagnosis not present

## 2019-08-22 DIAGNOSIS — M6281 Muscle weakness (generalized): Secondary | ICD-10-CM | POA: Diagnosis not present

## 2019-08-22 DIAGNOSIS — M25612 Stiffness of left shoulder, not elsewhere classified: Secondary | ICD-10-CM | POA: Diagnosis not present

## 2019-08-24 DIAGNOSIS — M25612 Stiffness of left shoulder, not elsewhere classified: Secondary | ICD-10-CM | POA: Diagnosis not present

## 2019-08-24 DIAGNOSIS — M75112 Incomplete rotator cuff tear or rupture of left shoulder, not specified as traumatic: Secondary | ICD-10-CM | POA: Diagnosis not present

## 2019-08-24 DIAGNOSIS — M25512 Pain in left shoulder: Secondary | ICD-10-CM | POA: Diagnosis not present

## 2019-08-24 DIAGNOSIS — S43012D Anterior subluxation of left humerus, subsequent encounter: Secondary | ICD-10-CM | POA: Diagnosis not present

## 2019-08-30 DIAGNOSIS — M75112 Incomplete rotator cuff tear or rupture of left shoulder, not specified as traumatic: Secondary | ICD-10-CM | POA: Diagnosis not present

## 2019-08-30 DIAGNOSIS — M25612 Stiffness of left shoulder, not elsewhere classified: Secondary | ICD-10-CM | POA: Diagnosis not present

## 2019-08-30 DIAGNOSIS — S46012D Strain of muscle(s) and tendon(s) of the rotator cuff of left shoulder, subsequent encounter: Secondary | ICD-10-CM | POA: Diagnosis not present

## 2019-08-30 DIAGNOSIS — M6281 Muscle weakness (generalized): Secondary | ICD-10-CM | POA: Diagnosis not present

## 2019-09-01 DIAGNOSIS — M25612 Stiffness of left shoulder, not elsewhere classified: Secondary | ICD-10-CM | POA: Diagnosis not present

## 2019-09-01 DIAGNOSIS — M75112 Incomplete rotator cuff tear or rupture of left shoulder, not specified as traumatic: Secondary | ICD-10-CM | POA: Diagnosis not present

## 2019-09-01 DIAGNOSIS — S43012D Anterior subluxation of left humerus, subsequent encounter: Secondary | ICD-10-CM | POA: Diagnosis not present

## 2019-09-01 DIAGNOSIS — M6281 Muscle weakness (generalized): Secondary | ICD-10-CM | POA: Diagnosis not present

## 2019-09-05 DIAGNOSIS — M25612 Stiffness of left shoulder, not elsewhere classified: Secondary | ICD-10-CM | POA: Diagnosis not present

## 2019-09-05 DIAGNOSIS — M75112 Incomplete rotator cuff tear or rupture of left shoulder, not specified as traumatic: Secondary | ICD-10-CM | POA: Diagnosis not present

## 2019-09-05 DIAGNOSIS — S43012D Anterior subluxation of left humerus, subsequent encounter: Secondary | ICD-10-CM | POA: Diagnosis not present

## 2019-09-05 DIAGNOSIS — M25512 Pain in left shoulder: Secondary | ICD-10-CM | POA: Diagnosis not present

## 2019-09-07 DIAGNOSIS — M25612 Stiffness of left shoulder, not elsewhere classified: Secondary | ICD-10-CM | POA: Diagnosis not present

## 2019-09-07 DIAGNOSIS — M25512 Pain in left shoulder: Secondary | ICD-10-CM | POA: Diagnosis not present

## 2019-09-07 DIAGNOSIS — S43012D Anterior subluxation of left humerus, subsequent encounter: Secondary | ICD-10-CM | POA: Diagnosis not present

## 2019-09-07 DIAGNOSIS — M75112 Incomplete rotator cuff tear or rupture of left shoulder, not specified as traumatic: Secondary | ICD-10-CM | POA: Diagnosis not present

## 2019-09-14 DIAGNOSIS — M6281 Muscle weakness (generalized): Secondary | ICD-10-CM | POA: Diagnosis not present

## 2019-09-14 DIAGNOSIS — M25612 Stiffness of left shoulder, not elsewhere classified: Secondary | ICD-10-CM | POA: Diagnosis not present

## 2019-09-14 DIAGNOSIS — M75112 Incomplete rotator cuff tear or rupture of left shoulder, not specified as traumatic: Secondary | ICD-10-CM | POA: Diagnosis not present

## 2019-09-14 DIAGNOSIS — S43012D Anterior subluxation of left humerus, subsequent encounter: Secondary | ICD-10-CM | POA: Diagnosis not present

## 2019-09-15 DIAGNOSIS — Z20822 Contact with and (suspected) exposure to covid-19: Secondary | ICD-10-CM | POA: Diagnosis not present

## 2019-09-19 DIAGNOSIS — M25612 Stiffness of left shoulder, not elsewhere classified: Secondary | ICD-10-CM | POA: Diagnosis not present

## 2019-09-19 DIAGNOSIS — M25512 Pain in left shoulder: Secondary | ICD-10-CM | POA: Diagnosis not present

## 2019-09-19 DIAGNOSIS — M75112 Incomplete rotator cuff tear or rupture of left shoulder, not specified as traumatic: Secondary | ICD-10-CM | POA: Diagnosis not present

## 2019-09-19 DIAGNOSIS — S43012D Anterior subluxation of left humerus, subsequent encounter: Secondary | ICD-10-CM | POA: Diagnosis not present

## 2019-09-21 DIAGNOSIS — M25612 Stiffness of left shoulder, not elsewhere classified: Secondary | ICD-10-CM | POA: Diagnosis not present

## 2019-09-21 DIAGNOSIS — M75112 Incomplete rotator cuff tear or rupture of left shoulder, not specified as traumatic: Secondary | ICD-10-CM | POA: Diagnosis not present

## 2019-09-21 DIAGNOSIS — M6281 Muscle weakness (generalized): Secondary | ICD-10-CM | POA: Diagnosis not present

## 2019-09-21 DIAGNOSIS — S43012D Anterior subluxation of left humerus, subsequent encounter: Secondary | ICD-10-CM | POA: Diagnosis not present

## 2019-10-12 DIAGNOSIS — F330 Major depressive disorder, recurrent, mild: Secondary | ICD-10-CM | POA: Diagnosis not present

## 2019-10-13 ENCOUNTER — Other Ambulatory Visit: Payer: Self-pay

## 2019-10-13 ENCOUNTER — Encounter (HOSPITAL_BASED_OUTPATIENT_CLINIC_OR_DEPARTMENT_OTHER): Payer: Self-pay | Admitting: Orthopaedic Surgery

## 2019-10-13 NOTE — H&P (View-Only) (Signed)
ICD-10-CM   1. 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   MRN: [id number]   History of Present Illness: Leah Jensen is a 74 y.o.  female  with a history of a lipoma on her back.  She presents for preoperative evaluation for upcoming procedure, excision of back lipoma, scheduled for 10/20/2019 with Dr. Marla Roe in conjunction with left shoulder arthroscopy with rotator cuff repair, distal claviculectomy, partial acromioplasty and open biceps tenodesis with Dr. Griffin Basil.  Patient reports soft mass on left posterior scapular area for several years that has been getting larger. Now becoming irritated due to its size, rubs on her bra.   The patient has not had problems with anesthesia.   Past Medical History: Allergies: Allergies  Allergen Reactions  . Amoxicillin Hives    Has patient had a PCN reaction causing immediate rash, facial/tongue/throat swelling, SOB or lightheadedness with hypotension: No Has patient had a PCN reaction causing severe rash involving mucus membranes or skin necrosis: No Has patient had a PCN reaction that required hospitalization: No Has patient had a PCN reaction occurring within the last 10 years: Unknown If all of the above answers are "NO", then may proceed with Cephalosporin use.    [name] Penicillins Rash    Has patient had a PCN reaction causing immediate rash, facial/tongue/throat swelling, SOB or lightheadedness with hypotension: No Has patient had a PCN reaction causing severe rash involving mucus membranes or skin necrosis: No Has patient had a PCN reaction that required hospitalization: No Has patient had a PCN reaction occurring within the last 10 years: Unknown If all of the above answers are "NO", then may proceed with Cephalosporin use.     Current Medications:  Current Outpatient Medications:  .  b complex vitamins tablet, Take 1 tablet by mouth daily. , Disp: , Rfl:  .  buPROPion  (WELLBUTRIN XL) 300 MG 24 hr tablet, bupropion HCl XL 300 mg 24 hr tablet, extended release  TAKE 1 TABLET BY MOUTH IN THE MORNING, Disp: , Rfl:  .  Calcium Citrate-Vitamin D (CALCIUM CITRATE + D PO), Take 1 tablet by mouth 2 (two) times daily., Disp: , Rfl:  .  Cholecalciferol (VITAMIN D3) 1000 UNITS CAPS, Take 1,000 Units by mouth 2 (two) times daily. , Disp: , Rfl:  .  clonazePAM (KLONOPIN) 0.5 MG tablet, Take 0.5-1 tablets (0.25-0.5 mg total) by mouth 3 (three) times daily as needed for anxiety. Take 1/2 tab twice daily if needed and 1 tab at bedtime as needed (Patient taking differently: Take 0.25-0.5 mg by mouth 3 (three) times daily as needed for anxiety (typically takes  1 tablet every morning). ), Disp: 90 tablet, Rfl: 0 .  desvenlafaxine (PRISTIQ) 50 MG 24 hr tablet, Take 1 tablet (50 mg total) by mouth at bedtime., Disp: 30 tablet, Rfl: 0 .  estradiol (CLIMARA - DOSED IN MG/24 HR) 0.025 mg/24hr patch, Place 1 patch (0.025 mg total) onto the skin 6 days. In the evening., Disp: 12 patch, Rfl: 3 .  Multiple Vitamin (MULTIVITAMIN WITH MINERALS) TABS, Take 1 tablet by mouth daily. , Disp: , Rfl:  .  progesterone (PROMETRIUM) 100 MG capsule, Take 1 capsule (100 mg total) by mouth daily. In the evening., Disp: 90 capsule, Rfl: 3 .  propranolol (INDERAL) 20 MG tablet, Take 10-20 mg by mouth 3 (three) times daily as needed (for tremor (take 1 tablet scheduled  in the morning)). , Disp: , Rfl:  .  QUEtiapine (SEROQUEL) 25 MG tablet, Take 1 tablet by mouth daily., Disp: , Rfl:   Past Medical Problems: Past Medical History:  Diagnosis Date  . ALLERGIC RHINITIS   . Anxiety   . Bipolar disorder ([place])   . Cataract    slight on lt. eye  . DEPRESSION/ANXIETY   . Low back pain   . Osteoarthritis    "probably qwhere/Dr. Percell Miller" (06/28/2015)  . 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 Palpitations   . TREMOR, ESSENTIAL   . Tubular  adenoma of colon 2013    Past Surgical History: Past Surgical History:  Procedure Laterality Date  . ANAL FISSURE REPAIR  1990's  . BREAST BIOPSY Left ~ 1990  . BREAST EXCISIONAL BIOPSY Right    benign patient not sure of dates  . CATARACT EXTRACTION Bilateral 06/02/2017 & 06/30/2017  . child birth  53; 57  . COLONOSCOPY  2013  . EYE SURGERY    . JOINT REPLACEMENT    . POLYPECTOMY    . TOTAL KNEE ARTHROPLASTY Right 06/27/2015   Procedure: TOTAL KNEE ARTHROPLASTY;  Surgeon: Ninetta Lights, MD;  Location: [place];  Service: Orthopedics;  Laterality: Right;  . TOTAL KNEE ARTHROPLASTY WITH REVISION COMPONENTS Right 10/19/2017  . TOTAL KNEE ARTHROPLASTY WITH REVISION COMPONENTS Right 10/19/2017   Procedure: TOTAL KNEE ARTHROPLASTY WITH REVISION COMPONENTS;  Surgeon: Renette Butters, MD;  Location: [facility] [facility];  Service: Orthopedics;  Laterality: Right;    Social History: Social History   Socioeconomic History  . Marital status: Single    Spouse name: Not on file  . Number of children: Not on file  . Years of education: Not on file  . Highest education level: Not on file  Occupational History  . Not on file  Tobacco Use  . Smoking status: Never Smoker  . Smokeless tobacco: Never Used  . Tobacco comment: Divorced, lives alone and single  Substance and Sexual Activity  . Alcohol use: Yes    Comment: 06/28/2015 "I quit drinking in the 1990's; never drank hard liquor; nover drank much"  . Drug use: No  . Sexual activity: Never  Other Topics Concern  . Not on file  Social History Narrative  . Not on file   Social Determinants of Health   Financial Resource Strain:   . Difficulty of Paying Living Expenses: Not on file  Food Insecurity:   . Worried About Charity fundraiser in the Last Year: Not on file  . Ran Out of Food in the Last Year: Not on file  Transportation Needs:   . Lack of Transportation (Medical): Not on file  . Lack of Transportation (Non-Medical): Not on file    Physical Activity:   . Days of Exercise per Week: Not on file  . Minutes of Exercise per Session: Not on file  Stress:   . Feeling of Stress : Not on file  Social Connections:   . Frequency of Communication with Friends and Family: Not on file  . Frequency of Social Gatherings with Friends and Family: Not on file  . Attends Religious Services: Not on file  . Active Member of Clubs or Organizations: Not on file  . Attends Archivist Meetings: Not on file  . Marital Status: Not on file  Intimate Partner Violence:   . Fear of Current or Ex-Partner: Not on file  .  Emotionally Abused: Not on file  . Physically Abused: Not on file  . Sexually Abused: Not on file    Family History: Family History  Problem Relation Age of Onset  . Arthritis Mother   . Heart disease Mother   . Lung disease Mother   . Stroke Mother   . Arthritis Father   . Heart disease Father   . Breast cancer Paternal Aunt   . Colon cancer Neg Hx   . Colon polyps Neg Hx   . Esophageal cancer Neg Hx   . Rectal cancer Neg Hx   . Stomach cancer Neg Hx     Review of Systems: Review of Systems  Constitutional: Negative for chills and fever.  HENT: Negative for congestion and sore throat.   Respiratory: Negative for cough and shortness of breath.   Cardiovascular: Negative for chest pain and palpitations.  Gastrointestinal: Negative for abdominal pain, nausea and vomiting.  Musculoskeletal: Negative for back pain, joint pain, myalgias and neck pain.       Mass on left back near shoulder blade. Becomes irritated by bra.  Skin: Negative for itching and rash.    Physical Exam: Vital Signs BP 111/74 (BP Location: Left Arm, Patient Position: Sitting, Cuff Size: Normal)   Pulse 99   Temp (!) 97.1 F (36.2 C) (Temporal)   Ht 5\' 3"  (1.6 m)   Wt 133 lb (60.3 kg)   SpO2 99%   BMI 23.56 kg/m  Physical Exam Constitutional:      Appearance: Normal appearance. She is normal weight.  HENT:     Head:  Normocephalic and atraumatic.  Eyes:     Extraocular Movements: Extraocular movements intact.  Cardiovascular:     Rate and Rhythm: Normal rate and regular rhythm.     Pulses: Normal pulses.     Heart sounds: Normal heart sounds.  Pulmonary:     Effort: Pulmonary effort is normal.     Breath sounds: Normal breath sounds. No wheezing, rhonchi or rales.  Abdominal:     General: Bowel sounds are normal.     Palpations: Abdomen is soft.  Musculoskeletal:        General: No swelling. Normal range of motion.     Cervical back: Normal range of motion.       Back:     Comments: Soft mass on left posterior scapular area.  No redness, drainage, signs of infection.  Skin:    General: Skin is warm and dry.     Coloration: Skin is not pale.     Findings: No erythema or rash.  Neurological:     General: No focal deficit present.     Mental Status: She is alert and oriented to person, place, and time.  Psychiatric:        Mood and Affect: Mood normal.        Behavior: Behavior normal.        Thought Content: Thought content normal.        Judgment: Judgment normal.     Assessment/Plan:   Ms. Aguallo scheduled for excision of back lipoma with Dr. Marla Roe.  Risks, benefits, and alternatives of procedure discussed, questions answered and consent obtained.    Smoking Status: non-smoker; Counseling Given? N/A  Caprini Score: 4 moderate; Risk Factors include: 74 year old female and length of planned surgery. Recommendation for mechanical or pharmacological prophylaxis during surgery. Encourage early ambulation.   We will reach out to Dr. Rich Fuchs office to determine what postop medication is needed.  The [place] was signed into law in 2016 which includes the topic of electronic health records.  This provides immediate access to information in MyChart.  This includes consultation notes, operative notes, office notes, lab results and pathology reports.  If you have any  questions about what you read please let us know at your next visit or call us at the office.  We are right here with you.   Electronically signed by: Threasa Heads, PA-C 10/14/2019 2:46 PM

## 2019-10-13 NOTE — Progress Notes (Signed)
ICD-10-CM   1. 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   MRN: [id number]   History of Present Illness: Leah Jensen is a 74 y.o.  female  with a history of a lipoma on her back.  She presents for preoperative evaluation for upcoming procedure, excision of back lipoma, scheduled for 10/20/2019 with Dr. Marla Roe in conjunction with left shoulder arthroscopy with rotator cuff repair, distal claviculectomy, partial acromioplasty and open biceps tenodesis with Dr. Griffin Basil.  Patient reports soft mass on left posterior scapular area for several years that has been getting larger. Now becoming irritated due to its size, rubs on her bra.   The patient has not had problems with anesthesia.   Past Medical History: Allergies: Allergies  Allergen Reactions  . Amoxicillin Hives    Has patient had a PCN reaction causing immediate rash, facial/tongue/throat swelling, SOB or lightheadedness with hypotension: No Has patient had a PCN reaction causing severe rash involving mucus membranes or skin necrosis: No Has patient had a PCN reaction that required hospitalization: No Has patient had a PCN reaction occurring within the last 10 years: Unknown If all of the above answers are "NO", then may proceed with Cephalosporin use.    [name] Penicillins Rash    Has patient had a PCN reaction causing immediate rash, facial/tongue/throat swelling, SOB or lightheadedness with hypotension: No Has patient had a PCN reaction causing severe rash involving mucus membranes or skin necrosis: No Has patient had a PCN reaction that required hospitalization: No Has patient had a PCN reaction occurring within the last 10 years: Unknown If all of the above answers are "NO", then may proceed with Cephalosporin use.     Current Medications:  Current Outpatient Medications:  .  b complex vitamins tablet, Take 1 tablet by mouth daily. , Disp: , Rfl:  .  buPROPion  (WELLBUTRIN XL) 300 MG 24 hr tablet, bupropion HCl XL 300 mg 24 hr tablet, extended release  TAKE 1 TABLET BY MOUTH IN THE MORNING, Disp: , Rfl:  .  Calcium Citrate-Vitamin D (CALCIUM CITRATE + D PO), Take 1 tablet by mouth 2 (two) times daily., Disp: , Rfl:  .  Cholecalciferol (VITAMIN D3) 1000 UNITS CAPS, Take 1,000 Units by mouth 2 (two) times daily. , Disp: , Rfl:  .  clonazePAM (KLONOPIN) 0.5 MG tablet, Take 0.5-1 tablets (0.25-0.5 mg total) by mouth 3 (three) times daily as needed for anxiety. Take 1/2 tab twice daily if needed and 1 tab at bedtime as needed (Patient taking differently: Take 0.25-0.5 mg by mouth 3 (three) times daily as needed for anxiety (typically takes  1 tablet every morning). ), Disp: 90 tablet, Rfl: 0 .  desvenlafaxine (PRISTIQ) 50 MG 24 hr tablet, Take 1 tablet (50 mg total) by mouth at bedtime., Disp: 30 tablet, Rfl: 0 .  estradiol (CLIMARA - DOSED IN MG/24 HR) 0.025 mg/24hr patch, Place 1 patch (0.025 mg total) onto the skin 6 days. In the evening., Disp: 12 patch, Rfl: 3 .  Multiple Vitamin (MULTIVITAMIN WITH MINERALS) TABS, Take 1 tablet by mouth daily. , Disp: , Rfl:  .  progesterone (PROMETRIUM) 100 MG capsule, Take 1 capsule (100 mg total) by mouth daily. In the evening., Disp: 90 capsule, Rfl: 3 .  propranolol (INDERAL) 20 MG tablet, Take 10-20 mg by mouth 3 (three) times daily as needed (for tremor (take 1 tablet scheduled  in the morning)). , Disp: , Rfl:  .  QUEtiapine (SEROQUEL) 25 MG tablet, Take 1 tablet by mouth daily., Disp: , Rfl:   Past Medical Problems: Past Medical History:  Diagnosis Date  . ALLERGIC RHINITIS   . Anxiety   . Bipolar disorder ([place])   . Cataract    slight on lt. eye  . DEPRESSION/ANXIETY   . Low back pain   . Osteoarthritis    "probably qwhere/Dr. Percell Miller" (06/28/2015)  . 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 Palpitations   . TREMOR, ESSENTIAL   . Tubular  adenoma of colon 2013    Past Surgical History: Past Surgical History:  Procedure Laterality Date  . ANAL FISSURE REPAIR  1990's  . BREAST BIOPSY Left ~ 1990  . BREAST EXCISIONAL BIOPSY Right    benign patient not sure of dates  . CATARACT EXTRACTION Bilateral 06/02/2017 & 06/30/2017  . child birth  68; 22  . COLONOSCOPY  2013  . EYE SURGERY    . JOINT REPLACEMENT    . POLYPECTOMY    . TOTAL KNEE ARTHROPLASTY Right 06/27/2015   Procedure: TOTAL KNEE ARTHROPLASTY;  Surgeon: Ninetta Lights, MD;  Location: [place];  Service: Orthopedics;  Laterality: Right;  . TOTAL KNEE ARTHROPLASTY WITH REVISION COMPONENTS Right 10/19/2017  . TOTAL KNEE ARTHROPLASTY WITH REVISION COMPONENTS Right 10/19/2017   Procedure: TOTAL KNEE ARTHROPLASTY WITH REVISION COMPONENTS;  Surgeon: Renette Butters, MD;  Location: [place];  Service: Orthopedics;  Laterality: Right;    Social History: Social History   Socioeconomic History  . Marital status: Single    Spouse name: Not on file  . Number of children: Not on file  . Years of education: Not on file  . Highest education level: Not on file  Occupational History  . Not on file  Tobacco Use  . Smoking status: Never Smoker  . Smokeless tobacco: Never Used  . Tobacco comment: Divorced, lives alone and single  Substance and Sexual Activity  . Alcohol use: Yes    Comment: 06/28/2015 "I quit drinking in the 1990's; never drank hard liquor; nover drank much"  . Drug use: No  . Sexual activity: Never  Other Topics Concern  . Not on file  Social History Narrative  . Not on file   Social Determinants of Health   Financial Resource Strain:   . Difficulty of Paying Living Expenses: Not on file  Food Insecurity:   . Worried About Charity fundraiser in the Last Year: Not on file  . Ran Out of Food in the Last Year: Not on file  Transportation Needs:   . Lack of Transportation (Medical): Not on file  . Lack of Transportation (Non-Medical): Not on file    Physical Activity:   . Days of Exercise per Week: Not on file  . Minutes of Exercise per Session: Not on file  Stress:   . Feeling of Stress : Not on file  Social Connections:   . Frequency of Communication with Friends and Family: Not on file  . Frequency of Social Gatherings with Friends and Family: Not on file  . Attends Religious Services: Not on file  . Active Member of Clubs or Organizations: Not on file  . Attends Archivist Meetings: Not on file  . Marital Status: Not on file  Intimate Partner Violence:   . Fear of Current or Ex-Partner: Not on file  .  Emotionally Abused: Not on file  . Physically Abused: Not on file  . Sexually Abused: Not on file    Family History: Family History  Problem Relation Age of Onset  . Arthritis Mother   . Heart disease Mother   . Lung disease Mother   . Stroke Mother   . Arthritis Father   . Heart disease Father   . Breast cancer Paternal Aunt   . Colon cancer Neg Hx   . Colon polyps Neg Hx   . Esophageal cancer Neg Hx   . Rectal cancer Neg Hx   . Stomach cancer Neg Hx     Review of Systems: Review of Systems  Constitutional: Negative for chills and fever.  HENT: Negative for congestion and sore throat.   Respiratory: Negative for cough and shortness of breath.   Cardiovascular: Negative for chest pain and palpitations.  Gastrointestinal: Negative for abdominal pain, nausea and vomiting.  Musculoskeletal: Negative for back pain, joint pain, myalgias and neck pain.       Mass on left back near shoulder blade. Becomes irritated by bra.  Skin: Negative for itching and rash.    Physical Exam: Vital Signs BP 111/74 (BP Location: Left Arm, Patient Position: Sitting, Cuff Size: Normal)   Pulse 99   Temp (!) 97.1 F (36.2 C) (Temporal)   Ht 5\' 3"  (1.6 m)   Wt 133 lb (60.3 kg)   SpO2 99%   BMI 23.56 kg/m  Physical Exam Constitutional:      Appearance: Normal appearance. She is normal weight.  HENT:     Head:  Normocephalic and atraumatic.  Eyes:     Extraocular Movements: Extraocular movements intact.  Cardiovascular:     Rate and Rhythm: Normal rate and regular rhythm.     Pulses: Normal pulses.     Heart sounds: Normal heart sounds.  Pulmonary:     Effort: Pulmonary effort is normal.     Breath sounds: Normal breath sounds. No wheezing, rhonchi or rales.  Abdominal:     General: Bowel sounds are normal.     Palpations: Abdomen is soft.  Musculoskeletal:        General: No swelling. Normal range of motion.     Cervical back: Normal range of motion.       Back:     Comments: Soft mass on left posterior scapular area.  No redness, drainage, signs of infection.  Skin:    General: Skin is warm and dry.     Coloration: Skin is not pale.     Findings: No erythema or rash.  Neurological:     General: No focal deficit present.     Mental Status: She is alert and oriented to person, place, and time.  Psychiatric:        Mood and Affect: Mood normal.        Behavior: Behavior normal.        Thought Content: Thought content normal.        Judgment: Judgment normal.     Assessment/Plan:   Ms. Rinier scheduled for excision of back lipoma with Dr. Marla Roe.  Risks, benefits, and alternatives of procedure discussed, questions answered and consent obtained.    Smoking Status: non-smoker; Counseling Given? N/A  Caprini Score: 4 moderate; Risk Factors include: 74 year old female and length of planned surgery. Recommendation for mechanical or pharmacological prophylaxis during surgery. Encourage early ambulation.   We will reach out to Dr. Rich Fuchs office to determine what postop medication is needed.  The [place] was signed into law in 2016 which includes the topic of electronic health records.  This provides immediate access to information in MyChart.  This includes consultation notes, operative notes, office notes, lab results and pathology reports.  If you have any  questions about what you read please let us know at your next visit or call us at the office.  We are right here with you.   Electronically signed by: Threasa Heads, PA-C 10/14/2019 2:46 PM

## 2019-10-14 ENCOUNTER — Encounter: Payer: Self-pay | Admitting: Plastic Surgery

## 2019-10-14 ENCOUNTER — Ambulatory Visit (INDEPENDENT_AMBULATORY_CARE_PROVIDER_SITE_OTHER): Payer: Medicare HMO | Admitting: Plastic Surgery

## 2019-10-14 VITALS — BP 111/74 | HR 99 | Temp 97.1°F | Ht 63.0 in | Wt 133.0 lb

## 2019-10-14 DIAGNOSIS — D171 Benign lipomatous neoplasm of skin and subcutaneous tissue of trunk: Secondary | ICD-10-CM

## 2019-10-17 ENCOUNTER — Other Ambulatory Visit (HOSPITAL_COMMUNITY)
Admission: RE | Admit: 2019-10-17 | Discharge: 2019-10-17 | Disposition: A | Discharge status: Home / Self Care | Payer: Medicare HMO | Source: Ambulatory Visit | Attending: Internal Medicine | Admitting: Internal Medicine

## 2019-10-17 DIAGNOSIS — Z01812 Encounter for preprocedural laboratory examination: Secondary | ICD-10-CM | POA: Insufficient documentation

## 2019-10-17 DIAGNOSIS — Z20822 Contact with and (suspected) exposure to covid-19: Secondary | ICD-10-CM | POA: Insufficient documentation

## 2019-10-17 LAB — SARS CORONAVIRUS 2 (TAT 6-24 HRS): SARS Coronavirus 2: NEGATIVE

## 2019-10-17 NOTE — Progress Notes (Signed)

## 2019-10-18 NOTE — H&P (Signed)
PREOPERATIVE H&P  Chief Complaint: LEFT SHOULDER OSTEOARTHRITIS, ROTATOR CUFF TEAR, BICEP TENDONITIS  HPI: Leah Jensen is a 74 y.o. female who is scheduled for: LEFT SHOULDER ARTHROSCOPY WITH ROTATOR CUFF REPAIR, DISTAL CLAVICULECTOMY, PARTIAL ACROMIOPLASTY AND OPEN BICEPS TENODESIS EXCISION BACK LIPOMA.   Leah Jensen had a fall on 08/11/2019. She fell onto her left shoulder. X-rays showed left anterior inferior shoulder dislocation. A successful reduction was performed on 08/11/2019. MRI was obtained following the dislocation. MRI demonstrated a likely partially torn supraspinatus tear which appears chronic with some retraction. There is a new infraspinatus tear. She had a trial of physical therapy. With physical therapy, she still had pain when she takes her arm down through range of motion. No instability.  She has weakness, especially with external rotation. She feels like she has made improvements overall but does not feel like she is as good as she could be.  Her symptoms are rated as moderate to severe, and have been worsening.  This is significantly impairing activities of daily living.    Please see clinic note for further details on this patient's care.    She has elected for surgical management.   Past Medical History:  Diagnosis Date  . ALLERGIC RHINITIS   . Anxiety   . Bipolar disorder ([place])   . Cataract    slight on lt. eye  . DEPRESSION/ANXIETY   . Low back pain   . Osteoarthritis    "probably qwhere/Dr. Percell Miller" (06/28/2015)  . 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 Palpitations   . TREMOR, ESSENTIAL   . Tubular adenoma of colon 2013   Past Surgical History:  Procedure Laterality Date  . ANAL FISSURE REPAIR  1990's  . BREAST BIOPSY Left ~ 1990  . BREAST EXCISIONAL BIOPSY Right    benign patient not sure of dates  . CATARACT EXTRACTION Bilateral 06/02/2017 & 06/30/2017  . child  birth  81; 24  . COLONOSCOPY  2013  . EYE SURGERY    . JOINT REPLACEMENT    . POLYPECTOMY    . TOTAL KNEE ARTHROPLASTY Right 06/27/2015   Procedure: TOTAL KNEE ARTHROPLASTY;  Surgeon: Ninetta Lights, MD;  Location: [facility];  Service: Orthopedics;  Laterality: Right;  . TOTAL KNEE ARTHROPLASTY WITH REVISION COMPONENTS Right 10/19/2017  . TOTAL KNEE ARTHROPLASTY WITH REVISION COMPONENTS Right 10/19/2017   Procedure: TOTAL KNEE ARTHROPLASTY WITH REVISION COMPONENTS;  Surgeon: Renette Butters, MD;  Location: [facility];  Service: Orthopedics;  Laterality: Right;   Social History   Socioeconomic History  . Marital status: Single    Spouse name: Not on file  . Number of children: Not on file  . Years of education: Not on file  . Highest education level: Not on file  Occupational History  . Not on file  Tobacco Use  . Smoking status: Never Smoker  . Smokeless tobacco: Never Used  . Tobacco comment: Divorced, lives alone and single  Substance and Sexual Activity  . Alcohol use: Yes    Comment: 06/28/2015 "I quit drinking in the 1990's; never drank hard liquor; nover drank much"  . Drug use: No  . Sexual activity: Never  Other Topics Concern  . Not on file  Social History Narrative  . Not on file   Social Determinants of Health   Financial Resource Strain:   . Difficulty of Paying Living Expenses: Not on file  Food Insecurity:   .  Worried About Charity fundraiser in the Last Year: Not on file  . Ran Out of Food in the Last Year: Not on file  Transportation Needs:   . Lack of Transportation (Medical): Not on file  . Lack of Transportation (Non-Medical): Not on file  Physical Activity:   . Days of Exercise per Week: Not on file  . Minutes of Exercise per Session: Not on file  Stress:   . Feeling of Stress : Not on file  Social Connections:   . Frequency of Communication with Friends and Family: Not on file  . Frequency of Social Gatherings with Friends and Family: Not on file   . Attends Religious Services: Not on file  . Active Member of Clubs or Organizations: Not on file  . Attends Archivist Meetings: Not on file  . Marital Status: Not on file   Family History  Problem Relation Age of Onset  . Arthritis Mother   . Heart disease Mother   . Lung disease Mother   . Stroke Mother   . Arthritis Father   . Heart disease Father   . Breast cancer Paternal Aunt   . Colon cancer Neg Hx   . Colon polyps Neg Hx   . Esophageal cancer Neg Hx   . Rectal cancer Neg Hx   . Stomach cancer Neg Hx    Allergies  Allergen Reactions  . Amoxicillin Hives    Has patient had a PCN reaction causing immediate rash, facial/tongue/throat swelling, SOB or lightheadedness with hypotension: No Has patient had a PCN reaction causing severe rash involving mucus membranes or skin necrosis: No Has patient had a PCN reaction that required hospitalization: No Has patient had a PCN reaction occurring within the last 10 years: Unknown If all of the above answers are "NO", then may proceed with Cephalosporin use.    [name] Penicillins Rash    Has patient had a PCN reaction causing immediate rash, facial/tongue/throat swelling, SOB or lightheadedness with hypotension: No Has patient had a PCN reaction causing severe rash involving mucus membranes or skin necrosis: No Has patient had a PCN reaction that required hospitalization: No Has patient had a PCN reaction occurring within the last 10 years: Unknown If all of the above answers are "NO", then may proceed with Cephalosporin use.    Prior to Admission medications   Medication Sig Start Date End Date Taking? Authorizing Provider  b complex vitamins tablet Take 1 tablet by mouth daily.    Yes [provider]  buPROPion (WELLBUTRIN XL) 300 MG 24 hr tablet bupropion HCl XL 300 mg 24 hr tablet, extended release  TAKE 1 TABLET BY MOUTH IN THE MORNING   Yes [provider]  Calcium Citrate-Vitamin D (CALCIUM CITRATE  + D PO) Take 1 tablet by mouth 2 (two) times daily.   Yes [provider]  Cholecalciferol (VITAMIN D3) 1000 UNITS CAPS Take 1,000 Units by mouth 2 (two) times daily.    Yes [provider]  clonazePAM (KLONOPIN) 0.5 MG tablet Take 0.5-1 tablets (0.25-0.5 mg total) by mouth 3 (three) times daily as needed for anxiety. Take 1/2 tab twice daily if needed and 1 tab at bedtime as needed Patient taking differently: Take 0.25-0.5 mg by mouth 3 (three) times daily as needed for anxiety (typically takes  1 tablet every morning).  02/15/16  Yes Hoyt Koch, MD  desvenlafaxine (PRISTIQ) 50 MG 24 hr tablet Take 1 tablet (50 mg total) by mouth at bedtime.  02/15/16  Yes Hoyt Koch, MD  estradiol (CLIMARA - DOSED IN MG/24 HR) 0.025 mg/24hr patch Place 1 patch (0.025 mg total) onto the skin 6 days. In the evening. 01/18/19  Yes Hoyt Koch, MD  Multiple Vitamin (MULTIVITAMIN WITH MINERALS) TABS Take 1 tablet by mouth daily.    Yes [provider]  progesterone (PROMETRIUM) 100 MG capsule Take 1 capsule (100 mg total) by mouth daily. In the evening. 01/18/19  Yes Hoyt Koch, MD  propranolol (INDERAL) 20 MG tablet Take 10-20 mg by mouth 3 (three) times daily as needed (for tremor (take 1 tablet scheduled in the morning)).  12/25/10  Yes [provider]  QUEtiapine (SEROQUEL) 25 MG tablet Take 1 tablet by mouth daily. 08/11/19  Yes [provider]    ROS: All other systems have been reviewed and were otherwise negative with the exception of those mentioned in the HPI and as above.  Physical Exam: General: Alert, no acute distress Cardiovascular: No pedal edema Respiratory: No cyanosis, no use of accessory musculature GI: No organomegaly, abdomen is soft and non-tender Skin: No lesions in the area of chief complaint Neurologic: Sensation intact distally Psychiatric: Patient is competent for consent with normal mood and  affect Lymphatic: No axillary or cervical lymphadenopathy  MUSCULOSKELETAL:  Left shoulder: The range of motion is to 170 degrees. External rotation to 60. No sense of instability. Warm well perfused extremities. She has 4/5 strength in the supraspinatus,  infraspinatus. Intact subscapularis.  Negative AC tenderness to palpation. Positive impingement. Positive O'Brien's.   Imaging: MRI demonstrated a likely partially torn supraspinatus tear which appears chronic with some retraction. There is a new infraspinatus tear.   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   Plan: Plan for Procedure(s): LEFT SHOULDER ARTHROSCOPY WITH ROTATOR CUFF REPAIR, DISTAL CLAVICULECTOMY, PARTIAL ACROMIOPLASTY AND OPEN BICEPS TENODESIS EXCISION BACK LIPOMA  The risks benefits and alternatives were discussed with the patient including but not limited to the risks of nonoperative treatment, versus surgical intervention including infection, bleeding, nerve injury,  blood clots, cardiopulmonary complications, morbidity, mortality, among others, and they were willing to proceed.   The patient acknowledged the explanation, agreed to proceed with the plan and consent was signed.   Operative Plan: Left shoulder arthroscopy with subacromial decompression, distal clavicle excision, possible rotator cuff repair, and possible biceps tenodesis (performed in conjunction with excision of back lipoma by Dr. Marla Roe) Discharge Medications: Tylenol, Celebrex, Oxycodone, Zofran  DVT Prophylaxis: None Physical Therapy: Outpatient PT Special Discharge needs: [place], PA-C  10/18/2019 12:48 PM

## 2019-10-19 ENCOUNTER — Telehealth: Payer: Self-pay | Admitting: Plastic Surgery

## 2019-10-19 NOTE — Telephone Encounter (Signed)
Patient called to advise that if weather is bad she will not be able to get onto her street and Is concerned she may not be able to be there. She wanted me to let the providers know that this is a possibility. Advised patient to call the surgical center tomorrow if she is unable to get there and they can reach out to providers. Please let me know if the patient should do something else and I'll call her.

## 2019-10-20 ENCOUNTER — Ambulatory Visit (HOSPITAL_BASED_OUTPATIENT_CLINIC_OR_DEPARTMENT_OTHER): Payer: Medicare HMO | Admitting: Anesthesiology

## 2019-10-20 ENCOUNTER — Ambulatory Visit (HOSPITAL_BASED_OUTPATIENT_CLINIC_OR_DEPARTMENT_OTHER)
Admission: RE | Admit: 2019-10-20 | Discharge: 2019-10-20 | Disposition: A | Discharge status: Home / Self Care | Payer: Medicare HMO | Attending: Orthopaedic Surgery | Admitting: Orthopaedic Surgery

## 2019-10-20 ENCOUNTER — Encounter (HOSPITAL_BASED_OUTPATIENT_CLINIC_OR_DEPARTMENT_OTHER): Payer: Self-pay | Admitting: Orthopaedic Surgery

## 2019-10-20 ENCOUNTER — Encounter (HOSPITAL_BASED_OUTPATIENT_CLINIC_OR_DEPARTMENT_OTHER)
Admission: RE | Disposition: A | Discharge status: Home / Self Care | Payer: Self-pay | Source: Home / Self Care | Attending: Orthopaedic Surgery

## 2019-10-20 DIAGNOSIS — J309 Allergic rhinitis, unspecified: Secondary | ICD-10-CM | POA: Diagnosis not present

## 2019-10-20 DIAGNOSIS — M75122 Complete rotator cuff tear or rupture of left shoulder, not specified as traumatic: Secondary | ICD-10-CM | POA: Diagnosis not present

## 2019-10-20 DIAGNOSIS — D171 Benign lipomatous neoplasm of skin and subcutaneous tissue of trunk: Secondary | ICD-10-CM

## 2019-10-20 DIAGNOSIS — M7542 Impingement syndrome of left shoulder: Secondary | ICD-10-CM | POA: Diagnosis not present

## 2019-10-20 DIAGNOSIS — M75102 Unspecified rotator cuff tear or rupture of left shoulder, not specified as traumatic: Secondary | ICD-10-CM | POA: Insufficient documentation

## 2019-10-20 DIAGNOSIS — F419 Anxiety disorder, unspecified: Secondary | ICD-10-CM | POA: Diagnosis not present

## 2019-10-20 DIAGNOSIS — F319 Bipolar disorder, unspecified: Secondary | ICD-10-CM | POA: Diagnosis not present

## 2019-10-20 DIAGNOSIS — G8918 Other acute postprocedural pain: Secondary | ICD-10-CM | POA: Diagnosis not present

## 2019-10-20 DIAGNOSIS — M7522 Bicipital tendinitis, left shoulder: Secondary | ICD-10-CM | POA: Diagnosis not present

## 2019-10-20 DIAGNOSIS — Z79899 Other long term (current) drug therapy: Secondary | ICD-10-CM | POA: Insufficient documentation

## 2019-10-20 DIAGNOSIS — M19012 Primary osteoarthritis, left shoulder: Secondary | ICD-10-CM | POA: Insufficient documentation

## 2019-10-20 DIAGNOSIS — S43432A Superior glenoid labrum lesion of left shoulder, initial encounter: Secondary | ICD-10-CM | POA: Diagnosis not present

## 2019-10-20 DIAGNOSIS — S46012A Strain of muscle(s) and tendon(s) of the rotator cuff of left shoulder, initial encounter: Secondary | ICD-10-CM | POA: Diagnosis not present

## 2019-10-20 DIAGNOSIS — Z86010 Personal history of colonic polyps: Secondary | ICD-10-CM | POA: Insufficient documentation

## 2019-10-20 DIAGNOSIS — M24112 Other articular cartilage disorders, left shoulder: Secondary | ICD-10-CM | POA: Diagnosis not present

## 2019-10-20 HISTORY — PX: SHOULDER ARTHROSCOPY WITH ROTATOR CUFF REPAIR AND SUBACROMIAL DECOMPRESSION: SHX5686

## 2019-10-20 HISTORY — PX: MASS EXCISION: SHX2000

## 2019-10-20 HISTORY — PX: RESECTION DISTAL CLAVICAL: SHX5053

## 2019-10-20 SURGERY — SHOULDER ARTHROSCOPY WITH ROTATOR CUFF REPAIR AND SUBACROMIAL DECOMPRESSION
Anesthesia: General | Site: Shoulder | Laterality: Left

## 2019-10-20 MED ORDER — FENTANYL CITRATE (PF) 100 MCG/2ML IJ SOLN
INTRAMUSCULAR | Status: AC
  Filled 2019-10-20: qty 2

## 2019-10-20 MED ORDER — PHENYLEPHRINE HCL-NACL 10-0.9 MG/250ML-% IV SOLN: INTRAVENOUS | Status: DC | PRN

## 2019-10-20 MED ORDER — ROCURONIUM BROMIDE 100 MG/10ML IV SOLN
INTRAVENOUS | Status: DC | PRN
  Administered 2019-10-20: 70 mg via INTRAVENOUS

## 2019-10-20 MED ORDER — OXYCODONE HCL 5 MG PO TABS: 5.0000 mg | ORAL_TABLET | Freq: Once | ORAL | Status: DC | PRN

## 2019-10-20 MED ORDER — CHLORHEXIDINE GLUCONATE 4 % EX LIQD: 60.0000 mL | Freq: Once | CUTANEOUS | Status: DC

## 2019-10-20 MED ORDER — CHLORHEXIDINE GLUCONATE CLOTH 2 % EX PADS: 6.0000 | MEDICATED_PAD | Freq: Once | CUTANEOUS | Status: DC

## 2019-10-20 MED ORDER — ACETAMINOPHEN 500 MG PO TABS: 1000.0000 mg | ORAL_TABLET | Freq: Three times a day (TID) | ORAL | 0 refills | Status: AC

## 2019-10-20 MED ORDER — LIDOCAINE-EPINEPHRINE 1 %-1:100000 IJ SOLN
INTRAMUSCULAR | Status: DC | PRN
  Administered 2019-10-20: 5 mL

## 2019-10-20 MED ORDER — LACTATED RINGERS IV SOLN: INTRAVENOUS | Status: DC

## 2019-10-20 MED ORDER — LIDOCAINE HCL (CARDIAC) PF 100 MG/5ML IV SOSY
PREFILLED_SYRINGE | INTRAVENOUS | Status: DC | PRN
  Administered 2019-10-20: 60 mg via INTRAVENOUS

## 2019-10-20 MED ORDER — LIDOCAINE-EPINEPHRINE 1 %-1:100000 IJ SOLN
INTRAMUSCULAR | Status: AC
  Filled 2019-10-20: qty 1

## 2019-10-20 MED ORDER — PHENYLEPHRINE HCL-NACL 10-0.9 MG/250ML-% IV SOLN
INTRAVENOUS | Status: DC | PRN
  Administered 2019-10-20: 40 ug/min via INTRAVENOUS

## 2019-10-20 MED ORDER — DEXAMETHASONE SODIUM PHOSPHATE 4 MG/ML IJ SOLN
INTRAMUSCULAR | Status: DC | PRN
  Administered 2019-10-20: 4 mg via INTRAVENOUS

## 2019-10-20 MED ORDER — EPINEPHRINE PF 1 MG/ML IJ SOLN
INTRAMUSCULAR | Status: AC
  Filled 2019-10-20: qty 8

## 2019-10-20 MED ORDER — SODIUM CHLORIDE 0.9 % IR SOLN
Status: DC | PRN
  Administered 2019-10-20: 12000 mL

## 2019-10-20 MED ORDER — EPHEDRINE SULFATE 50 MG/ML IJ SOLN
INTRAMUSCULAR | Status: DC | PRN
  Administered 2019-10-20 (×2): 10 mg via INTRAVENOUS

## 2019-10-20 MED ORDER — FENTANYL CITRATE (PF) 100 MCG/2ML IJ SOLN
50.0000 ug | INTRAMUSCULAR | Status: AC | PRN
  Administered 2019-10-20 (×3): 50 ug via INTRAVENOUS

## 2019-10-20 MED ORDER — PROPOFOL 10 MG/ML IV BOLUS
INTRAVENOUS | Status: DC | PRN
  Administered 2019-10-20: 120 mg via INTRAVENOUS
  Administered 2019-10-20: 20 mg via INTRAVENOUS

## 2019-10-20 MED ORDER — PROMETHAZINE HCL 25 MG/ML IJ SOLN: 6.2500 mg | INTRAMUSCULAR | Status: DC | PRN

## 2019-10-20 MED ORDER — SODIUM CHLORIDE 0.9 % IR SOLN
Status: DC | PRN
  Administered 2019-10-20: 7500 mL

## 2019-10-20 MED ORDER — CLINDAMYCIN PHOSPHATE 900 MG/50ML IV SOLN
INTRAVENOUS | Status: AC
  Filled 2019-10-20: qty 50

## 2019-10-20 MED ORDER — ONDANSETRON HCL 4 MG/2ML IJ SOLN
INTRAMUSCULAR | Status: DC | PRN
  Administered 2019-10-20: 4 mg via INTRAVENOUS

## 2019-10-20 MED ORDER — OXYCODONE HCL 5 MG/5ML PO SOLN: 5.0000 mg | Freq: Once | ORAL | Status: DC | PRN

## 2019-10-20 MED ORDER — BUPIVACAINE HCL (PF) 0.5 % IJ SOLN
INTRAMUSCULAR | Status: DC | PRN
  Administered 2019-10-20: 20 mL via PERINEURAL

## 2019-10-20 MED ORDER — BUPIVACAINE HCL (PF) 0.25 % IJ SOLN
INTRAMUSCULAR | Status: AC
  Filled 2019-10-20: qty 60

## 2019-10-20 MED ORDER — HYDROMORPHONE HCL 1 MG/ML IJ SOLN: 0.2500 mg | INTRAMUSCULAR | Status: DC | PRN

## 2019-10-20 MED ORDER — PROPOFOL 500 MG/50ML IV EMUL
INTRAVENOUS | Status: AC
  Filled 2019-10-20: qty 50

## 2019-10-20 MED ORDER — CIPROFLOXACIN IN D5W 400 MG/200ML IV SOLN: 400.0000 mg | INTRAVENOUS | Status: DC

## 2019-10-20 MED ORDER — CELECOXIB 100 MG PO CAPS: 100.0000 mg | ORAL_CAPSULE | Freq: Two times a day (BID) | ORAL | 0 refills | Status: AC

## 2019-10-20 MED ORDER — BUPIVACAINE LIPOSOME 1.3 % IJ SUSP
INTRAMUSCULAR | Status: DC | PRN
  Administered 2019-10-20: 10 mL via PERINEURAL

## 2019-10-20 MED ORDER — MIDAZOLAM HCL 2 MG/2ML IJ SOLN
1.0000 mg | INTRAMUSCULAR | Status: DC | PRN
  Administered 2019-10-20: 2 mg via INTRAVENOUS

## 2019-10-20 MED ORDER — SUGAMMADEX SODIUM 200 MG/2ML IV SOLN
INTRAVENOUS | Status: DC | PRN
  Administered 2019-10-20: 200 mg via INTRAVENOUS

## 2019-10-20 MED ORDER — CLINDAMYCIN PHOSPHATE 900 MG/50ML IV SOLN
900.0000 mg | INTRAVENOUS | Status: AC
  Administered 2019-10-20: 900 mg via INTRAVENOUS

## 2019-10-20 MED ORDER — ROCURONIUM BROMIDE 10 MG/ML (PF) SYRINGE
PREFILLED_SYRINGE | INTRAVENOUS | Status: AC
  Filled 2019-10-20: qty 10

## 2019-10-20 MED ORDER — MIDAZOLAM HCL 2 MG/2ML IJ SOLN
INTRAMUSCULAR | Status: AC
  Filled 2019-10-20: qty 2

## 2019-10-20 MED ORDER — ONDANSETRON HCL 4 MG PO TABS: 4.0000 mg | ORAL_TABLET | Freq: Three times a day (TID) | ORAL | 1 refills | Status: AC | PRN

## 2019-10-20 MED ORDER — OXYCODONE HCL 5 MG PO TABS: ORAL_TABLET | ORAL | 0 refills | Status: AC

## 2019-10-20 SURGICAL SUPPLY — 129 items
ANCHOR SUT BIO SW 4.75X19.1 (Anchor) ×6 IMPLANT
ANCHOR SUT SWIVELLOK BIO (Anchor) ×3 IMPLANT
BAND RUBBER #18 3X1/16 STRL (MISCELLANEOUS) IMPLANT
BENZOIN TINCTURE PRP APPL 2/3 (GAUZE/BANDAGES/DRESSINGS) IMPLANT
BLADE CLIPPER SURG (BLADE) IMPLANT
BLADE EXCALIBUR 4.0MM X 13CM (MISCELLANEOUS) ×1
BLADE EXCALIBUR 4.0X13 (MISCELLANEOUS) ×4 IMPLANT
BLADE SURG 10 STRL SS (BLADE) IMPLANT
BLADE SURG 15 STRL LF DISP TIS (BLADE) ×3 IMPLANT
BLADE SURG 15 STRL SS (BLADE) ×2
BNDG CONFORM 2 STRL LF (GAUZE/BANDAGES/DRESSINGS) IMPLANT
BNDG ELASTIC 2X5.8 VLCR STR LF (GAUZE/BANDAGES/DRESSINGS) IMPLANT
BURR OVAL 8 FLU 4.0MM X 13CM (MISCELLANEOUS) ×1
BURR OVAL 8 FLU 4.0X13 (MISCELLANEOUS) ×2 IMPLANT
CANISTER SUCT 1200ML W/VALVE (MISCELLANEOUS) ×3 IMPLANT
CANNULA 5.75X71 LONG (CANNULA) IMPLANT
CANNULA PASSPORT 5 (CANNULA) IMPLANT
CANNULA PASSPORT 5CM (CANNULA)
CANNULA PASSPORT BUTTON 10-40 (CANNULA) ×3 IMPLANT
CANNULA TWIST IN 8.25X7CM (CANNULA) ×3 IMPLANT
CHLORAPREP W/TINT 26 (MISCELLANEOUS) ×8 IMPLANT
CLEANER CAUTERY TIP 5X5 PAD (MISCELLANEOUS) IMPLANT
CLOSURE STERI-STRIP 1/2X4 (GAUZE/BANDAGES/DRESSINGS) ×2
CLOSURE WOUND 1/2 X4 (GAUZE/BANDAGES/DRESSINGS)
CLSR STERI-STRIP ANTIMIC 1/2X4 (GAUZE/BANDAGES/DRESSINGS) ×6 IMPLANT
CORD BIPOLAR FORCEPS 12FT (ELECTRODE) IMPLANT
COVER BACK TABLE 60X90IN (DRAPES) ×5 IMPLANT
COVER MAYO STAND STRL (DRAPES) ×5 IMPLANT
COVER WAND RF STERILE (DRAPES) IMPLANT
DECANTER SPIKE VIAL GLASS SM (MISCELLANEOUS) IMPLANT
DERMABOND ADVANCED (GAUZE/BANDAGES/DRESSINGS) ×2
DERMABOND ADVANCED .7 DNX12 (GAUZE/BANDAGES/DRESSINGS) ×1 IMPLANT
DISSECTOR 3.5MM X 13CM CVD (MISCELLANEOUS) IMPLANT
DISSECTOR 4.0MMX13CM CVD (MISCELLANEOUS) IMPLANT
DRAPE IMP U-DRAPE 54X76 (DRAPES) ×5 IMPLANT
DRAPE INCISE IOBAN 66X45 STRL (DRAPES) ×3 IMPLANT
DRAPE LAPAROTOMY 100X72 PEDS (DRAPES) ×3 IMPLANT
DRAPE SHOULDER BEACH CHAIR (DRAPES) ×5 IMPLANT
DRAPE U-SHAPE 76X120 STRL (DRAPES) ×2 IMPLANT
DRAPE UTILITY 15X26 TOWEL STRL (DRAPES) ×3 IMPLANT
DRSG MEPILEX BORDER 4X4 (GAUZE/BANDAGES/DRESSINGS) ×6 IMPLANT
DRSG PAD ABDOMINAL 8X10 ST (GAUZE/BANDAGES/DRESSINGS) ×5 IMPLANT
DRSG TEGADERM 2-3/8X2-3/4 SM (GAUZE/BANDAGES/DRESSINGS) IMPLANT
DRSG TEGADERM 4X4.75 (GAUZE/BANDAGES/DRESSINGS) IMPLANT
DW OUTFLOW CASSETTE/TUBE SET (MISCELLANEOUS) ×5 IMPLANT
ELECT COATED BLADE 2.86 ST (ELECTRODE) ×3 IMPLANT
ELECT NDL BLADE 2-5/6 (NEEDLE) IMPLANT
ELECT NEEDLE BLADE 2-5/6 (NEEDLE) IMPLANT
ELECT REM PT RETURN 9FT ADLT (ELECTROSURGICAL) ×5
ELECT REM PT RETURN 9FT PED (ELECTROSURGICAL)
ELECTRODE REM PT RETRN 9FT PED (ELECTROSURGICAL) IMPLANT
ELECTRODE REM PT RTRN 9FT ADLT (ELECTROSURGICAL) ×1 IMPLANT
GAUZE SPONGE 4X4 12PLY STRL (GAUZE/BANDAGES/DRESSINGS) ×5 IMPLANT
GAUZE SPONGE 4X4 12PLY STRL LF (GAUZE/BANDAGES/DRESSINGS) IMPLANT
GLOVE BIO SURGEON STRL SZ 6.5 (GLOVE) ×12 IMPLANT
GLOVE BIO SURGEONS STRL SZ 6.5 (GLOVE) ×3
GLOVE BIOGEL PI IND STRL 6.5 (GLOVE) ×3 IMPLANT
GLOVE BIOGEL PI IND STRL 7.0 (GLOVE) ×3 IMPLANT
GLOVE BIOGEL PI IND STRL 8 (GLOVE) ×3 IMPLANT
GLOVE BIOGEL PI INDICATOR 6.5 (GLOVE) ×2
GLOVE BIOGEL PI INDICATOR 7.0 (GLOVE) ×6
GLOVE BIOGEL PI INDICATOR 8 (GLOVE) ×2
GLOVE ECLIPSE 6.5 STRL STRAW (GLOVE) ×6 IMPLANT
GLOVE ECLIPSE 8.0 STRL XLNG CF (GLOVE) ×5 IMPLANT
GOWN STRL REUS W/ TWL LRG LVL3 (GOWN DISPOSABLE) ×13 IMPLANT
GOWN STRL REUS W/TWL LRG LVL3 (GOWN DISPOSABLE) ×10
GOWN STRL REUS W/TWL XL LVL3 (GOWN DISPOSABLE) ×5 IMPLANT
IMPL SPEEDBRIDGE KIT (Orthopedic Implant) ×1 IMPLANT
IMPLANT SPEEDBRIDGE KIT (Orthopedic Implant) ×5 IMPLANT
IV NS IRRIG 3000ML ARTHROMATIC (IV SOLUTION) ×24 IMPLANT
KIT STABILIZATION SHOULDER (MISCELLANEOUS) ×5 IMPLANT
KIT STR SPEAR 1.8 FBRTK DISP (KITS) IMPLANT
LASSO CRESCENT QUICKPASS (SUTURE) ×3 IMPLANT
MANIFOLD NEPTUNE II (INSTRUMENTS) ×5 IMPLANT
MARKER PEN SURG W/LABELS BLK (STERILIZATION PRODUCTS) ×3 IMPLANT
NDL HYPO 30GX1 BEV (NEEDLE) IMPLANT
NDL PRECISIONGLIDE 27X1.5 (NEEDLE) ×2 IMPLANT
NDL SAFETY ECLIPSE 18X1.5 (NEEDLE) ×3 IMPLANT
NDL SCORPION MULTI FIRE (NEEDLE) IMPLANT
NEEDLE HYPO 18GX1.5 SHARP (NEEDLE) ×2
NEEDLE HYPO 30GX1 BEV (NEEDLE) IMPLANT
NEEDLE PRECISIONGLIDE 27X1.5 (NEEDLE) ×10 IMPLANT
NEEDLE SCORPION MULTI FIRE (NEEDLE) IMPLANT
NS IRRIG 1000ML POUR BTL (IV SOLUTION) ×3 IMPLANT
PACK ARTHROSCOPY DSU (CUSTOM PROCEDURE TRAY) ×5 IMPLANT
PACK BASIN DAY SURGERY FS (CUSTOM PROCEDURE TRAY) ×10 IMPLANT
PAD CLEANER CAUTERY TIP 5X5 (MISCELLANEOUS)
PENCIL SMOKE EVACUATOR (MISCELLANEOUS) ×3 IMPLANT
PORT APPOLLO RF 90DEGREE MULTI (SURGICAL WAND) ×3 IMPLANT
RESTRAINT HEAD UNIVERSAL NS (MISCELLANEOUS) ×5 IMPLANT
SHEET MEDIUM DRAPE 40X70 STRL (DRAPES) ×3 IMPLANT
SLEEVE SCD COMPRESS KNEE MED (MISCELLANEOUS) ×5 IMPLANT
SLING ARM FOAM STRAP LRG (SOFTGOODS) IMPLANT
SPONGE GAUZE 2X2 8PLY STER LF (GAUZE/BANDAGES/DRESSINGS)
SPONGE GAUZE 2X2 8PLY STRL LF (GAUZE/BANDAGES/DRESSINGS) IMPLANT
SPONGE LAP 4X18 RFD (DISPOSABLE) ×3 IMPLANT
STRIP CLOSURE SKIN 1/2X4 (GAUZE/BANDAGES/DRESSINGS) IMPLANT
SUCTION FRAZIER HANDLE 10FR (MISCELLANEOUS) ×2
SUCTION TUBE FRAZIER 10FR DISP (MISCELLANEOUS) ×1 IMPLANT
SUT FIBERWIRE #2 38 T-5 BLUE (SUTURE)
SUT MNCRL 6-0 UNDY P1 1X18 (SUTURE) IMPLANT
SUT MNCRL AB 3-0 PS2 18 (SUTURE) IMPLANT
SUT MNCRL AB 4-0 PS2 18 (SUTURE) ×8 IMPLANT
SUT MON AB 5-0 P3 18 (SUTURE) IMPLANT
SUT MON AB 5-0 PS2 18 (SUTURE) ×3 IMPLANT
SUT MONOCRYL 6-0 P1 1X18 (SUTURE)
SUT PDS AB 1 CT  36 (SUTURE)
SUT PDS AB 1 CT 36 (SUTURE) IMPLANT
SUT PROLENE 5 0 P 3 (SUTURE) IMPLANT
SUT PROLENE 5 0 PS 2 (SUTURE) IMPLANT
SUT PROLENE 6 0 P 1 18 (SUTURE) IMPLANT
SUT TIGER TAPE 7 IN WHITE (SUTURE) ×6 IMPLANT
SUT VIC AB 5-0 P-3 18X BRD (SUTURE) IMPLANT
SUT VIC AB 5-0 P3 18 (SUTURE)
SUT VIC AB 5-0 PS2 18 (SUTURE) IMPLANT
SUT VICRYL 4-0 PS2 18IN ABS (SUTURE) IMPLANT
SUTURE FIBERWR #2 38 T-5 BLUE (SUTURE) IMPLANT
SUTURE TAPE TIGERLINK 1.3MM BL (SUTURE) ×1 IMPLANT
SUTURETAPE TIGERLINK 1.3MM BL (SUTURE) ×5
SYR 5ML LL (SYRINGE) ×5 IMPLANT
SYR BULB 3OZ (MISCELLANEOUS) ×3 IMPLANT
SYR CONTROL 10ML LL (SYRINGE) ×5 IMPLANT
TAPE FIBER 2MM 7IN #2 BLUE (SUTURE) IMPLANT
TOWEL GREEN STERILE FF (TOWEL DISPOSABLE) ×13 IMPLANT
TRAY DSU PREP LF (CUSTOM PROCEDURE TRAY) ×2 IMPLANT
TUBE CONNECTING 20'X1/4 (TUBING) ×3
TUBE CONNECTING 20X1/4 (TUBING) ×8 IMPLANT
TUBING ARTHROSCOPY IRRIG 16FT (MISCELLANEOUS) ×5 IMPLANT
YANKAUER SUCT BULB TIP NO VENT (SUCTIONS) ×3 IMPLANT

## 2019-10-20 NOTE — Progress Notes (Signed)
Assisted Dr. Miller with right, ultrasound guided, interscalene  block. Side rails up, monitors on throughout procedure. See vital signs in flow sheet. Tolerated Procedure well. 

## 2019-10-20 NOTE — Discharge Instructions (Signed)
INSTRUCTIONS FOR AFTER Lipoma SURGERY   You will likely have some questions about what to expect following your operation.  The following information will help you and your family understand what to expect when you are discharged from the hospital.  Following these guidelines will help ensure a smooth recovery and reduce risks of complications.  Postoperative instructions include information on: diet, wound care, medications and physical activity.  AFTER SURGERY Expect to go home after the procedure.  In some cases, you may need to spend one night in the hospital for observation.  DIET This surgery does not require a specific diet.  However, I have to mention that the healthier you eat the better your body can start healing. It is important to increasing your protein intake.  This means limiting the foods with added sugar.  Focus on fruits and vegetables and some meat.  If you have any liposuction during your procedure be sure to drink water.  If your urine is bright yellow, then it is concentrated, and you need to drink more water.  As a general rule after surgery, you should have 8 ounces of water every hour while awake.  If you find you are persistently nauseated or unable to take in liquids let us know.  NO TOBACCO USE or EXPOSURE.  This will slow your healing process and increase the risk of a wound.  WOUND CARE If you don't have a drain if cleared with orthopedics.  Otherwise don't shower until they clear you.  Use fragrance free soap.  Dial, [name], [place] and Cetaphil are usually mild on the skin.  No baths, pools or hot tubs for two weeks. We close your incision to leave the smallest and best-looking scar. No ointment or creams on your incisions until given the go ahead.  Especially not Neosporin (Too many skin reactions with this one).  A few weeks after surgery you can use Mederma and start massaging the scar.  ACTIVITY No heavy lifting until cleared by the doctor.  It is OK to walk and climb  stairs. In fact, moving your legs is very important to decrease your risk of a blood clot.  It will also help keep you from getting deconditioned.  Every 1 to 2 hours get up and walk for 5 minutes. This will help with a quicker recovery back to normal.  Let pain be your guide so you don't do too much.  NO, you cannot do the spring cleaning and don't plan on taking care of anyone else.  This is your time for TLC.   WORK Everyone returns to work at different times. As a rough guide, most people take at least 1 - 2 weeks off prior to returning to work. If you need documentation for your job, bring the forms to your postoperative follow up visit.  DRIVING Arrange for someone to bring you home from the hospital.  You may be able to drive a few days after surgery but not while taking any narcotics or valium.  BOWEL MOVEMENTS Constipation can occur after anesthesia and while taking pain medication.  It is important to stay ahead for your comfort.  We recommend taking Milk of Magnesia (2 tablespoons; twice a day) while taking the pain pills.  SEROMA This is fluid your body tried to put in the surgical site.  This is normal but if it creates excessive pain and swelling let us know.  It usually decreases in a few weeks.  MEDICATIONS and PAIN CONTROL At your preoperative  visit for you history and physical you were given the following medications: 1. An antibiotic: Start this medication when you get home and take according to the instructions on the bottle. 2. Zofran 4 mg:  This is to treat nausea and vomiting.  You can take this every 6 hours as needed and only if needed. 3. Norco (hydrocodone/acetaminophen) 5/325 mg:  This is only to be used after you have taken the motrin or the tylenol. Every 8 hours as needed. Over the counter Medication to take: 4. Ibuprofen (Motrin) 600 mg:  Take this every 6 hours.  If you have additional pain then take 500 mg of the tylenol.  Only take the Norco after you have tried  these two. 5. Miralax or stool softener of choice: Take this according to the bottle if you take the [place] Call your surgeon's office if any of the following occur: . Fever 101 degrees F or greater . Excessive bleeding or fluid from the incision site. . Pain that increases over time without aid from the medications . Redness, warmth, or pus draining from incision sites . Persistent nausea or inability to take in liquids . Severe misshapen area that underwent the operation.   Post Anesthesia Home Care Instructions  Activity: Get plenty of rest for the remainder of the day. A responsible individual must stay with you for 24 hours following the procedure.  For the next 24 hours, DO NOT: -Drive a car -Paediatric nurse -Drink alcoholic beverages -Take any medication unless instructed by your physician -Make any legal decisions or sign important papers.  Meals: Start with liquid foods such as gelatin or soup. Progress to regular foods as tolerated. Avoid greasy, spicy, heavy foods. If nausea and/or vomiting occur, drink only clear liquids until the nausea and/or vomiting subsides. Call your physician if vomiting continues.  Special Instructions/Symptoms: Your throat may feel dry or sore from the anesthesia or the breathing tube placed in your throat during surgery. If this causes discomfort, gargle with warm salt water. The discomfort should disappear within 24 hours.  If you had a scopolamine patch placed behind your ear for the management of post- operative nausea and/or vomiting:  1. The medication in the patch is effective for 72 hours, after which it should be removed.  Wrap patch in a tissue and discard in the trash. Wash hands thoroughly with soap and water. 2. You may remove the patch earlier than 72 hours if you experience unpleasant side effects which may include dry mouth, dizziness or visual disturbances. 3. Avoid touching the patch. Wash your hands with soap  and water after contact with the patch.    Regional Anesthesia Blocks  1. Numbness or the inability to move the "blocked" extremity may last from 3-48 hours after placement. The length of time depends on the medication injected and your individual response to the medication. If the numbness is not going away after 48 hours, call your surgeon.  2. The extremity that is blocked will need to be protected until the numbness is gone and the  Strength has returned. Because you cannot feel it, you will need to take extra care to avoid injury. Because it may be weak, you may have difficulty moving it or using it. You may not know what position it is in without looking at it while the block is in effect.  3. For blocks in the legs and feet, returning to weight bearing and walking needs to be done carefully. You  will need to wait until the numbness is entirely gone and the strength has returned. You should be able to move your leg and foot normally before you try and bear weight or walk. You will need someone to be with you when you first try to ensure you do not fall and possibly risk injury.  4. Bruising and tenderness at the needle site are common side effects and will resolve in a few days.  5. Persistent numbness or new problems with movement should be communicated to the surgeon or the [place] [phone] [place] [phone]).  Information for Discharge Teaching: EXPAREL (bupivacaine liposome injectable suspension)   Your surgeon or anesthesiologist gave you EXPAREL(bupivacaine) to help control your pain after surgery.   EXPAREL is a local anesthetic that provides pain relief by numbing the tissue around the surgical site.  EXPAREL is designed to release pain medication over time and can control pain for up to 72 hours.  Depending on how you respond to EXPAREL, you may require less pain medication during your recovery.  Possible side effects:  Temporary  loss of sensation or ability to move in the area where bupivacaine was injected.  Nausea, vomiting, constipation  Rarely, numbness and tingling in your mouth or lips, lightheadedness, or anxiety may occur.  Call your doctor right away if you think you may be experiencing any of these sensations, or if you have other questions regarding possible side effects.  Follow all other discharge instructions given to you by your surgeon or nurse. Eat a healthy diet and drink plenty of water or other fluids.  If you return to the hospital for any reason within 96 hours following the administration of EXPAREL, it is important for health care providers to know that you have received this anesthetic. A teal colored band has been placed on your arm with the date, time and amount of EXPAREL you have received in order to alert and inform your health care providers. Please leave this armband in place for the full 96 hours following administration, and then you may remove the band.

## 2019-10-20 NOTE — Anesthesia Procedure Notes (Signed)
Anesthesia Regional Block: Interscalene brachial plexus block   Pre-Anesthetic Checklist: ,, timeout performed, Correct Patient, Correct Site, Correct Laterality, Correct Procedure, Correct Position, site marked, Risks and benefits discussed,  Surgical consent,  Pre-op evaluation,  At surgeon's request and post-op pain management  Laterality: Left  Prep: chloraprep       Needles:  Injection technique: Single-shot  Needle Type: Stimiplex     Needle Length: 9cm  Needle Gauge: 21     Additional Needles:   Procedures:,,,, ultrasound used (permanent image in chart),,,,  Narrative:  Start time: 10/20/2019 8:25 AM End time: 10/20/2019 8:30 AM Injection made incrementally with aspirations every 5 mL.  Performed by: Personally  Anesthesiologist: Lynda Rainwater, MD

## 2019-10-20 NOTE — Anesthesia Preprocedure Evaluation (Signed)
Anesthesia Evaluation  Patient identified by MRN, date of birth, ID band Patient awake    Reviewed: Allergy & Precautions, NPO status , Patient's Chart, lab work & pertinent test results  Airway Mallampati: I  TM Distance: >3 FB Neck ROM: Full    Dental  (+) Dental Advisory Given, Teeth Intact   Pulmonary neg pulmonary ROS,    Pulmonary exam normal breath sounds clear to auscultation       Cardiovascular negative cardio ROS Normal cardiovascular exam Rhythm:Regular Rate:Normal     Neuro/Psych Anxiety Depression Bipolar Disorder Essential tremor    GI/Hepatic negative GI ROS, Neg liver ROS,   Endo/Other  negative endocrine ROS  Renal/GU negative Renal ROS  negative genitourinary   Musculoskeletal  (+) Arthritis , Chronic low back pain   Abdominal   Peds  Hematology negative hematology ROS (+)   Anesthesia Other Findings   Reproductive/Obstetrics                             Anesthesia Physical  Anesthesia Plan  ASA: II  Anesthesia Plan: General   Post-op Pain Management:  Regional for Post-op pain   Induction: Intravenous  PONV Risk Score and Plan: 3 and Treatment may vary due to age or medical condition, Ondansetron, Dexamethasone and Midazolam  Airway Management Planned: LMA  Additional Equipment: None  Intra-op Plan:   Post-operative Plan: Extubation in OR  Informed Consent: I have reviewed the patients History and Physical, chart, labs and discussed the procedure including the risks, benefits and alternatives for the proposed anesthesia with the patient or authorized representative who has indicated his/her understanding and acceptance.       Plan Discussed with: CRNA  Anesthesia Plan Comments:         Anesthesia Quick Evaluation

## 2019-10-20 NOTE — Op Note (Signed)
DATE OF OPERATION: 10/20/2019  LOCATION: [facility]  PREOPERATIVE DIAGNOSIS: Left posterior thorax lipoma / mass  POSTOPERATIVE DIAGNOSIS: Same  PROCEDURE: Excision of left posterior thorax lipoma 5 x 6 cm with intramuscular involvement 2 cm  SURGEON: Shaylynne Lunt [employer], DO  ASSISTANT: Phoebe Sharps, PA  EBL: 1 cc  CONDITION: Stable  COMPLICATIONS: None  INDICATION: The patient, Leah Jensen, is a 74 y.o. female born on Jan 06, 1946, is here for treatment of a left posterior thorax lipoma / mass that has been getting larger.   PROCEDURE DETAILS:  The patient was seen prior to surgery and marked.  The IV antibiotics were given. The patient was taken to the operating room and given a general anesthetic. A standard time out was performed and all information was confirmed by those in the room. SCDs were placed.   The back was prepped and draped. Local with epinepherine was injected at the incision site.   The #15 blade was used to incise the skin.  The bovie was used to dissect down to the mass.  It was consistent with a lipoma and 5 x 6 cm in size.  It was freed from the surrounding skin.  There was 2 cm of intramuscular involvement.  This was excised as well.  The deep layer was closed with the 4-0 Monocryl.  The same Monocryl was used to close the dermis.  Derma bond was applied and a sterile dressing.  The patient was rendered to the orthopedic service.  The family was notified at the end of the case.   The advanced practice practitioner (APP) assisted throughout the case.  The APP was essential in retraction and counter traction when needed to make the case progress smoothly.  This retraction and assistance made it possible to see the tissue plans for the procedure.  The assistance was needed for blood control, tissue re-approximation and assisted with closure of the incision site.  The [place] was signed into law in 2016 which includes the topic of  electronic health records.  This provides immediate access to information in MyChart.  This includes consultation notes, operative notes, office notes, lab results and pathology reports.  If you have any questions about what you read please let us know at your next visit or call us at the office.  We are right here with you.

## 2019-10-20 NOTE — Anesthesia Postprocedure Evaluation (Signed)
Anesthesia Post Note  Patient: DEJANIQUE GEMMA  Procedure(s) Performed: LEFT SHOULDER ARTHROSCOPY WITH ROTATOR CUFF REPAIR, DISTAL CLAVICULECTOMY, PARTIAL ACROMIOPLASTY (Left Shoulder) RESECTION DISTAL CLAVICAL (Left Shoulder) EXCISION BACK LIPOMA (Left Back)     Patient location during evaluation: PACU Anesthesia Type: General Level of consciousness: awake and alert Pain management: pain level controlled Vital Signs Assessment: post-procedure vital signs reviewed and stable Respiratory status: spontaneous breathing, nonlabored ventilation and respiratory function stable Cardiovascular status: blood pressure returned to baseline and stable Postop Assessment: no apparent nausea or vomiting Anesthetic complications: no    Last Vitals:  Vitals:   10/20/19 1215 10/20/19 1240  BP: (!) 115/91 (!) 102/56  Pulse: 86 81  Resp: (!) 21 18  Temp:  36.6 C  SpO2: 100% 97%    Last Pain:  Vitals:   10/20/19 1240  TempSrc: Oral  PainSc: 0-No pain                 Lynda Rainwater

## 2019-10-20 NOTE — Interval H&P Note (Signed)
History and Physical Interval Note:  10/20/2019 8:36 AM  Leah Jensen  has presented today for surgery, with the diagnosis of LEFT SHOULDER OSTEOARTHRITIS, ROTATOR CUFF TEAR, BICEP TENDONITIS.  The various methods of treatment have been discussed with the patient and family. After consideration of risks, benefits and other options for treatment, the patient has consented to  Procedure(s): LEFT SHOULDER ARTHROSCOPY WITH ROTATOR CUFF REPAIR, DISTAL CLAVICULECTOMY, PARTIAL ACROMIOPLASTY AND OPEN BICEPS TENODESIS (Left) EXCISION BACK LIPOMA (N/A) as a surgical intervention.  The patient's history has been reviewed, patient examined, no change in status, stable for surgery.  I have reviewed the patient's chart and labs.  Questions were answered to the patient's satisfaction.     [name]

## 2019-10-20 NOTE — Op Note (Signed)
Orthopaedic Surgery Operative Note (CSN: [id number])  Leah Jensen  19-Nov-1945 Date of Surgery: 10/20/2019   Diagnoses:  Left shoulder acute on chronic rotator cuff tear  Procedure: Arthroscopic extensive debridement Arthroscopic subacromial decompression Arthroscopic rotator cuff repair Arthroscopic distal clavicle excision  Arthroscopic subscapularis repair   Operative Finding Exam under anesthesia: Full motion no limitation Articular space: No loose bodies, capsule intact, labrum with anterior small degenerative Bankart type lesion as well as peripheral circumferential labral fraying.  Type II SLAP tear. Chondral surfaces:Intact, no sign of chondral degeneration on the glenoid or humeral head Biceps: Type II SLAP tear Subscapularis: Upper border subscapularis tear with minimal retraction repaired with a single anchor Superior Cuff: Full-thickness supra and infraspinatus tear with particularly poor bone quality Bursal side: As above, 3 x 2 repair performed, biceps was not amenable to tenodesis.  Successful completion of the planned procedure.  Patient's bone quality was exceptionally poor.  She will need to be held still and has a high risk of failure.  We did use a 6.25 swivel lock posteriorly as the anchor had very little purchase.   Post-operative plan: The patient will be non-weightbearing in a sling for 6 weeks with therapy to start in a delayed fashion.  The patient will be discharged home.  DVT prophylaxis not indicated in ambulatory upper extremity patient without known risk factors.   Pain control with PRN pain medication preferring oral medicines.  Follow up plan will be scheduled in approximately 7 days for incision check and XR.  Post-Op Diagnosis: Same Surgeons:Primary: Leah Gash, MD Assistants:Leah McBane PA-C Location: [facility] OR ROOM 6 Anesthesia: General with Exparel interscalene block Antibiotics: Clindamycin Tourniquet time: None Estimated Blood Loss:  Minimal Complications: None Specimens: None Implants: Implant Name Type Inv. Item Serial No. Manufacturer Lot No. LRB No. Used Action  ANCHOR SUT BIO SW 4.75X19.1 - [id number] Anchor ANCHOR SUT BIO SW 4.75X19.1  [place] [id number] Left 1 Implanted  IMPLANT SPEEDBRIDGE KIT - [id number] Orthopedic Implant IMPLANT SPEEDBRIDGE KIT  [employer] [id number] Left 1 Implanted  ANCHOR SUT BIO SW 4.75X19.1 - [id number] Anchor ANCHOR SUT BIO SW 4.75X19.1  Rolinda Roan [id number] Left 1 Implanted  [address] BIO - [id number] Anchor ANCHOR [employer] [id number] Left 1 Implanted    Indications for Surgery:   Leah Jensen is a 74 y.o. female with continued shoulder pain after dislocation that happened in December.  She failed nonoperative measures and continued of pain.  She additionally had a lipoma that is being scheduled with Dr. Marla Jensen to be resected and we combined these cases in the 1.  The risks and benefits were explained at length including but not limited to continued pain, cuff failure, biceps tenodesis failure, stiffness, need for further surgery and infection.   Procedure:   Patient was correctly identified in the preoperative holding area and operative site marked.  Patient brought to OR and positioned beachchair on an [place] table ensuring that all bony prominences were padded and the head was in an appropriate location.  Anesthesia was induced and the operative shoulder was prepped and draped in the usual sterile fashion.  Timeout was called preincision.  A standard posterior viewing portal was made after localizing the portal with a spinal needle.  An anterior accessory portal was also made.  After clearing the articular space the camera was positioned in the subacromial space.  Findings above.    Extensive debridement was performed of the anterior interval tissue, labral fraying  and the bursa.  Biceps tenotomy performed  Subacromial decompression: We made a  lateral portal with spinal needle guidance. We then proceeded to debride bursal tissue extensively with a shaver and arthrocare device. At that point we continued to identify the borders of the acromion and identify the spur. We then carefully preserved the deltoid fascia and used a burr to convert the acromion to a Type 1 flat acromion without issue.  Distal Clavicle resection:  The scope was placed in the subacromial space from the posterior portal.  A hemostat was placed through the anterior portal and we spread at the [facility] joint.  A burr was then inserted and 10 mm of distal clavicle was resected taking care to avoid damage to the capsule around the joint and avoiding overhanging bone posteriorly.    Subscapularis Repair: We identified a subscapularis tear that involved upper 40% of the subscapularis.  Using a grasping device were able to demonstrate that the tendon could be reapproximated to the lesser tuberosity.  We felt that it was repairable.  We then used a RF ablator to open the rotator interval and released the MGHL to allow further translation of the subscapularis.  This point we cleared both anterior and posterior to the tendon taking care to avoid inferior migration to avoid the neurovascular structures as well as the muscular cutaneous nerve.  We then used a lasso to pass a fiber tape in a mattress fashion through the subscapularis and based 4.75 swivel lock was used to repair back to the lesser tuberosity after prepping the tuberosity extensively.  We had good approximation of the tendon and a recreation of the rolled border.  Arthroscopic Rotator Cuff Repair: Tuberosity was prepared with a burr to a bleeding bed.  Following completion of the above we placed 3 4.7 Swivelock anchor loaded with a tape at inserted at the medial articular margin and an scorpion suture passing device, shuttled  sutures medially in a horizontal mattress suture configuration.  We then tied using arthroscopic knot tying  techniques  each suture to its partner reducing the tendon at the prepared insertion site.  The fiber tape was not tied. With a medial row suture limbs then incorporated, 3 anteriorly and  3 posteriorly, into each of two 4.75 bio composite SwiveLock anchors, each placed 8 to 10 mm below the tip of the tuberosity and spanning anterior-posterior width of the tear with care to avoid over tensioning.  The posterior anchor did not have good purchase and we switch it to a 6.25 mm bio composite swivel lock.  The incisions were closed with absorbable monocryl and steri strips.  A sterile dressing was placed along with a sling. The patient was awoken from general anesthesia and taken to the PACU in stable condition without complication.   Leah Chapel, PA-C, present and scrubbed throughout the case, critical for completion in a timely fashion, and for retraction, instrumentation, closure.

## 2019-10-20 NOTE — Transfer of Care (Signed)
Immediate Anesthesia Transfer of Care Note  Patient: Leah Jensen  Procedure(s) Performed: LEFT SHOULDER ARTHROSCOPY WITH ROTATOR CUFF REPAIR, DISTAL CLAVICULECTOMY, PARTIAL ACROMIOPLASTY (Left Shoulder) RESECTION DISTAL CLAVICAL (Left Shoulder) EXCISION BACK LIPOMA (Left Back)  Patient Location: PACU  Anesthesia Type:GA combined with regional for post-op pain  Level of Consciousness: awake, alert  and oriented  Airway & Oxygen Therapy: Patient Spontanous Breathing and Patient connected to nasal cannula oxygen  Post-op Assessment: Report given to RN and Post -op Vital signs reviewed and stable  Post vital signs: Reviewed and stable  Last Vitals:  Vitals Value Taken Time  BP 102/56 10/20/19 1240  Temp 36.6 C 10/20/19 1240  Pulse 81 10/20/19 1240  Resp 18 10/20/19 1240  SpO2 97 % 10/20/19 1240    Last Pain:  Vitals:   10/20/19 1240  TempSrc: Oral  PainSc: 0-No pain      Patients Stated Pain Goal: 7 ([id number])  Complications: No apparent anesthesia complications

## 2019-10-20 NOTE — Anesthesia Procedure Notes (Signed)
Procedure Name: Intubation Date/Time: 10/20/2019 9:08 AM Performed by: Maryella Shivers, CRNA Pre-anesthesia Checklist: Patient identified, Emergency Drugs available, Suction available and Patient being monitored Patient Re-evaluated:Patient Re-evaluated prior to induction Oxygen Delivery Method: Circle system utilized Preoxygenation: Pre-oxygenation with 100% oxygen Induction Type: IV induction Ventilation: Mask ventilation without difficulty Laryngoscope Size: Mac and 3 Grade View: Grade I Tube type: Oral Tube size: 7.0 mm Number of attempts: 1 Airway Equipment and Method: Stylet and Oral airway Placement Confirmation: ETT inserted through vocal cords under direct vision,  positive ETCO2 and breath sounds checked- equal and bilateral Secured at: 20 cm Tube secured with: Tape Dental Injury: Teeth and Oropharynx as per pre-operative assessment

## 2019-10-21 ENCOUNTER — Encounter: Payer: Self-pay | Admitting: *Deleted

## 2019-10-21 ENCOUNTER — Encounter: Payer: Medicare HMO | Admitting: Plastic Surgery

## 2019-10-21 LAB — SURGICAL PATHOLOGY

## 2019-10-28 ENCOUNTER — Ambulatory Visit (INDEPENDENT_AMBULATORY_CARE_PROVIDER_SITE_OTHER): Payer: Medicare HMO | Admitting: Plastic Surgery

## 2019-10-28 ENCOUNTER — Other Ambulatory Visit: Payer: Self-pay

## 2019-10-28 ENCOUNTER — Encounter: Payer: Self-pay | Admitting: Plastic Surgery

## 2019-10-28 ENCOUNTER — Encounter: Payer: Medicare HMO | Admitting: Plastic Surgery

## 2019-10-28 VITALS — BP 111/73 | HR 65 | Temp 97.3°F | Ht 63.0 in | Wt 138.8 lb

## 2019-10-28 DIAGNOSIS — D171 Benign lipomatous neoplasm of skin and subcutaneous tissue of trunk: Secondary | ICD-10-CM

## 2019-10-28 DIAGNOSIS — M19012 Primary osteoarthritis, left shoulder: Secondary | ICD-10-CM | POA: Diagnosis not present

## 2019-10-28 NOTE — Progress Notes (Signed)
Patient is a 74 year old female here for follow-up after excision of a mass of her left posterior shoulder.  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  She had her shoulder repaired at the same time.  Overall she is doing extremely well.  The patient is closed and healing nicely.  There is no sign of seroma or hematoma there is no sign of infection.  She is having a little bit of itching but no pain for the shoulder she is very pleased with the results.  She is still in the shoulder splint and will be for another month.  She has an appointment to see [place] in March and we will.  Call with any questions or concerns.

## 2019-11-04 ENCOUNTER — Encounter: Payer: Medicare HMO | Admitting: Plastic Surgery

## 2019-11-04 ENCOUNTER — Ambulatory Visit: Payer: Medicare HMO | Attending: Internal Medicine

## 2019-11-04 DIAGNOSIS — Z23 Encounter for immunization: Secondary | ICD-10-CM | POA: Insufficient documentation

## 2019-11-04 NOTE — Progress Notes (Signed)
   Covid-19 Vaccination Clinic  Name:  Leah Jensen    MRN: [id number] DOB: [birth year]  11/04/2019  [name] was observed post Covid-19 immunization for 15 minutes without incident. She was provided with Vaccine Information Sheet and instruction to access the V-Safe system.   [name] was instructed to call 911 with any severe reactions post vaccine: [name] Difficulty breathing  . Swelling of face and throat  . A fast heartbeat  . A bad rash all over body  . Dizziness and weakness   Immunizations Administered    Name Date Dose VIS Date Route   Pfizer COVID-19 Vaccine 11/04/2019  3:59 PM 0.3 mL 08/12/2019 Intramuscular   Manufacturer: [place]   Lot: [id number]   [place]: [id number]

## 2019-11-10 ENCOUNTER — Encounter: Payer: Self-pay | Admitting: Plastic Surgery

## 2019-11-10 NOTE — Progress Notes (Signed)
The patient gave consent to have this visit done by telemedicine / virtual visit.  This is also consent for access the chart and treat the patient via this visit. The patient is located at home.  I, the provider, am at the office.  We spent 15 minutes together for the visit.    Patient is a 74 year old female presenting for follow-up after excision of a mass on her left posterior shoulder.  She had her shoulder repaired at the same time.  She reports overall she feels she is doing very well.  Denies fever, nausea/vomiting, pain or drainage from the lipoma incision area.  Reports some dryness of the area, but no other concerns.  Photo sent via MyChart.  Incision appears to be healing well, C/D/I. No signs of drainage, infection, seroma/hematoma.    Pathology results: Mature fibroadipose tissue, clinically lipoma.  My apply vaseline or unscented lotion to area around incision.  Follow-up as needed. Call office with any questions/concerns or if condition worsens.  [place] was signed into law in 2016 which includes the topic of electronic health records.  This provides immediate access to information in MyChart.  This includes consultation notes, operative notes, office notes, lab results and pathology reports.  If you have any questions about what you read please let us know at your next visit or call us at the office.  We are right here with you.

## 2019-11-11 ENCOUNTER — Other Ambulatory Visit: Payer: Self-pay

## 2019-11-11 ENCOUNTER — Encounter: Payer: Self-pay | Admitting: Plastic Surgery

## 2019-11-11 ENCOUNTER — Ambulatory Visit (INDEPENDENT_AMBULATORY_CARE_PROVIDER_SITE_OTHER): Payer: Medicare HMO | Admitting: Plastic Surgery

## 2019-11-11 DIAGNOSIS — D171 Benign lipomatous neoplasm of skin and subcutaneous tissue of trunk: Secondary | ICD-10-CM

## 2019-11-18 ENCOUNTER — Ambulatory Visit: Payer: Medicare HMO | Admitting: Plastic Surgery

## 2019-12-07 ENCOUNTER — Ambulatory Visit: Payer: Medicare HMO | Attending: Internal Medicine

## 2019-12-07 DIAGNOSIS — Z23 Encounter for immunization: Secondary | ICD-10-CM

## 2019-12-07 NOTE — Progress Notes (Signed)
   Covid-19 Vaccination Clinic  Name:  Leah Jensen    MRN: [id number] DOB: [birth year]  12/07/2019  [name] was observed post Covid-19 immunization for 15 minutes without incident. She was provided with Vaccine Information Sheet and instruction to access the V-Safe system.   [name] was instructed to call 911 with any severe reactions post vaccine: [name] Difficulty breathing  . Swelling of face and throat  . A fast heartbeat  . A bad rash all over body  . Dizziness and weakness   Immunizations Administered    Name Date Dose VIS Date Route   Pfizer COVID-19 Vaccine 12/07/2019  4:15 PM 0.3 mL 08/12/2019 Intramuscular   Manufacturer: [place]   Lot: [id number]   [place]: [id number]

## 2019-12-09 ENCOUNTER — Encounter: Payer: Self-pay | Admitting: Plastic Surgery

## 2019-12-12 DIAGNOSIS — M25512 Pain in left shoulder: Secondary | ICD-10-CM | POA: Diagnosis not present

## 2019-12-12 DIAGNOSIS — M25612 Stiffness of left shoulder, not elsewhere classified: Secondary | ICD-10-CM | POA: Diagnosis not present

## 2019-12-12 DIAGNOSIS — M6281 Muscle weakness (generalized): Secondary | ICD-10-CM | POA: Diagnosis not present

## 2019-12-12 DIAGNOSIS — M75122 Complete rotator cuff tear or rupture of left shoulder, not specified as traumatic: Secondary | ICD-10-CM | POA: Diagnosis not present

## 2019-12-14 DIAGNOSIS — M6281 Muscle weakness (generalized): Secondary | ICD-10-CM | POA: Diagnosis not present

## 2019-12-14 DIAGNOSIS — M25612 Stiffness of left shoulder, not elsewhere classified: Secondary | ICD-10-CM | POA: Diagnosis not present

## 2019-12-14 DIAGNOSIS — M75122 Complete rotator cuff tear or rupture of left shoulder, not specified as traumatic: Secondary | ICD-10-CM | POA: Diagnosis not present

## 2019-12-14 DIAGNOSIS — M25512 Pain in left shoulder: Secondary | ICD-10-CM | POA: Diagnosis not present

## 2019-12-19 DIAGNOSIS — M25612 Stiffness of left shoulder, not elsewhere classified: Secondary | ICD-10-CM | POA: Diagnosis not present

## 2019-12-19 DIAGNOSIS — M6281 Muscle weakness (generalized): Secondary | ICD-10-CM | POA: Diagnosis not present

## 2019-12-19 DIAGNOSIS — M75122 Complete rotator cuff tear or rupture of left shoulder, not specified as traumatic: Secondary | ICD-10-CM | POA: Diagnosis not present

## 2019-12-19 DIAGNOSIS — M25512 Pain in left shoulder: Secondary | ICD-10-CM | POA: Diagnosis not present

## 2019-12-26 DIAGNOSIS — M6281 Muscle weakness (generalized): Secondary | ICD-10-CM | POA: Diagnosis not present

## 2019-12-26 DIAGNOSIS — M25512 Pain in left shoulder: Secondary | ICD-10-CM | POA: Diagnosis not present

## 2019-12-26 DIAGNOSIS — M75122 Complete rotator cuff tear or rupture of left shoulder, not specified as traumatic: Secondary | ICD-10-CM | POA: Diagnosis not present

## 2019-12-26 DIAGNOSIS — M25612 Stiffness of left shoulder, not elsewhere classified: Secondary | ICD-10-CM | POA: Diagnosis not present

## 2019-12-28 DIAGNOSIS — M25612 Stiffness of left shoulder, not elsewhere classified: Secondary | ICD-10-CM | POA: Diagnosis not present

## 2019-12-28 DIAGNOSIS — M6281 Muscle weakness (generalized): Secondary | ICD-10-CM | POA: Diagnosis not present

## 2019-12-28 DIAGNOSIS — M75122 Complete rotator cuff tear or rupture of left shoulder, not specified as traumatic: Secondary | ICD-10-CM | POA: Diagnosis not present

## 2019-12-28 DIAGNOSIS — M25512 Pain in left shoulder: Secondary | ICD-10-CM | POA: Diagnosis not present

## 2020-01-02 DIAGNOSIS — M6281 Muscle weakness (generalized): Secondary | ICD-10-CM | POA: Diagnosis not present

## 2020-01-02 DIAGNOSIS — M25612 Stiffness of left shoulder, not elsewhere classified: Secondary | ICD-10-CM | POA: Diagnosis not present

## 2020-01-02 DIAGNOSIS — M75122 Complete rotator cuff tear or rupture of left shoulder, not specified as traumatic: Secondary | ICD-10-CM | POA: Diagnosis not present

## 2020-01-04 ENCOUNTER — Telehealth: Payer: Self-pay | Admitting: Internal Medicine

## 2020-01-04 DIAGNOSIS — M25512 Pain in left shoulder: Secondary | ICD-10-CM | POA: Diagnosis not present

## 2020-01-04 DIAGNOSIS — M6281 Muscle weakness (generalized): Secondary | ICD-10-CM | POA: Diagnosis not present

## 2020-01-04 DIAGNOSIS — M25612 Stiffness of left shoulder, not elsewhere classified: Secondary | ICD-10-CM | POA: Diagnosis not present

## 2020-01-04 DIAGNOSIS — M75122 Complete rotator cuff tear or rupture of left shoulder, not specified as traumatic: Secondary | ICD-10-CM | POA: Diagnosis not present

## 2020-01-04 NOTE — Telephone Encounter (Signed)
Insurance has been submitted and verified for Prolia. Patient is responsible for a $220 copay. Due anytime. Left message for patient to call back to schedule.  Okay to schedule... Visit Note: Prolia ($220 copay - okay to give per Gareth Eagle) Visit Type: Nurse Provider: Nurse

## 2020-01-09 DIAGNOSIS — M25612 Stiffness of left shoulder, not elsewhere classified: Secondary | ICD-10-CM | POA: Diagnosis not present

## 2020-01-09 DIAGNOSIS — M25512 Pain in left shoulder: Secondary | ICD-10-CM | POA: Diagnosis not present

## 2020-01-09 DIAGNOSIS — M6281 Muscle weakness (generalized): Secondary | ICD-10-CM | POA: Diagnosis not present

## 2020-01-09 DIAGNOSIS — M75122 Complete rotator cuff tear or rupture of left shoulder, not specified as traumatic: Secondary | ICD-10-CM | POA: Diagnosis not present

## 2020-01-16 DIAGNOSIS — M6281 Muscle weakness (generalized): Secondary | ICD-10-CM | POA: Diagnosis not present

## 2020-01-16 DIAGNOSIS — M25612 Stiffness of left shoulder, not elsewhere classified: Secondary | ICD-10-CM | POA: Diagnosis not present

## 2020-01-16 DIAGNOSIS — M25512 Pain in left shoulder: Secondary | ICD-10-CM | POA: Diagnosis not present

## 2020-01-16 DIAGNOSIS — M75122 Complete rotator cuff tear or rupture of left shoulder, not specified as traumatic: Secondary | ICD-10-CM | POA: Diagnosis not present

## 2020-01-23 DIAGNOSIS — M75122 Complete rotator cuff tear or rupture of left shoulder, not specified as traumatic: Secondary | ICD-10-CM | POA: Diagnosis not present

## 2020-01-23 DIAGNOSIS — M25512 Pain in left shoulder: Secondary | ICD-10-CM | POA: Diagnosis not present

## 2020-01-23 DIAGNOSIS — M25612 Stiffness of left shoulder, not elsewhere classified: Secondary | ICD-10-CM | POA: Diagnosis not present

## 2020-01-23 DIAGNOSIS — M6281 Muscle weakness (generalized): Secondary | ICD-10-CM | POA: Diagnosis not present

## 2020-01-26 DIAGNOSIS — M75122 Complete rotator cuff tear or rupture of left shoulder, not specified as traumatic: Secondary | ICD-10-CM | POA: Diagnosis not present

## 2020-01-26 DIAGNOSIS — M25512 Pain in left shoulder: Secondary | ICD-10-CM | POA: Diagnosis not present

## 2020-01-26 DIAGNOSIS — M6281 Muscle weakness (generalized): Secondary | ICD-10-CM | POA: Diagnosis not present

## 2020-01-26 DIAGNOSIS — M25612 Stiffness of left shoulder, not elsewhere classified: Secondary | ICD-10-CM | POA: Diagnosis not present

## 2020-02-06 DIAGNOSIS — M6281 Muscle weakness (generalized): Secondary | ICD-10-CM | POA: Diagnosis not present

## 2020-02-06 DIAGNOSIS — M75122 Complete rotator cuff tear or rupture of left shoulder, not specified as traumatic: Secondary | ICD-10-CM | POA: Diagnosis not present

## 2020-02-06 DIAGNOSIS — M25612 Stiffness of left shoulder, not elsewhere classified: Secondary | ICD-10-CM | POA: Diagnosis not present

## 2020-02-06 DIAGNOSIS — M25512 Pain in left shoulder: Secondary | ICD-10-CM | POA: Diagnosis not present

## 2020-02-08 DIAGNOSIS — M25512 Pain in left shoulder: Secondary | ICD-10-CM | POA: Diagnosis not present

## 2020-02-08 DIAGNOSIS — M75122 Complete rotator cuff tear or rupture of left shoulder, not specified as traumatic: Secondary | ICD-10-CM | POA: Diagnosis not present

## 2020-02-08 DIAGNOSIS — M6281 Muscle weakness (generalized): Secondary | ICD-10-CM | POA: Diagnosis not present

## 2020-02-08 DIAGNOSIS — M25612 Stiffness of left shoulder, not elsewhere classified: Secondary | ICD-10-CM | POA: Diagnosis not present

## 2020-02-08 DIAGNOSIS — M75112 Incomplete rotator cuff tear or rupture of left shoulder, not specified as traumatic: Secondary | ICD-10-CM | POA: Diagnosis not present

## 2020-02-13 DIAGNOSIS — M25612 Stiffness of left shoulder, not elsewhere classified: Secondary | ICD-10-CM | POA: Diagnosis not present

## 2020-02-13 DIAGNOSIS — M75112 Incomplete rotator cuff tear or rupture of left shoulder, not specified as traumatic: Secondary | ICD-10-CM | POA: Diagnosis not present

## 2020-02-13 DIAGNOSIS — M6281 Muscle weakness (generalized): Secondary | ICD-10-CM | POA: Diagnosis not present

## 2020-02-13 DIAGNOSIS — M75122 Complete rotator cuff tear or rupture of left shoulder, not specified as traumatic: Secondary | ICD-10-CM | POA: Diagnosis not present

## 2020-02-13 DIAGNOSIS — M25512 Pain in left shoulder: Secondary | ICD-10-CM | POA: Diagnosis not present

## 2020-02-15 DIAGNOSIS — M6281 Muscle weakness (generalized): Secondary | ICD-10-CM | POA: Diagnosis not present

## 2020-02-15 DIAGNOSIS — M75112 Incomplete rotator cuff tear or rupture of left shoulder, not specified as traumatic: Secondary | ICD-10-CM | POA: Diagnosis not present

## 2020-02-15 DIAGNOSIS — M25612 Stiffness of left shoulder, not elsewhere classified: Secondary | ICD-10-CM | POA: Diagnosis not present

## 2020-02-15 DIAGNOSIS — M75122 Complete rotator cuff tear or rupture of left shoulder, not specified as traumatic: Secondary | ICD-10-CM | POA: Diagnosis not present

## 2020-02-15 DIAGNOSIS — M25512 Pain in left shoulder: Secondary | ICD-10-CM | POA: Diagnosis not present

## 2020-02-22 DIAGNOSIS — M25512 Pain in left shoulder: Secondary | ICD-10-CM | POA: Diagnosis not present

## 2020-02-22 DIAGNOSIS — M75122 Complete rotator cuff tear or rupture of left shoulder, not specified as traumatic: Secondary | ICD-10-CM | POA: Diagnosis not present

## 2020-02-22 DIAGNOSIS — F330 Major depressive disorder, recurrent, mild: Secondary | ICD-10-CM | POA: Diagnosis not present

## 2020-02-22 DIAGNOSIS — M75112 Incomplete rotator cuff tear or rupture of left shoulder, not specified as traumatic: Secondary | ICD-10-CM | POA: Diagnosis not present

## 2020-02-22 DIAGNOSIS — M25612 Stiffness of left shoulder, not elsewhere classified: Secondary | ICD-10-CM | POA: Diagnosis not present

## 2020-02-22 DIAGNOSIS — M6281 Muscle weakness (generalized): Secondary | ICD-10-CM | POA: Diagnosis not present

## 2020-02-27 DIAGNOSIS — M75122 Complete rotator cuff tear or rupture of left shoulder, not specified as traumatic: Secondary | ICD-10-CM | POA: Diagnosis not present

## 2020-02-27 DIAGNOSIS — M6281 Muscle weakness (generalized): Secondary | ICD-10-CM | POA: Diagnosis not present

## 2020-02-27 DIAGNOSIS — M25612 Stiffness of left shoulder, not elsewhere classified: Secondary | ICD-10-CM | POA: Diagnosis not present

## 2020-02-27 DIAGNOSIS — M25512 Pain in left shoulder: Secondary | ICD-10-CM | POA: Diagnosis not present

## 2020-02-29 DIAGNOSIS — M6281 Muscle weakness (generalized): Secondary | ICD-10-CM | POA: Diagnosis not present

## 2020-02-29 DIAGNOSIS — M25512 Pain in left shoulder: Secondary | ICD-10-CM | POA: Diagnosis not present

## 2020-02-29 DIAGNOSIS — F330 Major depressive disorder, recurrent, mild: Secondary | ICD-10-CM | POA: Diagnosis not present

## 2020-02-29 DIAGNOSIS — M25612 Stiffness of left shoulder, not elsewhere classified: Secondary | ICD-10-CM | POA: Diagnosis not present

## 2020-02-29 DIAGNOSIS — M75122 Complete rotator cuff tear or rupture of left shoulder, not specified as traumatic: Secondary | ICD-10-CM | POA: Diagnosis not present

## 2020-03-01 ENCOUNTER — Encounter: Payer: Self-pay | Admitting: Plastic Surgery

## 2020-03-01 DIAGNOSIS — M25512 Pain in left shoulder: Secondary | ICD-10-CM | POA: Diagnosis not present

## 2020-03-07 DIAGNOSIS — M25512 Pain in left shoulder: Secondary | ICD-10-CM | POA: Diagnosis not present

## 2020-03-07 DIAGNOSIS — M25612 Stiffness of left shoulder, not elsewhere classified: Secondary | ICD-10-CM | POA: Diagnosis not present

## 2020-03-07 DIAGNOSIS — M6281 Muscle weakness (generalized): Secondary | ICD-10-CM | POA: Diagnosis not present

## 2020-03-07 DIAGNOSIS — M75122 Complete rotator cuff tear or rupture of left shoulder, not specified as traumatic: Secondary | ICD-10-CM | POA: Diagnosis not present

## 2020-03-12 DIAGNOSIS — M75122 Complete rotator cuff tear or rupture of left shoulder, not specified as traumatic: Secondary | ICD-10-CM | POA: Diagnosis not present

## 2020-03-12 DIAGNOSIS — M6281 Muscle weakness (generalized): Secondary | ICD-10-CM | POA: Diagnosis not present

## 2020-03-12 DIAGNOSIS — M25612 Stiffness of left shoulder, not elsewhere classified: Secondary | ICD-10-CM | POA: Diagnosis not present

## 2020-03-12 DIAGNOSIS — M25512 Pain in left shoulder: Secondary | ICD-10-CM | POA: Diagnosis not present

## 2020-03-13 DIAGNOSIS — J358 Other chronic diseases of tonsils and adenoids: Secondary | ICD-10-CM | POA: Diagnosis not present

## 2020-03-19 DIAGNOSIS — M75122 Complete rotator cuff tear or rupture of left shoulder, not specified as traumatic: Secondary | ICD-10-CM | POA: Diagnosis not present

## 2020-03-19 DIAGNOSIS — M25512 Pain in left shoulder: Secondary | ICD-10-CM | POA: Diagnosis not present

## 2020-03-19 DIAGNOSIS — M6281 Muscle weakness (generalized): Secondary | ICD-10-CM | POA: Diagnosis not present

## 2020-03-19 DIAGNOSIS — M25612 Stiffness of left shoulder, not elsewhere classified: Secondary | ICD-10-CM | POA: Diagnosis not present

## 2020-03-29 DIAGNOSIS — J36 Peritonsillar abscess: Secondary | ICD-10-CM | POA: Diagnosis not present

## 2020-03-29 DIAGNOSIS — J358 Other chronic diseases of tonsils and adenoids: Secondary | ICD-10-CM | POA: Diagnosis not present

## 2020-03-29 DIAGNOSIS — J038 Acute tonsillitis due to other specified organisms: Secondary | ICD-10-CM | POA: Diagnosis not present

## 2020-03-29 DIAGNOSIS — J039 Acute tonsillitis, unspecified: Secondary | ICD-10-CM | POA: Diagnosis not present

## 2020-04-11 DIAGNOSIS — M25512 Pain in left shoulder: Secondary | ICD-10-CM | POA: Diagnosis not present

## 2020-04-11 DIAGNOSIS — M6281 Muscle weakness (generalized): Secondary | ICD-10-CM | POA: Diagnosis not present

## 2020-04-11 DIAGNOSIS — M25612 Stiffness of left shoulder, not elsewhere classified: Secondary | ICD-10-CM | POA: Diagnosis not present

## 2020-04-11 DIAGNOSIS — M75122 Complete rotator cuff tear or rupture of left shoulder, not specified as traumatic: Secondary | ICD-10-CM | POA: Diagnosis not present

## 2020-05-29 DIAGNOSIS — R232 Flushing: Secondary | ICD-10-CM | POA: Diagnosis not present

## 2020-05-29 DIAGNOSIS — M810 Age-related osteoporosis without current pathological fracture: Secondary | ICD-10-CM | POA: Diagnosis not present

## 2020-05-29 DIAGNOSIS — E785 Hyperlipidemia, unspecified: Secondary | ICD-10-CM | POA: Diagnosis not present

## 2020-05-29 DIAGNOSIS — R0789 Other chest pain: Secondary | ICD-10-CM | POA: Diagnosis not present

## 2020-05-29 DIAGNOSIS — G250 Essential tremor: Secondary | ICD-10-CM | POA: Diagnosis not present

## 2020-05-29 DIAGNOSIS — F319 Bipolar disorder, unspecified: Secondary | ICD-10-CM | POA: Diagnosis not present

## 2020-05-29 DIAGNOSIS — Z23 Encounter for immunization: Secondary | ICD-10-CM | POA: Diagnosis not present

## 2020-05-29 DIAGNOSIS — R1310 Dysphagia, unspecified: Secondary | ICD-10-CM | POA: Diagnosis not present

## 2020-05-31 DIAGNOSIS — R1313 Dysphagia, pharyngeal phase: Secondary | ICD-10-CM | POA: Diagnosis not present

## 2020-05-31 DIAGNOSIS — J358 Other chronic diseases of tonsils and adenoids: Secondary | ICD-10-CM | POA: Diagnosis not present

## 2020-06-05 ENCOUNTER — Encounter: Payer: Self-pay | Admitting: Internal Medicine

## 2020-06-05 ENCOUNTER — Other Ambulatory Visit: Payer: Self-pay | Admitting: Internal Medicine

## 2020-06-05 DIAGNOSIS — M810 Age-related osteoporosis without current pathological fracture: Secondary | ICD-10-CM

## 2020-06-07 ENCOUNTER — Other Ambulatory Visit: Payer: Self-pay | Admitting: Otolaryngology

## 2020-06-07 DIAGNOSIS — R1313 Dysphagia, pharyngeal phase: Secondary | ICD-10-CM

## 2020-06-12 ENCOUNTER — Ambulatory Visit
Admission: RE | Admit: 2020-06-12 | Discharge: 2020-06-12 | Disposition: A | Discharge status: Home / Self Care | Payer: Medicare HMO | Source: Ambulatory Visit | Attending: Otolaryngology | Admitting: Otolaryngology

## 2020-06-12 DIAGNOSIS — R1313 Dysphagia, pharyngeal phase: Secondary | ICD-10-CM

## 2020-06-12 DIAGNOSIS — K219 Gastro-esophageal reflux disease without esophagitis: Secondary | ICD-10-CM | POA: Diagnosis not present

## 2020-06-13 NOTE — Telephone Encounter (Signed)
Primary Care at [facility] (Dr. Sharlet Salina)  records faxed to [facility] 06/13/20

## 2020-06-15 DIAGNOSIS — Z961 Presence of intraocular lens: Secondary | ICD-10-CM | POA: Diagnosis not present

## 2020-06-15 DIAGNOSIS — H52203 Unspecified astigmatism, bilateral: Secondary | ICD-10-CM | POA: Diagnosis not present

## 2020-06-15 DIAGNOSIS — H04123 Dry eye syndrome of bilateral lacrimal glands: Secondary | ICD-10-CM | POA: Diagnosis not present

## 2020-06-22 DIAGNOSIS — M25572 Pain in left ankle and joints of left foot: Secondary | ICD-10-CM | POA: Diagnosis not present

## 2020-06-28 ENCOUNTER — Encounter: Payer: Self-pay | Admitting: Internal Medicine

## 2020-07-10 ENCOUNTER — Other Ambulatory Visit: Payer: Self-pay | Admitting: Internal Medicine

## 2020-07-10 DIAGNOSIS — Z1231 Encounter for screening mammogram for malignant neoplasm of breast: Secondary | ICD-10-CM

## 2020-07-10 DIAGNOSIS — M25572 Pain in left ankle and joints of left foot: Secondary | ICD-10-CM | POA: Diagnosis not present

## 2020-07-16 ENCOUNTER — Other Ambulatory Visit (HOSPITAL_COMMUNITY): Payer: Self-pay | Admitting: Internal Medicine

## 2020-07-16 DIAGNOSIS — R0789 Other chest pain: Secondary | ICD-10-CM

## 2020-07-25 ENCOUNTER — Other Ambulatory Visit: Payer: Self-pay

## 2020-07-25 ENCOUNTER — Ambulatory Visit
Admission: RE | Admit: 2020-07-25 | Discharge: 2020-07-25 | Disposition: A | Discharge status: Home / Self Care | Payer: Medicare HMO | Source: Ambulatory Visit | Attending: Internal Medicine | Admitting: Internal Medicine

## 2020-07-25 DIAGNOSIS — Z1231 Encounter for screening mammogram for malignant neoplasm of breast: Secondary | ICD-10-CM

## 2020-08-01 ENCOUNTER — Other Ambulatory Visit: Payer: Self-pay | Admitting: Internal Medicine

## 2020-08-01 DIAGNOSIS — R928 Other abnormal and inconclusive findings on diagnostic imaging of breast: Secondary | ICD-10-CM

## 2020-08-01 DIAGNOSIS — F331 Major depressive disorder, recurrent, moderate: Secondary | ICD-10-CM | POA: Diagnosis not present

## 2020-08-04 ENCOUNTER — Other Ambulatory Visit: Payer: Medicare HMO

## 2020-09-05 DIAGNOSIS — M79675 Pain in left toe(s): Secondary | ICD-10-CM | POA: Diagnosis not present

## 2020-09-05 DIAGNOSIS — M79674 Pain in right toe(s): Secondary | ICD-10-CM | POA: Diagnosis not present

## 2020-09-05 DIAGNOSIS — L600 Ingrowing nail: Secondary | ICD-10-CM | POA: Diagnosis not present

## 2020-09-11 ENCOUNTER — Other Ambulatory Visit: Payer: Self-pay | Admitting: Internal Medicine

## 2020-09-12 ENCOUNTER — Ambulatory Visit
Admission: RE | Admit: 2020-09-12 | Discharge: 2020-09-12 | Disposition: A | Discharge status: Home / Self Care | Payer: Medicare HMO | Source: Ambulatory Visit | Attending: Internal Medicine | Admitting: Internal Medicine

## 2020-09-12 ENCOUNTER — Other Ambulatory Visit: Payer: Self-pay

## 2020-09-12 ENCOUNTER — Other Ambulatory Visit: Payer: Self-pay | Admitting: Internal Medicine

## 2020-09-12 DIAGNOSIS — R922 Inconclusive mammogram: Secondary | ICD-10-CM | POA: Diagnosis not present

## 2020-09-12 DIAGNOSIS — R928 Other abnormal and inconclusive findings on diagnostic imaging of breast: Secondary | ICD-10-CM

## 2020-09-12 DIAGNOSIS — N6011 Diffuse cystic mastopathy of right breast: Secondary | ICD-10-CM | POA: Diagnosis not present

## 2020-09-19 ENCOUNTER — Other Ambulatory Visit: Payer: Medicare HMO

## 2020-09-24 DIAGNOSIS — E785 Hyperlipidemia, unspecified: Secondary | ICD-10-CM | POA: Diagnosis not present

## 2020-09-24 DIAGNOSIS — M810 Age-related osteoporosis without current pathological fracture: Secondary | ICD-10-CM | POA: Diagnosis not present

## 2020-09-26 DIAGNOSIS — F330 Major depressive disorder, recurrent, mild: Secondary | ICD-10-CM | POA: Diagnosis not present

## 2020-09-27 DIAGNOSIS — F319 Bipolar disorder, unspecified: Secondary | ICD-10-CM | POA: Diagnosis not present

## 2020-09-27 DIAGNOSIS — R002 Palpitations: Secondary | ICD-10-CM | POA: Diagnosis not present

## 2020-09-27 DIAGNOSIS — Z0000 Encounter for general adult medical examination without abnormal findings: Secondary | ICD-10-CM | POA: Diagnosis not present

## 2020-09-27 DIAGNOSIS — Z1331 Encounter for screening for depression: Secondary | ICD-10-CM | POA: Diagnosis not present

## 2020-09-27 DIAGNOSIS — E785 Hyperlipidemia, unspecified: Secondary | ICD-10-CM | POA: Diagnosis not present

## 2020-09-27 DIAGNOSIS — M810 Age-related osteoporosis without current pathological fracture: Secondary | ICD-10-CM | POA: Diagnosis not present

## 2020-09-28 DIAGNOSIS — Z01419 Encounter for gynecological examination (general) (routine) without abnormal findings: Secondary | ICD-10-CM | POA: Diagnosis not present

## 2020-09-28 DIAGNOSIS — Z79890 Hormone replacement therapy (postmenopausal): Secondary | ICD-10-CM | POA: Diagnosis not present

## 2020-09-28 DIAGNOSIS — N898 Other specified noninflammatory disorders of vagina: Secondary | ICD-10-CM | POA: Diagnosis not present

## 2020-10-16 DIAGNOSIS — S2242XA Multiple fractures of ribs, left side, initial encounter for closed fracture: Secondary | ICD-10-CM | POA: Diagnosis not present

## 2020-11-21 DIAGNOSIS — F330 Major depressive disorder, recurrent, mild: Secondary | ICD-10-CM | POA: Diagnosis not present

## 2020-12-10 ENCOUNTER — Other Ambulatory Visit: Payer: Medicare HMO

## 2020-12-31 ENCOUNTER — Other Ambulatory Visit: Payer: Self-pay

## 2020-12-31 ENCOUNTER — Ambulatory Visit
Admission: RE | Admit: 2020-12-31 | Discharge: 2020-12-31 | Disposition: A | Discharge status: Home / Self Care | Payer: Medicare HMO | Source: Ambulatory Visit | Attending: Internal Medicine | Admitting: Internal Medicine

## 2020-12-31 DIAGNOSIS — Z780 Asymptomatic menopausal state: Secondary | ICD-10-CM | POA: Diagnosis not present

## 2020-12-31 DIAGNOSIS — M810 Age-related osteoporosis without current pathological fracture: Secondary | ICD-10-CM

## 2021-01-07 ENCOUNTER — Other Ambulatory Visit: Payer: Medicare HMO

## 2021-01-14 ENCOUNTER — Other Ambulatory Visit (HOSPITAL_COMMUNITY): Payer: Self-pay | Admitting: *Deleted

## 2021-01-15 ENCOUNTER — Other Ambulatory Visit: Payer: Self-pay

## 2021-01-15 ENCOUNTER — Ambulatory Visit (HOSPITAL_COMMUNITY)
Admission: RE | Admit: 2021-01-15 | Discharge: 2021-01-15 | Disposition: A | Discharge status: Home / Self Care | Payer: Medicare HMO | Source: Ambulatory Visit | Attending: Internal Medicine | Admitting: Internal Medicine

## 2021-01-15 DIAGNOSIS — M810 Age-related osteoporosis without current pathological fracture: Secondary | ICD-10-CM | POA: Insufficient documentation

## 2021-01-15 MED ORDER — DENOSUMAB 60 MG/ML ~~LOC~~ SOSY: 60.0000 mg | PREFILLED_SYRINGE | Freq: Once | SUBCUTANEOUS | Status: AC

## 2021-01-15 MED ORDER — DENOSUMAB 60 MG/ML ~~LOC~~ SOSY
PREFILLED_SYRINGE | SUBCUTANEOUS | Status: AC
  Administered 2021-01-15: 60 mg via SUBCUTANEOUS
  Filled 2021-01-15: qty 1

## 2021-04-29 DIAGNOSIS — F3341 Major depressive disorder, recurrent, in partial remission: Secondary | ICD-10-CM | POA: Diagnosis not present

## 2021-05-30 DIAGNOSIS — M810 Age-related osteoporosis without current pathological fracture: Secondary | ICD-10-CM | POA: Diagnosis not present

## 2021-05-30 DIAGNOSIS — E785 Hyperlipidemia, unspecified: Secondary | ICD-10-CM | POA: Diagnosis not present

## 2021-05-30 DIAGNOSIS — Z79899 Other long term (current) drug therapy: Secondary | ICD-10-CM | POA: Diagnosis not present

## 2021-05-30 DIAGNOSIS — R0789 Other chest pain: Secondary | ICD-10-CM | POA: Diagnosis not present

## 2021-05-30 DIAGNOSIS — F319 Bipolar disorder, unspecified: Secondary | ICD-10-CM | POA: Diagnosis not present

## 2021-06-10 ENCOUNTER — Encounter: Payer: Self-pay | Admitting: Cardiology

## 2021-06-10 ENCOUNTER — Other Ambulatory Visit: Payer: Self-pay

## 2021-06-10 ENCOUNTER — Ambulatory Visit: Payer: Medicare HMO | Admitting: Cardiology

## 2021-06-10 VITALS — BP 105/71 | HR 68 | Temp 97.9°F | Resp 17 | Ht 63.0 in | Wt 161.0 lb

## 2021-06-10 DIAGNOSIS — E781 Pure hyperglyceridemia: Secondary | ICD-10-CM

## 2021-06-10 DIAGNOSIS — E7800 Pure hypercholesterolemia, unspecified: Secondary | ICD-10-CM | POA: Diagnosis not present

## 2021-06-10 DIAGNOSIS — R072 Precordial pain: Secondary | ICD-10-CM | POA: Diagnosis not present

## 2021-06-10 NOTE — Progress Notes (Signed)
Date:  06/10/2021   ID:  Leah Jensen, DOB [birth year], MRN [id number]  PCP:  Michael Boston, MD  Cardiologist:  Rex Kras, DO, [facility] (established care 06/10/21)  REASON FOR CONSULT: Chest pain  REQUESTING PHYSICIAN:  Michael Boston, MD [address] [place],  [place] [address]  Chief Complaint  Patient presents with  . Chest Pain  . New Patient (Initial Visit)    HPI  Leah Jensen is a 75 y.o. female who presents to the office with a chief complaint of " chest pain." Patient's past medical history and cardiovascular risk factors include: Hyperlipidemia, bipolar disorder, osteoporosis, benign essential tremor, advanced age, postmenopausal female.  She is referred to the office at the request of Michael Boston, MD for evaluation of chest pain.  Patient is been having chest discomfort for the last 1.5 years.  The intensity, frequency, duration has remained relatively stable.  The discomfort is located substernally, radiates up towards the throat and neck, symptoms last for less than 5 minutes, intensity is 5 out of 10, pressure-like sensation, not brought on by effort related activities, does not resolve by rest, usually self-limited.  Last chest pain was approximately 1.5 months ago.  Her cholesterol was noted to be elevated in January 2022 and she was placed on atorvastatin 10 mg p.o. daily.  However, recent blood work notes that her cholesterol remains uncontrolled and her medications have been uptitrated to 40 mg p.o. nightly by her primary care provider.  I do not have the most recent labs for review.  She recently was seen by her PCP these symptoms were discussed and now is here for cardiovascular evaluation.  FUNCTIONAL STATUS: Currently works in a [employer] which keeps her active.  But no structured exercise program or daily routine.  ALLERGIES: Allergies  Allergen Reactions  . Amoxicillin Hives    Has patient had a PCN reaction causing immediate rash,  facial/tongue/throat swelling, SOB or lightheadedness with hypotension: No Has patient had a PCN reaction causing severe rash involving mucus membranes or skin necrosis: No Has patient had a PCN reaction that required hospitalization: No Has patient had a PCN reaction occurring within the last 10 years: Unknown If all of the above answers are "NO", then may proceed with Cephalosporin use.    [name] Penicillins Rash    Has patient had a PCN reaction causing immediate rash, facial/tongue/throat swelling, SOB or lightheadedness with hypotension: No Has patient had a PCN reaction causing severe rash involving mucus membranes or skin necrosis: No Has patient had a PCN reaction that required hospitalization: No Has patient had a PCN reaction occurring within the last 10 years: Unknown If all of the above answers are "NO", then may proceed with Cephalosporin use.     MEDICATION LIST PRIOR TO VISIT: Current Meds  Medication Sig  . ARIPiprazole (ABILIFY) 10 MG tablet Take 1 tablet by mouth daily.  [name] atorvastatin (LIPITOR) 10 MG tablet Take 30 mg by mouth daily.  [name] b complex vitamins tablet Take 1 tablet by mouth daily.   . Biotin 300 MCG TABS Take 1 tablet by mouth daily.  . Calcium Citrate-Vitamin D (CALCIUM CITRATE + D PO) Take 1 tablet by mouth 2 (two) times daily.  . cholecalciferol (VITAMIN D) 25 MCG (1000 UNIT) tablet Take 1 tablet by mouth daily.  . Cholecalciferol (VITAMIN D3) 1000 UNITS CAPS Take 1,000 Units by mouth 2 (two) times daily.   . clonazePAM (KLONOPIN) 0.5 MG tablet Take 0.5-1 tablets (0.25-0.5  mg total) by mouth 3 (three) times daily as needed for anxiety. Take 1/2 tab twice daily if needed and 1 tab at bedtime as needed (Patient taking differently: Take 0.25-0.5 mg by mouth 3 (three) times daily as needed for anxiety (typically takes  1 tablet every morning).)  . denosumab (PROLIA) 60 MG/ML SOSY injection Inject 60 mg into the skin every 6 (six) months.  . desvenlafaxine (PRISTIQ)  50 MG 24 hr tablet Take 1 tablet (50 mg total) by mouth at bedtime.  . Difluprednate 0.05 % EMUL Place 1 drop into both eyes as needed.  [name] estradiol (CLIMARA - DOSED IN MG/24 HR) 0.025 mg/24hr patch Place 1 patch (0.025 mg total) onto the skin 6 days. In the evening.  . Multiple Vitamin (MULTI VITAMIN) TABS Take 1 tablet by mouth daily.  . progesterone (PROMETRIUM) 100 MG capsule Take 1 capsule (100 mg total) by mouth daily. In the evening.  . propranolol (INDERAL) 20 MG tablet Take 10-20 mg by mouth 3 (three) times daily as needed (for tremor (take 1 tablet scheduled in the morning)).   . QUEtiapine (SEROQUEL) 25 MG tablet Take 1 tablet by mouth daily.  . [DISCONTINUED] Multiple Vitamin (MULTIVITAMIN WITH MINERALS) TABS Take 1 tablet by mouth daily.      PAST MEDICAL HISTORY: Past Medical History:  Diagnosis Date  . ALLERGIC RHINITIS   . Anxiety   . Bipolar disorder ([place])   . Cataract    slight on lt. eye  . DEPRESSION/ANXIETY   . Hyperlipidemia   . Low back pain   . Osteoarthritis    "probably qwhere/Dr. Percell Miller" (06/28/2015)  . 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 Palpitations   . TREMOR, ESSENTIAL   . Tubular adenoma of colon 2013    PAST SURGICAL HISTORY: Past Surgical History:  Procedure Laterality Date  . ANAL FISSURE REPAIR  1990's  . BREAST BIOPSY Left ~ 1990  . BREAST EXCISIONAL BIOPSY Right    benign patient not sure of dates  . CATARACT EXTRACTION Bilateral 06/02/2017 & 06/30/2017  . child birth  72; 62  . COLONOSCOPY  2013  . EYE SURGERY    . JOINT REPLACEMENT    . MASS EXCISION Left 10/20/2019   Procedure: EXCISION BACK LIPOMA;  Surgeon: Wallace Going, DO;  Location: [place];  Service: Plastics;  Laterality: Left;  . POLYPECTOMY    . RESECTION DISTAL CLAVICAL Left 10/20/2019   Procedure: RESECTION DISTAL CLAVICAL;  Surgeon: Hiram Gash, MD;  Location: [place];  Service: Orthopedics;  Laterality: Left;  . SHOULDER ARTHROSCOPY WITH ROTATOR CUFF REPAIR AND SUBACROMIAL DECOMPRESSION Left 10/20/2019   Procedure: LEFT SHOULDER ARTHROSCOPY WITH ROTATOR CUFF REPAIR, DISTAL CLAVICULECTOMY, PARTIAL ACROMIOPLASTY;  Surgeon: Hiram Gash, MD;  Location: [place];  Service: Orthopedics;  Laterality: Left;  . TOTAL KNEE ARTHROPLASTY Right 06/27/2015   Procedure: TOTAL KNEE ARTHROPLASTY;  Surgeon: Ninetta Lights, MD;  Location: [place];  Service: Orthopedics;  Laterality: Right;  . TOTAL KNEE ARTHROPLASTY WITH REVISION COMPONENTS Right 10/19/2017  . TOTAL KNEE ARTHROPLASTY WITH REVISION COMPONENTS Right 10/19/2017   Procedure: TOTAL KNEE ARTHROPLASTY WITH REVISION COMPONENTS;  Surgeon: Renette Butters, MD;  Location: [place];  Service: Orthopedics;  Laterality: Right;    FAMILY HISTORY: The patient family history includes Arthritis in her father and mother; Breast cancer in her paternal aunt; Heart disease in her father  and mother; Lung disease in her mother; Obesity in her brother; Skin cancer in her brother; Stroke in her mother.  SOCIAL HISTORY:  The patient  reports that she has never smoked. She has never used smokeless tobacco. She reports that she does not currently use alcohol. She reports that she does not use drugs.  REVIEW OF SYSTEMS: Review of Systems  Constitutional: Negative for chills and fever.  HENT:  Negative for hoarse voice and nosebleeds.   Eyes:  Negative for discharge, double vision and pain.  Cardiovascular:  Positive for chest pain. Negative for claudication, dyspnea on exertion, leg swelling, near-syncope, orthopnea, palpitations, paroxysmal nocturnal dyspnea and syncope.  Respiratory:  Negative for hemoptysis and shortness of breath.   Musculoskeletal:  Negative for muscle cramps and myalgias.  Gastrointestinal:  Negative for abdominal pain, constipation, diarrhea, hematemesis, hematochezia, melena,  nausea and vomiting.  Neurological:  Negative for dizziness and light-headedness.   PHYSICAL EXAM: Vitals with BMI 06/10/2021 01/15/2021 10/28/2019  Height 5\' 3"  - 5\' 3"   Weight 161 lbs - 138 lbs 13 oz  BMI 36.62 - 94.76  Systolic 546 92 503  Diastolic 71 59 73  Pulse 68 74 65    CONSTITUTIONAL: Well-developed and well-nourished. No acute distress.  SKIN: Skin is warm and dry. No rash noted. No cyanosis. No pallor. No jaundice HEAD: Normocephalic and atraumatic.  EYES: No scleral icterus MOUTH/THROAT: Moist oral membranes.  NECK: No JVD present. No thyromegaly noted. No carotid bruits  LYMPHATIC: No visible cervical adenopathy.  CHEST Normal respiratory effort. No intercostal retractions  LUNGS: Clear to auscultation bilaterally.  No stridor. No wheezes. No rales.  CARDIOVASCULAR: Regular rate and rhythm, positive S1-S2, no murmurs rubs or gallops appreciated. ABDOMINAL: No apparent ascites.  EXTREMITIES: No peripheral edema  HEMATOLOGIC: No significant bruising NEUROLOGIC: Oriented to person, place, and time. Nonfocal. Normal muscle tone.  PSYCHIATRIC: Normal mood and affect. Normal behavior. Cooperative  CARDIAC DATABASE: EKG: 06/10/2021: Normal sinus rhythm, 60 bpm, without underlying ischemia or injury pattern.   Echocardiogram: No results found for this or any previous visit from the past 1095 days.    Stress Testing: No results found for this or any previous visit from the past 1095 days.   Heart Catheterization: None  External Labs: Collected: 09/24/2020 provided by PCP Sodium 139, potassium 4.4, chloride 104, bicarb 24, BUN 12, creatinine 0.7 Hemoglobin 13.2 g/dL, hematocrit 43% Total cholesterol 234, triglycerides 198, HDL 61, LDL 133, non-HDL 173 TSH 2.52  LABORATORY DATA: CBC Latest Ref Rng & Units 03/31/2019 12/08/2017 10/05/2017  WBC 4.0 - 10.5 K/uL 5.0 4.6 5.0  Hemoglobin 12.0 - 15.0 g/dL 13.4 13.3 14.0  Hematocrit 36.0 - 46.0 % 39.8 39.4 43.0   Platelets 150.0 - 400.0 K/uL 228.0 300.0 253    CMP Latest Ref Rng & Units 03/31/2019 12/08/2017 10/05/2017  Glucose 70 - 99 mg/dL 78 82 94  BUN 6 - 23 mg/dL 22 7 19   Creatinine 0.40 - 1.20 mg/dL 0.76 0.66 0.74  Sodium 135 - 145 mEq/L 138 139 138  Potassium 3.5 - 5.1 mEq/L 4.4 3.7 4.0  Chloride 96 - 112 mEq/L 102 103 103  CO2 19 - 32 mEq/L 30 29 23   Calcium 8.4 - 10.5 mg/dL 9.9 9.2 9.2  Total Protein 6.0 - 8.3 g/dL 6.5 7.0 -  Total Bilirubin 0.2 - 1.2 mg/dL 0.3 0.5 -  Alkaline Phos 39 - 117 U/L 54 59 -  AST 0 - 37 U/L 22 21 -  ALT 0 - 35  U/L 22 19 -    Lipid Panel     Component Value Date/Time   CHOL 233 (H) 03/31/2019 1530   TRIG 203.0 (H) 03/31/2019 1530   HDL 59.70 03/31/2019 1530   CHOLHDL 4 03/31/2019 1530   VLDL 40.6 (H) 03/31/2019 1530   LDLCALC 122 (H) 12/08/2017 1557   LDLDIRECT 68.0 03/31/2019 1530    No components found for: NTPROBNP No results for input(s): PROBNP in the last 8760 hours. No results for input(s): TSH in the last 8760 hours.  BMP No results for input(s): NA, K, CL, CO2, GLUCOSE, BUN, CREATININE, CALCIUM, GFRNONAA, GFRAA in the last 8760 hours.  HEMOGLOBIN A1C Lab Results  Component Value Date   HGBA1C 5.4 12/08/2017    IMPRESSION:    ICD-10-CM   1. Precordial pain  R07.2 EKG 12-Lead    PCV ECHOCARDIOGRAM COMPLETE    PCV MYOCARDIAL PERFUSION WO LEXISCAN    2. Pure hypercholesterolemia  E78.00     3. Hypertriglyceridemia  E78.1        RECOMMENDATIONS: Leah Jensen is a 75 y.o. female whose past medical history and cardiac risk factors include: Hyperlipidemia, bipolar disorder, osteoporosis, benign essential tremor, advanced age, postmenopausal female.  Precordial pain Symptoms suggestive of noncardiac etiology; however, given her risk factors would recommend additional testing. EKG: Nonischemic Echocardiogram will be ordered to evaluate for structural heart disease and left ventricular systolic function. Exercise nuclear  stress test to evaluate for functional status and reversible ischemia in a patient with chest pain, and elevated 10-year risk of ASCVD (estimated 11.4%). Patient is requested to hold propranolol 2 days prior to her exercise stress test.  Pure hypercholesterolemia Currently on Lipitor.   She denies myalgia or other side effects. Most recent lipids dated January 2022 reviewed as noted above. Patient states that she recently had labs with her PCP and her atorvastatin has been increased to 40 mg p.o. nightly.  I have asked her to bring a copy of these labs at the next office visit. Currently managed by primary care provider.  Hypertriglyceridemia Patient would like to continue with lifestyle changes and reevaluate.  Thank you for allowing [place] to participate in the care of Leah Jensen please reach out if any questions or concerns arise.  As part of today's consultation reviewed outside records such as office notes provided by primary team and labs.  FINAL MEDICATION LIST END OF ENCOUNTER: No orders of the defined types were placed in this encounter.   Medications Discontinued During This Encounter  Medication Reason  . buPROPion (WELLBUTRIN XL) 300 MG 24 hr tablet Error  . Multiple Vitamin (MULTIVITAMIN WITH MINERALS) TABS Duplicate  . calcium carbonate (SUPER CALCIUM) 1500 (600 Ca) MG TABS tablet Duplicate     Current Outpatient Medications:  .  ARIPiprazole (ABILIFY) 10 MG tablet, Take 1 tablet by mouth daily., Disp: , Rfl:  .  atorvastatin (LIPITOR) 10 MG tablet, Take 30 mg by mouth daily., Disp: , Rfl:  .  b complex vitamins tablet, Take 1 tablet by mouth daily. , Disp: , Rfl:  .  Biotin 300 MCG TABS, Take 1 tablet by mouth daily., Disp: , Rfl:  .  Calcium Citrate-Vitamin D (CALCIUM CITRATE + D PO), Take 1 tablet by mouth 2 (two) times daily., Disp: , Rfl:  .  cholecalciferol (VITAMIN D) 25 MCG (1000 UNIT) tablet, Take 1 tablet by mouth daily., Disp: , Rfl:  .  Cholecalciferol  (VITAMIN D3) 1000 UNITS CAPS, Take 1,000 Units by mouth  2 (two) times daily. , Disp: , Rfl:  .  clonazePAM (KLONOPIN) 0.5 MG tablet, Take 0.5-1 tablets (0.25-0.5 mg total) by mouth 3 (three) times daily as needed for anxiety. Take 1/2 tab twice daily if needed and 1 tab at bedtime as needed (Patient taking differently: Take 0.25-0.5 mg by mouth 3 (three) times daily as needed for anxiety (typically takes  1 tablet every morning).), Disp: 90 tablet, Rfl: 0 .  denosumab (PROLIA) 60 MG/ML SOSY injection, Inject 60 mg into the skin every 6 (six) months., Disp: , Rfl:  .  desvenlafaxine (PRISTIQ) 50 MG 24 hr tablet, Take 1 tablet (50 mg total) by mouth at bedtime., Disp: 30 tablet, Rfl: 0 .  Difluprednate 0.05 % EMUL, Place 1 drop into both eyes as needed., Disp: , Rfl:  .  estradiol (CLIMARA - DOSED IN MG/24 HR) 0.025 mg/24hr patch, Place 1 patch (0.025 mg total) onto the skin 6 days. In the evening., Disp: 12 patch, Rfl: 3 .  Multiple Vitamin (MULTI VITAMIN) TABS, Take 1 tablet by mouth daily., Disp: , Rfl:  .  progesterone (PROMETRIUM) 100 MG capsule, Take 1 capsule (100 mg total) by mouth daily. In the evening., Disp: 90 capsule, Rfl: 3 .  propranolol (INDERAL) 20 MG tablet, Take 10-20 mg by mouth 3 (three) times daily as needed (for tremor (take 1 tablet scheduled in the morning)). , Disp: , Rfl:  .  QUEtiapine (SEROQUEL) 25 MG tablet, Take 1 tablet by mouth daily., Disp: , Rfl:   Orders Placed This Encounter  Procedures  . PCV MYOCARDIAL PERFUSION WO LEXISCAN  . EKG 12-Lead  . PCV ECHOCARDIOGRAM COMPLETE    There are no Patient Instructions on file for this visit.   --Continue cardiac medications as reconciled in final medication list. --Return in about 4 weeks (around 07/08/2021) for Follow up, Chest pain, Review test results. Or sooner if needed. --Continue follow-up with your primary care physician regarding the management of your other chronic comorbid conditions.  Patient's questions and  concerns were addressed to her satisfaction. She voices understanding of the instructions provided during this encounter.   This note was created using a voice recognition software as a result there may be grammatical errors inadvertently enclosed that do not reflect the nature of this encounter. Every attempt is made to correct such errors.  Rex Kras, [place], [facility]  Pager: [phone] Office: [phone]

## 2021-06-19 DIAGNOSIS — F3341 Major depressive disorder, recurrent, in partial remission: Secondary | ICD-10-CM | POA: Diagnosis not present

## 2021-06-26 ENCOUNTER — Ambulatory Visit: Payer: Medicare HMO

## 2021-06-26 ENCOUNTER — Other Ambulatory Visit: Payer: Self-pay

## 2021-06-26 DIAGNOSIS — R072 Precordial pain: Secondary | ICD-10-CM | POA: Diagnosis not present

## 2021-07-11 ENCOUNTER — Ambulatory Visit: Payer: Medicare HMO | Admitting: Cardiology

## 2021-07-11 ENCOUNTER — Other Ambulatory Visit: Payer: Self-pay

## 2021-07-11 ENCOUNTER — Encounter: Payer: Self-pay | Admitting: Cardiology

## 2021-07-11 VITALS — BP 110/72 | HR 65 | Temp 97.7°F | Resp 16 | Ht 63.0 in | Wt 166.4 lb

## 2021-07-11 DIAGNOSIS — E781 Pure hyperglyceridemia: Secondary | ICD-10-CM

## 2021-07-11 DIAGNOSIS — Z712 Person consulting for explanation of examination or test findings: Secondary | ICD-10-CM

## 2021-07-11 DIAGNOSIS — R072 Precordial pain: Secondary | ICD-10-CM | POA: Diagnosis not present

## 2021-07-11 DIAGNOSIS — E7800 Pure hypercholesterolemia, unspecified: Secondary | ICD-10-CM | POA: Diagnosis not present

## 2021-07-11 NOTE — Progress Notes (Signed)
Date:  07/11/2021   ID:  Leah Jensen, DOB 1946-07-16, MRN [id number]  PCP:  Michael Boston, MD  Cardiologist:  Rex Kras, DO, [facility] (established care 06/10/21)  Date: 07/11/21 Last Office Visit: 06/10/2021  Chief Complaint  Patient presents with   Chest Pain   Follow-up    4 weeks   Results    HPI  Leah Jensen is a 75 y.o. female who presents to the office with a chief complaint of " reevaluation of chest pain and discuss test results." Patient's past medical history and cardiovascular risk factors include: Hyperlipidemia, bipolar disorder, osteoporosis, benign essential tremor, advanced age, postmenopausal female.  She is referred to the office at the request of Michael Boston, MD for evaluation of chest pain.  Prior to establishing care patient was experiencing chest discomfort for approximately 1.5 years.  The overall intensity, frequency and duration have remained stable.  When the symptoms occur the discomfort to be substernally located, radiated towards her throat/neck, lasting for less than 5 minutes, intensity 5 out of 10, pressure-like sensation, not brought on by effort related activities and did not resolve with rest.  Since last office visit patient has undergone an echocardiogram and stress test.  Results reviewed with her in great detail and noted below for further reference.  In addition, clinically she has not had any reoccurrence of chest pain since the last office encounter.  Given her hyperlipidemia her cholesterol medications were recently uptitrated by PCP I do not have the most recent labs for review.  FUNCTIONAL STATUS: Currently works in a [employer] which keeps her active.  But no structured exercise program or daily routine.  ALLERGIES: Allergies  Allergen Reactions   Amoxicillin Hives    Has patient had a PCN reaction causing immediate rash, facial/tongue/throat swelling, SOB or lightheadedness with hypotension: No Has patient had a PCN  reaction causing severe rash involving mucus membranes or skin necrosis: No Has patient had a PCN reaction that required hospitalization: No Has patient had a PCN reaction occurring within the last 10 years: Unknown If all of the above answers are "NO", then may proceed with Cephalosporin use.     Penicillins Rash    Has patient had a PCN reaction causing immediate rash, facial/tongue/throat swelling, SOB or lightheadedness with hypotension: No Has patient had a PCN reaction causing severe rash involving mucus membranes or skin necrosis: No Has patient had a PCN reaction that required hospitalization: No Has patient had a PCN reaction occurring within the last 10 years: Unknown If all of the above answers are "NO", then may proceed with Cephalosporin use.     MEDICATION LIST PRIOR TO VISIT: Current Meds  Medication Sig   ARIPiprazole (ABILIFY) 10 MG tablet Take 1 tablet by mouth daily.   atorvastatin (LIPITOR) 40 MG tablet Take 40 mg by mouth daily.   b complex vitamins tablet Take 1 tablet by mouth daily.    Biotin 300 MCG TABS Take 1 tablet by mouth daily.   Calcium Citrate-Vitamin D (CALCIUM CITRATE + D PO) Take 1 tablet by mouth 2 (two) times daily.   Cholecalciferol (VITAMIN D3) 1000 UNITS CAPS Take 1,000 Units by mouth 2 (two) times daily.    clonazePAM (KLONOPIN) 0.5 MG tablet Take 0.5-1 tablets (0.25-0.5 mg total) by mouth 3 (three) times daily as needed for anxiety. Take 1/2 tab twice daily if needed and 1 tab at bedtime as needed (Patient taking differently: Take 0.25-0.5 mg by mouth 3 (three) times  daily as needed for anxiety (typically takes  1 tablet every morning).)   denosumab (PROLIA) 60 MG/ML SOSY injection Inject 60 mg into the skin every 6 (six) months.   desvenlafaxine (PRISTIQ) 50 MG 24 hr tablet Take 1 tablet (50 mg total) by mouth at bedtime.   estradiol (CLIMARA - DOSED IN MG/24 HR) 0.025 mg/24hr patch Place 1 patch (0.025 mg total) onto the skin 6 days. In the  evening.   Multiple Vitamin (MULTI VITAMIN) TABS Take 1 tablet by mouth daily.   progesterone (PROMETRIUM) 100 MG capsule Take 1 capsule (100 mg total) by mouth daily. In the evening.   propranolol (INDERAL) 20 MG tablet Take 10-20 mg by mouth 3 (three) times daily as needed (for tremor (take 1 tablet scheduled in the morning)).    QUEtiapine (SEROQUEL) 25 MG tablet Take 1 tablet by mouth daily.     PAST MEDICAL HISTORY: Past Medical History:  Diagnosis Date   ALLERGIC RHINITIS    Anxiety    Bipolar disorder ([place])    Cataract    slight on lt. eye   DEPRESSION/ANXIETY    Hyperlipidemia    Low back pain    Osteoarthritis    "probably qwhere/Dr. 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   DEXA -4.5 L spine, started Fosamax 09/2011, change to Prolia 04/2013   Palpitations    TREMOR, ESSENTIAL    Tubular adenoma of colon 2013    PAST SURGICAL HISTORY: Past Surgical History:  Procedure Laterality Date   ANAL FISSURE REPAIR  1990's   BREAST BIOPSY Left ~ [place] EXCISIONAL BIOPSY Right    benign patient not sure of dates   CATARACT EXTRACTION Bilateral 06/02/2017 & 06/30/2017   child birth  1970; 1974   COLONOSCOPY  2013   EYE SURGERY     JOINT REPLACEMENT     MASS EXCISION Left 10/20/2019   Procedure: EXCISION BACK LIPOMA;  Surgeon: Wallace Going, DO;  Location: [place];  Service: Plastics;  Laterality: Left;   POLYPECTOMY     RESECTION DISTAL CLAVICAL Left 10/20/2019   Procedure: RESECTION DISTAL CLAVICAL;  Surgeon: Hiram Gash, MD;  Location: [place];  Service: Orthopedics;  Laterality: Left;   SHOULDER ARTHROSCOPY WITH ROTATOR CUFF REPAIR AND SUBACROMIAL DECOMPRESSION Left 10/20/2019   Procedure: LEFT SHOULDER ARTHROSCOPY WITH ROTATOR CUFF REPAIR, DISTAL CLAVICULECTOMY, PARTIAL ACROMIOPLASTY;  Surgeon: Hiram Gash, MD;  Location: [place];  Service: Orthopedics;  Laterality: Left;   TOTAL  KNEE ARTHROPLASTY Right 06/27/2015   Procedure: TOTAL KNEE ARTHROPLASTY;  Surgeon: Ninetta Lights, MD;  Location: [place];  Service: Orthopedics;  Laterality: Right;   TOTAL KNEE ARTHROPLASTY WITH REVISION COMPONENTS Right 10/19/2017   TOTAL KNEE ARTHROPLASTY WITH REVISION COMPONENTS Right 10/19/2017   Procedure: TOTAL KNEE ARTHROPLASTY WITH REVISION COMPONENTS;  Surgeon: Renette Butters, MD;  Location: [place];  Service: Orthopedics;  Laterality: Right;    FAMILY HISTORY: The patient family history includes Arthritis in her father and mother; Breast cancer in her paternal aunt; Heart disease in her father and mother; Lung disease in her mother; Obesity in her brother; Skin cancer in her brother; Stroke in her mother.  SOCIAL HISTORY:  The patient  reports that she has never smoked. She has never used smokeless tobacco. She reports that she does not currently use alcohol. She reports that she does not use drugs.  REVIEW OF SYSTEMS: Review of Systems  Constitutional: Negative for  chills and fever.  HENT:  Negative for hoarse voice and nosebleeds.   Eyes:  Negative for discharge, double vision and pain.  Cardiovascular:  Negative for chest pain, claudication, dyspnea on exertion, leg swelling, near-syncope, orthopnea, palpitations, paroxysmal nocturnal dyspnea and syncope.  Respiratory:  Negative for hemoptysis and shortness of breath.   Musculoskeletal:  Negative for muscle cramps and myalgias.  Gastrointestinal:  Negative for abdominal pain, constipation, diarrhea, hematemesis, hematochezia, melena, nausea and vomiting.  Neurological:  Negative for dizziness and light-headedness.   PHYSICAL EXAM: Vitals with BMI 07/11/2021 06/10/2021 01/15/2021  Height 5\' 3"  5\' 3"  -  Weight 166 lbs 6 oz 161 lbs -  BMI 64.33 29.51 -  Systolic 884 166 92  Diastolic 72 71 59  Pulse 65 68 74    CONSTITUTIONAL: Well-developed and well-nourished. No acute distress.  SKIN: Skin is warm and dry. No rash  noted. No cyanosis. No pallor. No jaundice HEAD: Normocephalic and atraumatic.  EYES: No scleral icterus MOUTH/THROAT: Moist oral membranes.  NECK: No JVD present. No thyromegaly noted. No carotid bruits  LYMPHATIC: No visible cervical adenopathy.  CHEST Normal respiratory effort. No intercostal retractions  LUNGS: Clear to auscultation bilaterally.  No stridor. No wheezes. No rales.  CARDIOVASCULAR: Regular rate and rhythm, positive S1-S2, no murmurs rubs or gallops appreciated. ABDOMINAL: No apparent ascites.  EXTREMITIES: No peripheral edema  HEMATOLOGIC: No significant bruising NEUROLOGIC: Oriented to person, place, and time. Nonfocal. Normal muscle tone.  PSYCHIATRIC: Normal mood and affect. Normal behavior. Cooperative  CARDIAC DATABASE: EKG: 06/10/2021: Normal sinus rhythm, 60 bpm, without underlying ischemia or injury pattern.   Echocardiogram: 06/26/2021: Normal LV systolic function with visual EF 60-65%. Left ventricle cavity is normal in size. Mild left ventricular hypertrophy. Normal global wall motion. Normal diastolic filling pattern, normal LAP.  Mild (Grade I) aortic regurgitation.d Mild (Grade I) mitral regurgitation. Mild tricuspid regurgitation. No evidence of pulmonary hypertension. No prior study for comparison.   Stress Testing: Exercise Myoview stress test 06/26/2021: 1 Day Rest/Stress Protocol. Exercise time 3 minutes 00 seconds on Bruce protocol, achieved 4.64 METS, 94% of APMHR Stress ECG negative for ischemia.  Without evidence of reversible myocardial ischemia or prior infarct. Calculated LVEF 76%, visually appears hyperdynamic, no regional wall motion normalities.  Low risk study.   Heart Catheterization: None  External Labs: Collected: 09/24/2020 provided by PCP Sodium 139, potassium 4.4, chloride 104, bicarb 24, BUN 12, creatinine 0.7 Hemoglobin 13.2 g/dL, hematocrit 43% Total cholesterol 234, triglycerides 198, HDL 61, LDL 133, non-HDL  173 TSH 2.52  LABORATORY DATA: CBC Latest Ref Rng & Units 03/31/2019 12/08/2017 10/05/2017  WBC 4.0 - 10.5 K/uL 5.0 4.6 5.0  Hemoglobin 12.0 - 15.0 g/dL 13.4 13.3 14.0  Hematocrit 36.0 - 46.0 % 39.8 39.4 43.0  Platelets 150.0 - 400.0 K/uL 228.0 300.0 253    CMP Latest Ref Rng & Units 03/31/2019 12/08/2017 10/05/2017  Glucose 70 - 99 mg/dL 78 82 94  BUN 6 - 23 mg/dL 22 7 19   Creatinine 0.40 - 1.20 mg/dL 0.76 0.66 0.74  Sodium 135 - 145 mEq/L 138 139 138  Potassium 3.5 - 5.1 mEq/L 4.4 3.7 4.0  Chloride 96 - 112 mEq/L 102 103 103  CO2 19 - 32 mEq/L 30 29 23   Calcium 8.4 - 10.5 mg/dL 9.9 9.2 9.2  Total Protein 6.0 - 8.3 g/dL 6.5 7.0 -  Total Bilirubin 0.2 - 1.2 mg/dL 0.3 0.5 -  Alkaline Phos 39 - 117 U/L 54 59 -  AST 0 -  37 U/L 22 21 -  ALT 0 - 35 U/L 22 19 -    Lipid Panel     Component Value Date/Time   CHOL 233 (H) 03/31/2019 1530   TRIG 203.0 (H) 03/31/2019 1530   HDL 59.70 03/31/2019 1530   CHOLHDL 4 03/31/2019 1530   VLDL 40.6 (H) 03/31/2019 1530   LDLCALC 122 (H) 12/08/2017 1557   LDLDIRECT 68.0 03/31/2019 1530    No components found for: NTPROBNP No results for input(s): PROBNP in the last 8760 hours. No results for input(s): TSH in the last 8760 hours.  BMP No results for input(s): NA, K, CL, CO2, GLUCOSE, BUN, CREATININE, CALCIUM, GFRNONAA, GFRAA in the last 8760 hours.  HEMOGLOBIN A1C Lab Results  Component Value Date   HGBA1C 5.4 12/08/2017    IMPRESSION:    ICD-10-CM   1. Precordial pain  R07.2     2. Pure hypercholesterolemia  E78.00     3. Hypertriglyceridemia  E78.1     4. Encounter to discuss test results  Z71.2        RECOMMENDATIONS: Leah Jensen is a 75 y.o. female whose past medical history and cardiac risk factors include: Hyperlipidemia, bipolar disorder, osteoporosis, benign essential tremor, advanced age, postmenopausal female.  Precordial pain Chest pain-free since last office visit. Reviewed the results of the echo and stress  test. EKG: Nonischemic Echocardiogram: LVEF 51-76%, normal diastolic filling pattern, mild valvular heart disease.   Nuclear stress test: Low risk study  Patient is educated on the importance of improving her modifiable cardiovascular risk factors. She is also educated on the concept of balanced ischemia and therefore if she has recurrence of chest pain or similar chest pain that has increased in intensity, frequency, and/or duration she is asked to go to the closest ER via EMS for further evaluation and management.  She verbalizes understanding.   Shared decision was to follow-up on annual basis after her well visit for risk stratification or sooner if change in clinical status  Pure hypercholesterolemia Currently on Lipitor.   She denies myalgia or other side effects. Currently managed by primary care provider.  Hypertriglyceridemia Patient would like to continue with lifestyle changes and reevaluate. Recommended pharmacological therapy if clinically warranted based on her recent lipid profile.  Patient states that she will discuss it further with PCP.  FINAL MEDICATION LIST END OF ENCOUNTER: No orders of the defined types were placed in this encounter.   Medications Discontinued During This Encounter  Medication Reason   Difluprednate 0.05 % EMUL Error   cholecalciferol (VITAMIN D) 25 MCG (1000 UNIT) tablet Error     Current Outpatient Medications:    ARIPiprazole (ABILIFY) 10 MG tablet, Take 1 tablet by mouth daily., Disp: , Rfl:    atorvastatin (LIPITOR) 40 MG tablet, Take 40 mg by mouth daily., Disp: , Rfl:    b complex vitamins tablet, Take 1 tablet by mouth daily. , Disp: , Rfl:    Biotin 300 MCG TABS, Take 1 tablet by mouth daily., Disp: , Rfl:    Calcium Citrate-Vitamin D (CALCIUM CITRATE + D PO), Take 1 tablet by mouth 2 (two) times daily., Disp: , Rfl:    Cholecalciferol (VITAMIN D3) 1000 UNITS CAPS, Take 1,000 Units by mouth 2 (two) times daily. , Disp: , Rfl:     clonazePAM (KLONOPIN) 0.5 MG tablet, Take 0.5-1 tablets (0.25-0.5 mg total) by mouth 3 (three) times daily as needed for anxiety. Take 1/2 tab twice daily if needed and 1 tab  at bedtime as needed (Patient taking differently: Take 0.25-0.5 mg by mouth 3 (three) times daily as needed for anxiety (typically takes  1 tablet every morning).), Disp: 90 tablet, Rfl: 0   denosumab (PROLIA) 60 MG/ML SOSY injection, Inject 60 mg into the skin every 6 (six) months., Disp: , Rfl:    desvenlafaxine (PRISTIQ) 50 MG 24 hr tablet, Take 1 tablet (50 mg total) by mouth at bedtime., Disp: 30 tablet, Rfl: 0   estradiol (CLIMARA - DOSED IN MG/24 HR) 0.025 mg/24hr patch, Place 1 patch (0.025 mg total) onto the skin 6 days. In the evening., Disp: 12 patch, Rfl: 3   Multiple Vitamin (MULTI VITAMIN) TABS, Take 1 tablet by mouth daily., Disp: , Rfl:    progesterone (PROMETRIUM) 100 MG capsule, Take 1 capsule (100 mg total) by mouth daily. In the evening., Disp: 90 capsule, Rfl: 3   propranolol (INDERAL) 20 MG tablet, Take 10-20 mg by mouth 3 (three) times daily as needed (for tremor (take 1 tablet scheduled in the morning)). , Disp: , Rfl:    QUEtiapine (SEROQUEL) 25 MG tablet, Take 1 tablet by mouth daily., Disp: , Rfl:   No orders of the defined types were placed in this encounter.   There are no Patient Instructions on file for this visit.   --Continue cardiac medications as reconciled in final medication list. --Return in about 1 year (around 07/11/2022) for Annual follow up. . Or sooner if needed. --Continue follow-up with your primary care physician regarding the management of your other chronic comorbid conditions.  Patient's questions and concerns were addressed to her satisfaction. She voices understanding of the instructions provided during this encounter.   This note was created using a voice recognition software as a result there may be grammatical errors inadvertently enclosed that do not reflect the nature  of this encounter. Every attempt is made to correct such errors.  Rex Kras, [place], [facility]  Pager: [phone] Office: [phone]

## 2021-07-17 DIAGNOSIS — H35372 Puckering of macula, left eye: Secondary | ICD-10-CM | POA: Diagnosis not present

## 2021-07-17 DIAGNOSIS — H52203 Unspecified astigmatism, bilateral: Secondary | ICD-10-CM | POA: Diagnosis not present

## 2021-07-17 DIAGNOSIS — H04123 Dry eye syndrome of bilateral lacrimal glands: Secondary | ICD-10-CM | POA: Diagnosis not present

## 2021-07-17 DIAGNOSIS — H26492 Other secondary cataract, left eye: Secondary | ICD-10-CM | POA: Diagnosis not present

## 2021-07-30 DIAGNOSIS — M810 Age-related osteoporosis without current pathological fracture: Secondary | ICD-10-CM | POA: Diagnosis not present

## 2021-07-30 DIAGNOSIS — M25552 Pain in left hip: Secondary | ICD-10-CM | POA: Diagnosis not present

## 2021-07-30 DIAGNOSIS — M545 Low back pain, unspecified: Secondary | ICD-10-CM | POA: Diagnosis not present

## 2021-08-13 DIAGNOSIS — M545 Low back pain, unspecified: Secondary | ICD-10-CM | POA: Diagnosis not present

## 2021-08-14 DIAGNOSIS — F3341 Major depressive disorder, recurrent, in partial remission: Secondary | ICD-10-CM | POA: Diagnosis not present

## 2021-08-16 DIAGNOSIS — M25552 Pain in left hip: Secondary | ICD-10-CM | POA: Diagnosis not present

## 2021-09-16 ENCOUNTER — Other Ambulatory Visit (HOSPITAL_COMMUNITY): Payer: Self-pay | Admitting: *Deleted

## 2021-09-17 ENCOUNTER — Other Ambulatory Visit: Payer: Self-pay

## 2021-09-17 ENCOUNTER — Ambulatory Visit (HOSPITAL_COMMUNITY)
Admission: RE | Admit: 2021-09-17 | Discharge: 2021-09-17 | Disposition: A | Discharge status: Home / Self Care | Payer: Medicare HMO | Source: Ambulatory Visit | Attending: Internal Medicine | Admitting: Internal Medicine

## 2021-09-17 DIAGNOSIS — M810 Age-related osteoporosis without current pathological fracture: Secondary | ICD-10-CM | POA: Insufficient documentation

## 2021-09-17 MED ORDER — DENOSUMAB 60 MG/ML ~~LOC~~ SOSY: 60.0000 mg | PREFILLED_SYRINGE | Freq: Once | SUBCUTANEOUS | Status: DC

## 2021-09-17 MED ORDER — DENOSUMAB 60 MG/ML ~~LOC~~ SOSY
PREFILLED_SYRINGE | SUBCUTANEOUS | Status: AC
  Administered 2021-09-17: 60 mg via SUBCUTANEOUS
  Filled 2021-09-17: qty 1

## 2021-09-30 DIAGNOSIS — Z01419 Encounter for gynecological examination (general) (routine) without abnormal findings: Secondary | ICD-10-CM | POA: Diagnosis not present

## 2021-09-30 DIAGNOSIS — Z79890 Hormone replacement therapy (postmenopausal): Secondary | ICD-10-CM | POA: Diagnosis not present

## 2021-09-30 DIAGNOSIS — R234 Changes in skin texture: Secondary | ICD-10-CM | POA: Diagnosis not present

## 2021-11-06 DIAGNOSIS — F3341 Major depressive disorder, recurrent, in partial remission: Secondary | ICD-10-CM | POA: Diagnosis not present

## 2022-01-29 DIAGNOSIS — F3342 Major depressive disorder, recurrent, in full remission: Secondary | ICD-10-CM | POA: Diagnosis not present

## 2022-02-05 DIAGNOSIS — F3342 Major depressive disorder, recurrent, in full remission: Secondary | ICD-10-CM | POA: Diagnosis not present

## 2022-04-21 DIAGNOSIS — Z1283 Encounter for screening for malignant neoplasm of skin: Secondary | ICD-10-CM | POA: Diagnosis not present

## 2022-04-21 DIAGNOSIS — D225 Melanocytic nevi of trunk: Secondary | ICD-10-CM | POA: Diagnosis not present

## 2022-04-21 DIAGNOSIS — L658 Other specified nonscarring hair loss: Secondary | ICD-10-CM | POA: Diagnosis not present

## 2022-05-14 ENCOUNTER — Encounter: Payer: Self-pay | Admitting: Gastroenterology

## 2022-05-20 DIAGNOSIS — M25512 Pain in left shoulder: Secondary | ICD-10-CM | POA: Diagnosis not present

## 2022-05-27 DIAGNOSIS — M25512 Pain in left shoulder: Secondary | ICD-10-CM | POA: Diagnosis not present

## 2022-05-28 DIAGNOSIS — F3342 Major depressive disorder, recurrent, in full remission: Secondary | ICD-10-CM | POA: Diagnosis not present

## 2022-06-06 DIAGNOSIS — M25512 Pain in left shoulder: Secondary | ICD-10-CM | POA: Diagnosis not present

## 2022-06-09 DIAGNOSIS — L820 Inflamed seborrheic keratosis: Secondary | ICD-10-CM | POA: Diagnosis not present

## 2022-06-30 ENCOUNTER — Other Ambulatory Visit: Payer: Self-pay | Admitting: Internal Medicine

## 2022-06-30 DIAGNOSIS — Z1231 Encounter for screening mammogram for malignant neoplasm of breast: Secondary | ICD-10-CM

## 2022-07-01 ENCOUNTER — Encounter: Payer: Self-pay | Admitting: Gastroenterology

## 2022-07-01 ENCOUNTER — Ambulatory Visit: Payer: Medicare HMO | Admitting: Gastroenterology

## 2022-07-01 VITALS — BP 90/60 | HR 68 | Ht 63.0 in | Wt 162.6 lb

## 2022-07-01 DIAGNOSIS — Z86010 Personal history of colonic polyps: Secondary | ICD-10-CM

## 2022-07-01 DIAGNOSIS — K921 Melena: Secondary | ICD-10-CM

## 2022-07-01 MED ORDER — NA SULFATE-K SULFATE-MG SULF 17.5-3.13-1.6 GM/177ML PO SOLN: 1.0000 | Freq: Once | ORAL | 0 refills | Status: AC

## 2022-07-01 NOTE — Progress Notes (Signed)
Assessment    Hematochezia and a personal history of adenomatous colon polyps.  Suspected hemorrhoidal bleeding. R/O colorectal neoplasms, proctitis and other etiologies.  Her overall health status is very good and she is agreeable to proceed with colonoscopy   Recommendations   Schedule colonoscopy. The risks (including bleeding, perforation, infection, missed lesions, medication reactions and possible hospitalization or surgery if complications occur), benefits, and alternatives to colonoscopy with possible biopsy and possible polypectomy were discussed with the patient and they consent to proceed.   OTC Preparation H suppositories daily x 5 days then as needed prn hemorrhoidal symptoms     HPI   Chief complaint: hematochezia and personal history of adenomatous colon polyps  Patient profile:  Leah Jensen is a 76 y.o. female referred by Cristie Hem, MD for hematochezia a personal history of adenomatous colon polyps.  She relates bright red blood per rectum with bowel movements for the past 2 weeks.  She relates small amounts of bright red blood per rectum intermittently over the years however no bleeding in the past few months.  The recent bleeding has been a larger volume of blood than she has previously noted, presumably from her hemorrhoids.  No other active gastrointestinal complaints.  She underwent colonoscopy in 2013 with 2 small tubular adenomas removed and again in 2018 with 1 small tubular adenoma removed.  Internal hemorrhoids were noted on both colonoscopies. Denies weight loss, abdominal pain, constipation, diarrhea, change in stool caliber, melena, nausea, vomiting, dysphagia, reflux symptoms, chest pain.    Previous Labs / Imaging::    Latest Ref Rng & Units 03/31/2019    3:30 PM 12/08/2017    3:57 PM 10/05/2017    9:31 AM  CBC  WBC 4.0 - 10.5 K/uL 5.0  4.6  5.0   Hemoglobin 12.0 - 15.0 g/dL 13.4  13.3  14.0   Hematocrit 36.0 - 46.0 % 39.8  39.4  43.0   Platelets  150.0 - 400.0 K/uL 228.0  300.0  253     No results found for: "LIPASE"    Latest Ref Rng & Units 03/31/2019    3:30 PM 12/08/2017    3:57 PM 10/05/2017    9:31 AM  CMP  Glucose 70 - 99 mg/dL 78  82  94   BUN 6 - 23 mg/dL '22  7  19   '$ Creatinine 0.40 - 1.20 mg/dL 0.76  0.66  0.74   Sodium 135 - 145 mEq/L 138  139  138   Potassium 3.5 - 5.1 mEq/L 4.4  3.7  4.0   Chloride 96 - 112 mEq/L 102  103  103   CO2 19 - 32 mEq/L '30  29  23   '$ Calcium 8.4 - 10.5 mg/dL 9.9  9.2  9.2   Total Protein 6.0 - 8.3 g/dL 6.5  7.0    Total Bilirubin 0.2 - 1.2 mg/dL 0.3  0.5    Alkaline Phos 39 - 117 U/L 54  59    AST 0 - 37 U/L 22  21    ALT 0 - 35 U/L 22  19       Previous GI evaluation    Endoscopies:  See HPI  Imaging:  PCV ECHOCARDIOGRAM COMPLETE Echocardiogram 06/26/2021: Normal LV systolic function with visual EF 60-65%. Left ventricle cavity  is normal in size. Mild left ventricular hypertrophy. Normal global wall  motion. Normal diastolic filling pattern, normal LAP.  Mild (Grade I) aortic regurgitation. Mild (Grade I) mitral regurgitation. Mild  tricuspid regurgitation. No evidence of pulmonary hypertension. No prior study for comparison.    Past Medical History:  Diagnosis Date   ALLERGIC RHINITIS    Anxiety    Bipolar disorder ([place])    Cataract    slight on lt. eye   DEPRESSION/ANXIETY    Hyperlipidemia    Low back pain    Osteoarthritis    "probably qwhere/Dr. 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   DEXA -4.5 L spine, started Fosamax 09/2011, change to Prolia 04/2013   Palpitations    TREMOR, ESSENTIAL    Tubular adenoma of colon 2013   Past Surgical History:  Procedure Laterality Date   ANAL FISSURE REPAIR  1990's   BREAST BIOPSY Left ~ [facility] EXCISIONAL BIOPSY Right    benign patient not sure of dates   CATARACT EXTRACTION Bilateral 06/02/2017 & 06/30/2017   child birth  1971; 1974   COLONOSCOPY  2013   EYE SURGERY     JOINT  REPLACEMENT     MASS EXCISION Left 10/20/2019   Procedure: EXCISION BACK LIPOMA;  Surgeon: Wallace Going, DO;  Location: [facility];  Service: Plastics;  Laterality: Left;   POLYPECTOMY     RESECTION DISTAL CLAVICAL Left 10/20/2019   Procedure: RESECTION DISTAL CLAVICAL;  Surgeon: Hiram Gash, MD;  Location: [facility];  Service: Orthopedics;  Laterality: Left;   SHOULDER ARTHROSCOPY WITH ROTATOR CUFF REPAIR AND SUBACROMIAL DECOMPRESSION Left 10/20/2019   Procedure: LEFT SHOULDER ARTHROSCOPY WITH ROTATOR CUFF REPAIR, DISTAL CLAVICULECTOMY, PARTIAL ACROMIOPLASTY;  Surgeon: Hiram Gash, MD;  Location: [place];  Service: Orthopedics;  Laterality: Left;   TOTAL KNEE ARTHROPLASTY Right 06/27/2015   Procedure: TOTAL KNEE ARTHROPLASTY;  Surgeon: Ninetta Lights, MD;  Location: [facility];  Service: Orthopedics;  Laterality: Right;   TOTAL KNEE ARTHROPLASTY WITH REVISION COMPONENTS Right 10/19/2017   TOTAL KNEE ARTHROPLASTY WITH REVISION COMPONENTS Right 10/19/2017   Procedure: TOTAL KNEE ARTHROPLASTY WITH REVISION COMPONENTS;  Surgeon: Renette Butters, MD;  Location: [facility];  Service: Orthopedics;  Laterality: Right;   Family History  Problem Relation Age of Onset   Arthritis Mother    Heart disease Mother    Lung disease Mother    Stroke Mother    Arthritis Father    Heart disease Father    Skin cancer Brother    Obesity Brother    Breast cancer Paternal Aunt    Colon cancer Neg Hx    Colon polyps Neg Hx    Esophageal cancer Neg Hx    Rectal cancer Neg Hx    Stomach cancer Neg Hx    Social History   Tobacco Use   Smoking status: Never   Smokeless tobacco: Never   Tobacco comments:    Divorced, lives alone and single  Vaping Use   Vaping Use: Never used  Substance Use Topics   Alcohol use: Not Currently    Comment: 06/28/2015 "I quit drinking in the 1990's; never drank hard liquor; nover drank much"   Drug use: No   Current  Outpatient Medications  Medication Sig Dispense Refill   ARIPiprazole (ABILIFY) 10 MG tablet Take 1 tablet by mouth daily.     b complex vitamins tablet Take 1 tablet by mouth daily.      Calcium Citrate-Vitamin D (CALCIUM CITRATE + D PO) Take 1 tablet by mouth 2 (two) times daily.     Cholecalciferol (VITAMIN D3) 1000 UNITS CAPS Take  1,000 Units by mouth 2 (two) times daily.      clonazePAM (KLONOPIN) 0.5 MG tablet Take 0.5-1 tablets (0.25-0.5 mg total) by mouth 3 (three) times daily as needed for anxiety. Take 1/2 tab twice daily if needed and 1 tab at bedtime as needed (Patient taking differently: Take 0.25-0.5 mg by mouth 3 (three) times daily as needed for anxiety (typically takes  1 tablet every morning).) 90 tablet 0   desvenlafaxine (PRISTIQ) 50 MG 24 hr tablet Take 1 tablet (50 mg total) by mouth at bedtime. 30 tablet 0   estradiol (CLIMARA - DOSED IN MG/24 HR) 0.025 mg/24hr patch Place 1 patch (0.025 mg total) onto the skin 6 days. In the evening. 12 patch 3   Multiple Vitamin (MULTI VITAMIN) TABS Take 1 tablet by mouth daily.     progesterone (PROMETRIUM) 100 MG capsule Take 1 capsule (100 mg total) by mouth daily. In the evening. 90 capsule 3   propranolol (INDERAL) 20 MG tablet Take 10-20 mg by mouth 3 (three) times daily as needed (for tremor (take 1 tablet scheduled in the morning)).      QUEtiapine (SEROQUEL) 25 MG tablet Take 1 tablet by mouth daily.     No current facility-administered medications for this visit.   Allergies  Allergen Reactions   Amoxicillin Hives    Has patient had a PCN reaction causing immediate rash, facial/tongue/throat swelling, SOB or lightheadedness with hypotension: No Has patient had a PCN reaction causing severe rash involving mucus membranes or skin necrosis: No Has patient had a PCN reaction that required hospitalization: No Has patient had a PCN reaction occurring within the last 10 years: Unknown If all of the above answers are "NO", then may  proceed with Cephalosporin use.     Penicillins Rash    Has patient had a PCN reaction causing immediate rash, facial/tongue/throat swelling, SOB or lightheadedness with hypotension: No Has patient had a PCN reaction causing severe rash involving mucus membranes or skin necrosis: No Has patient had a PCN reaction that required hospitalization: No Has patient had a PCN reaction occurring within the last 10 years: Unknown If all of the above answers are "NO", then may proceed with Cephalosporin use.     Review of Systems: All other systems reviewed and negative except where noted in HPI.    Physical Exam    Wt Readings from Last 3 Encounters:  07/11/21 166 lb 6.4 oz (75.5 kg)  06/10/21 161 lb (73 kg)  10/28/19 138 lb 12.8 oz (63 kg)    There were no vitals taken for this visit. Constitutional:  Generally well appearing female in no acute distress. Psychiatric: Pleasant. Normal mood and affect. Behavior is normal. HEENT: Pupils normal.  Conjunctivae are normal. No scleral icterus. Neck supple.  Cardiovascular: Normal rate, regular rhythm. No edema Pulmonary/chest: Effort normal and breath sounds normal. No wheezing, rales or rhonchi. Abdominal: Soft, nondistended, nontender. Bowel sounds active throughout. There are no masses palpable. No hepatomegaly. Rectal: Deferred to colonoscopy Neurological: Alert and oriented to person place and time. Skin: Skin is warm and dry. No rashes noted.  Lucio Edward, MD   cc:  Referring Provider Jacalyn Lefevre Jesse Sans, MD

## 2022-07-01 NOTE — Patient Instructions (Signed)
You can use over the counter preparation H suppositories daily x 5 days.   You have been scheduled for a colonoscopy. Please follow written instructions given to you at your visit today.  Please pick up your prep supplies at the pharmacy within the next 1-3 days. If you use inhalers (even only as needed), please bring them with you on the day of your procedure.  The [facility] providers would like to encourage you to use [facility] to communicate with providers for non-urgent requests or questions.  Due to long hold times on the telephone, sending your provider a message by [facility] may be a faster and more efficient way to get a response.  Please allow 48 business hours for a response.  Please remember that this is for non-urgent requests.   Due to recent changes in healthcare laws, you may see the results of your imaging and laboratory studies on MyChart before your provider has had a chance to review them.  We understand that in some cases there may be results that are confusing or concerning to you. Not all laboratory results come back in the same time frame and the provider may be waiting for multiple results in order to interpret others.  Please give [place] 48 hours in order for your provider to thoroughly review all the results before contacting the office for clarification of your results.   Thank you for choosing me and [facility].  Pricilla Riffle. Dagoberto Ligas., MD., Mar[facility] Regal

## 2022-07-07 ENCOUNTER — Encounter: Payer: Self-pay | Admitting: Gastroenterology

## 2022-07-07 ENCOUNTER — Ambulatory Visit (AMBULATORY_SURGERY_CENTER): Payer: Medicare HMO | Admitting: Gastroenterology

## 2022-07-07 VITALS — BP 122/68 | HR 65 | Temp 98.0°F | Resp 15 | Ht 63.0 in | Wt 162.0 lb

## 2022-07-07 DIAGNOSIS — Z86010 Personal history of colonic polyps: Secondary | ICD-10-CM | POA: Diagnosis not present

## 2022-07-07 DIAGNOSIS — D123 Benign neoplasm of transverse colon: Secondary | ICD-10-CM | POA: Diagnosis not present

## 2022-07-07 DIAGNOSIS — Z09 Encounter for follow-up examination after completed treatment for conditions other than malignant neoplasm: Secondary | ICD-10-CM

## 2022-07-07 DIAGNOSIS — D125 Benign neoplasm of sigmoid colon: Secondary | ICD-10-CM | POA: Diagnosis not present

## 2022-07-07 MED ORDER — SODIUM CHLORIDE 0.9 % IV SOLN: 500.0000 mL | INTRAVENOUS | Status: DC

## 2022-07-07 NOTE — Progress Notes (Signed)
Called to room to assist during endoscopic procedure.  Patient ID and intended procedure confirmed with present staff. Received instructions for my participation in the procedure from the performing physician.  

## 2022-07-07 NOTE — Op Note (Signed)
[facility] Patient Name: Leah Jensen Procedure Date: 07/07/2022 9:29 AM MRN: [id number] Endoscopist: Ladene Artist , MD, [id number] Age: 76 Referring MD:  Date of Birth: 1946-03-26 Gender: Female Account #: [id number] Procedure:                Colonoscopy Indications:              Surveillance: Personal history of adenomatous                            polyps on last colonoscopy 5 years ago Medicines:                Monitored Anesthesia Care Procedure:                Pre-Anesthesia Assessment:                           - Prior to the procedure, a History and Physical                            was performed, and patient medications and                            allergies were reviewed. The patient's tolerance of                            previous anesthesia was also reviewed. The risks                            and benefits of the procedure and the sedation                            options and risks were discussed with the patient.                            All questions were answered, and informed consent                            was obtained. Prior Anticoagulants: The patient has                            taken no anticoagulant or antiplatelet agents. ASA                            Grade Assessment: II - A patient with mild systemic                            disease. After reviewing the risks and benefits,                            the patient was deemed in satisfactory condition to                            undergo the procedure.  After obtaining informed consent, the colonoscope                            was passed under direct vision. Throughout the                            procedure, the patient's blood pressure, pulse, and                            oxygen saturations were monitored continuously. The                            CF [id number] #[id number] was introduced through the anus                            and advanced to  the the cecum, identified by                            appendiceal orifice and ileocecal valve. The                            ileocecal valve, appendiceal orifice, and rectum                            were photographed. The quality of the bowel                            preparation was good. The colonoscopy was performed                            without difficulty. The patient tolerated the                            procedure well. Scope In: 9:36:12 AM Scope Out: 9:59:57 AM Scope Withdrawal Time: 0 hours 20 minutes 34 seconds  Total Procedure Duration: 0 hours 23 minutes 45 seconds  Findings:                 Skin tags were found on perianal exam.                           A 6 mm polyp was found in the proximal transverse                            colon. The polyp was sessile. The polyp was removed                            with a cold snare. Resection and retrieval were                            complete.                           A 10 mm polyp was found in the distal sigmoid  colon. The polyp was pedunculated. The polyp was                            removed with a cold snare. Resection and retrieval                            were complete. Persistent oozing at the site. For                            hemostasis, one hemostatic clip was successfully                            placed (MR conditional). Clip manufacturer:                            [employer]. There was no bleeding at the end of the                            procedure.                           A 4 mm polyp was found in the distal sigmoid colon.                            The polyp was sessile. The polyp was removed with a                            cold biopsy forceps. Resection and retrieval were                            complete.                           Internal hemorrhoids were found during                            retroflexion. The hemorrhoids were small and Grade                             I (internal hemorrhoids that do not prolapse).                           The exam was otherwise without abnormality on                            direct and retroflexion views. Complications:            No immediate complications. Estimated blood loss:                            None. Estimated Blood Loss:     Estimated blood loss: none. Impression:               - Perianal skin tags found on perianal exam.                           -  One 6 mm polyp in the proximal transverse colon,                            removed with a cold snare. Resected and retrieved.                           - One 10 mm polyp in the distal sigmoid colon,                            removed with a cold snare. Resected and retrieved.                            Clip (MR conditional) was placed. Clip                            manufacturer: [employer].                           - One 4 mm polyp in the distal sigmoid colon,                            removed with a cold biopsy forceps. Resected and                            retrieved.                           - Internal hemorrhoids.                           - The examination was otherwise normal on direct                            and retroflexion views. Recommendation:           - Repeat colonoscopy vs no repeat due to after                            studies are complete for surveillance based on                            pathology results.                           - Patient has a contact number available for                            emergencies. The signs and symptoms of potential                            delayed complications were discussed with the                            patient. Return to normal activities tomorrow.  Written discharge instructions were provided to the                            patient.                           - Resume previous diet.                           - Continue present  medications.                           - Await pathology results.                           - No aspirin, ibuprofen, naproxen, or other                            non-steroidal anti-inflammatory drugs for 2 weeks                            after polyp removal. Ladene Artist, MD 07/07/2022 10:05:56 AM This report has been signed electronically.

## 2022-07-07 NOTE — Progress Notes (Signed)
Sedate, gd SR, tolerated procedure well, VSS, report to RN 

## 2022-07-07 NOTE — Progress Notes (Signed)
Pt's states no medical or surgical changes since previsit or office visit.Pt's states no medical or surgical changes since previsit or office visit. 

## 2022-07-07 NOTE — Progress Notes (Signed)
See 07/01/2022 H&P, no changes

## 2022-07-07 NOTE — Patient Instructions (Signed)
Information on polyps given to you today.  Await pathology results.  Resume previous diet and medications.  NO ASPIRIN, ASPIRIN CONTAINING PRODUCTS (BC OR GOODY POWDERS) OR NSAIDS (IBUPROFEN, ADVIL, ALEVE, AND MOTRIN) FOR 2 weeks post polyp removal; TYLENOL IS OK TO TAKE.   YOU HAD AN ENDOSCOPIC PROCEDURE TODAY AT [facility]:   Refer to the procedure report that was given to you for any specific questions about what was found during the examination.  If the procedure report does not answer your questions, please call your gastroenterologist to clarify.  If you requested that your care partner not be given the details of your procedure findings, then the procedure report has been included in a sealed envelope for you to review at your convenience later.  YOU SHOULD EXPECT: Some feelings of bloating in the abdomen. Passage of more gas than usual.  Walking can help get rid of the air that was put into your GI tract during the procedure and reduce the bloating. If you had a lower endoscopy (such as a colonoscopy or flexible sigmoidoscopy) you may notice spotting of blood in your stool or on the toilet paper. If you underwent a bowel prep for your procedure, you may not have a normal bowel movement for a few days.  Please Note:  You might notice some irritation and congestion in your nose or some drainage.  This is from the oxygen used during your procedure.  There is no need for concern and it should clear up in a day or so.  SYMPTOMS TO REPORT IMMEDIATELY:  Following lower endoscopy (colonoscopy or flexible sigmoidoscopy):  Excessive amounts of blood in the stool  Significant tenderness or worsening of abdominal pains  Swelling of the abdomen that is new, acute  Fever of 100F or higher   For urgent or emergent issues, a gastroenterologist can be reached at any hour by calling [phone]. Do not use MyChart messaging for urgent concerns.    DIET:  We do recommend a  small meal at first, but then you may proceed to your regular diet.  Drink plenty of fluids but you should avoid alcoholic beverages for 24 hours.  ACTIVITY:  You should plan to take it easy for the rest of today and you should NOT DRIVE or use heavy machinery until tomorrow (because of the sedation medicines used during the test).    FOLLOW UP: Our staff will call the number listed on your records the next business day following your procedure.  We will call around 7:15- 8:00 am to check on you and address any questions or concerns that you may have regarding the information given to you following your procedure. If we do not reach you, we will leave a message.     If any biopsies were taken you will be contacted by phone or by letter within the next 1-3 weeks.  Please call us at [phone] if you have not heard about the biopsies in 3 weeks.    SIGNATURES/CONFIDENTIALITY: You and/or your care partner have signed paperwork which will be entered into your electronic medical record.  These signatures attest to the fact that that the information above on your After Visit Summary has been reviewed and is understood.  Full responsibility of the confidentiality of this discharge information lies with you and/or your care-partner.

## 2022-07-08 ENCOUNTER — Telehealth: Payer: Self-pay

## 2022-07-08 NOTE — Telephone Encounter (Signed)
  Follow up Call-     07/07/2022    8:39 AM  Call back number  Post procedure Call Back phone  # [phone]  Permission to leave phone message Yes     Follow up call made.  NALM

## 2022-07-11 ENCOUNTER — Ambulatory Visit: Payer: Medicare HMO | Admitting: Cardiology

## 2022-07-28 ENCOUNTER — Encounter: Payer: Self-pay | Admitting: Gastroenterology

## 2022-07-28 DIAGNOSIS — H04123 Dry eye syndrome of bilateral lacrimal glands: Secondary | ICD-10-CM | POA: Diagnosis not present

## 2022-07-28 DIAGNOSIS — H52203 Unspecified astigmatism, bilateral: Secondary | ICD-10-CM | POA: Diagnosis not present

## 2022-07-28 DIAGNOSIS — Z961 Presence of intraocular lens: Secondary | ICD-10-CM | POA: Diagnosis not present

## 2022-07-28 DIAGNOSIS — H35372 Puckering of macula, left eye: Secondary | ICD-10-CM | POA: Diagnosis not present

## 2022-07-31 ENCOUNTER — Ambulatory Visit: Payer: Medicare HMO

## 2022-07-31 VITALS — BP 103/59 | HR 62 | Resp 16 | Ht 63.0 in | Wt 151.0 lb

## 2022-07-31 DIAGNOSIS — E781 Pure hyperglyceridemia: Secondary | ICD-10-CM | POA: Diagnosis not present

## 2022-07-31 DIAGNOSIS — R072 Precordial pain: Secondary | ICD-10-CM

## 2022-07-31 DIAGNOSIS — E7800 Pure hypercholesterolemia, unspecified: Secondary | ICD-10-CM

## 2022-07-31 NOTE — Progress Notes (Signed)
Date:  07/31/2022   ID:  Leah Jensen, DOB 1946/08/31, MRN [id number]  PCP:  Michael Boston, MD  Cardiologist:  Ernst Spell, DO, [facility] (established care 06/10/21)  Date: 07/31/22 Last Office Visit: 06/10/2021  Chief Complaint  Patient presents with   Chest Pain   Follow-up    1 year    HPI  Leah Jensen is a 76 y.o. female with past medical history and cardiovascular risk factors include: Hyperlipidemia, bipolar disorder, osteoporosis, benign essential tremor, advanced age, postmenopausal female.  She presents today for annual follow-up. She has ongoing episodes of chest discomfort. When the symptoms occur the discomfort to be substernally located, radiated towards her throat/neck, lasting for less than 5 minutes, intensity 5 out of 10, pressure-like sensation, not brought on by effort related activities and did not resolve with rest. These episodes have been occurring for approximately 3 years. The episodes occur intermittently and sometimes are every 6 months and sometimes every 2 weeks. She has not noticed any correlation between eating and chest discomfort episodes. She denies  shortness of breath, palpitations, leg edema, orthopnea, PND, TIA/syncope.   FUNCTIONAL STATUS: Currently works in a [employer] which keeps her active.  But no structured exercise program or daily routine.  ALLERGIES: Allergies  Allergen Reactions   Amoxicillin Hives    Has patient had a PCN reaction causing immediate rash, facial/tongue/throat swelling, SOB or lightheadedness with hypotension: No Has patient had a PCN reaction causing severe rash involving mucus membranes or skin necrosis: No Has patient had a PCN reaction that required hospitalization: No Has patient had a PCN reaction occurring within the last 10 years: Unknown If all of the above answers are "NO", then may proceed with Cephalosporin use.     Penicillins Rash    Has patient had a PCN reaction causing immediate  rash, facial/tongue/throat swelling, SOB or lightheadedness with hypotension: No Has patient had a PCN reaction causing severe rash involving mucus membranes or skin necrosis: No Has patient had a PCN reaction that required hospitalization: No Has patient had a PCN reaction occurring within the last 10 years: Unknown If all of the above answers are "NO", then may proceed with Cephalosporin use.     MEDICATION LIST PRIOR TO VISIT: Current Meds  Medication Sig   ARIPiprazole (ABILIFY) 10 MG tablet Take 1 tablet by mouth daily.   Calcium Citrate-Vitamin D (CALCIUM CITRATE + D PO) Take 1 tablet by mouth 2 (two) times daily.   Cholecalciferol (VITAMIN D3) 1000 UNITS CAPS Take 1,000 Units by mouth 2 (two) times daily.    clonazePAM (KLONOPIN) 0.5 MG tablet Take 0.5-1 tablets (0.25-0.5 mg total) by mouth 3 (three) times daily as needed for anxiety. Take 1/2 tab twice daily if needed and 1 tab at bedtime as needed (Patient taking differently: Take 0.25-0.5 mg by mouth 3 (three) times daily as needed for anxiety (typically takes  1 tablet every morning).)   desvenlafaxine (PRISTIQ) 50 MG 24 hr tablet Take 1 tablet (50 mg total) by mouth at bedtime.   estradiol (CLIMARA - DOSED IN MG/24 HR) 0.025 mg/24hr patch Place 1 patch (0.025 mg total) onto the skin 6 days. In the evening.   Multiple Vitamin (MULTI VITAMIN) TABS Take 1 tablet by mouth daily.   progesterone (PROMETRIUM) 100 MG capsule Take 1 capsule (100 mg total) by mouth daily. In the evening.   propranolol (INDERAL) 20 MG tablet Take 10-20 mg by mouth 3 (three) times daily as needed (for tremor (  take 1 tablet scheduled in the morning)).    QUEtiapine (SEROQUEL) 25 MG tablet Take 1 tablet by mouth daily.     PAST MEDICAL HISTORY: Past Medical History:  Diagnosis Date   ALLERGIC RHINITIS    Anxiety    Bipolar disorder ([place])    Cataract    slight on lt. eye   DEPRESSION/ANXIETY    Hyperlipidemia    Low back pain    Osteoarthritis     "probably qwhere/Dr. 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   DEXA -4.5 L spine, started Fosamax 09/2011, change to Prolia 04/2013   Palpitations    TREMOR, ESSENTIAL    Tubular adenoma of colon 2013    PAST SURGICAL HISTORY: Past Surgical History:  Procedure Laterality Date   ANAL FISSURE REPAIR  1990's   BREAST BIOPSY Left ~ [place] EXCISIONAL BIOPSY Right    benign patient not sure of dates   CATARACT EXTRACTION Bilateral 06/02/2017 & 06/30/2017   child birth  1970; 1973   COLONOSCOPY  2013   EYE SURGERY     JOINT REPLACEMENT     MASS EXCISION Left 10/20/2019   Procedure: EXCISION BACK LIPOMA;  Surgeon: Wallace Going, DO;  Location: [place];  Service: Plastics;  Laterality: Left;   POLYPECTOMY     RESECTION DISTAL CLAVICAL Left 10/20/2019   Procedure: RESECTION DISTAL CLAVICAL;  Surgeon: Hiram Gash, MD;  Location: [place];  Service: Orthopedics;  Laterality: Left;   SHOULDER ARTHROSCOPY WITH ROTATOR CUFF REPAIR AND SUBACROMIAL DECOMPRESSION Left 10/20/2019   Procedure: LEFT SHOULDER ARTHROSCOPY WITH ROTATOR CUFF REPAIR, DISTAL CLAVICULECTOMY, PARTIAL ACROMIOPLASTY;  Surgeon: Hiram Gash, MD;  Location: [place];  Service: Orthopedics;  Laterality: Left;   TOTAL KNEE ARTHROPLASTY Right 06/27/2015   Procedure: TOTAL KNEE ARTHROPLASTY;  Surgeon: Ninetta Lights, MD;  Location: [place];  Service: Orthopedics;  Laterality: Right;   TOTAL KNEE ARTHROPLASTY WITH REVISION COMPONENTS Right 10/19/2017   TOTAL KNEE ARTHROPLASTY WITH REVISION COMPONENTS Right 10/19/2017   Procedure: TOTAL KNEE ARTHROPLASTY WITH REVISION COMPONENTS;  Surgeon: Renette Butters, MD;  Location: [place];  Service: Orthopedics;  Laterality: Right;    FAMILY HISTORY: The patient family history includes Arthritis in her father and mother; Breast cancer in her paternal aunt; Heart disease in her father and mother; Lung disease in her  mother; Obesity in her brother; Skin cancer in her brother; Stroke in her mother.  SOCIAL HISTORY:  The patient  reports that she has never smoked. She has never used smokeless tobacco. She reports that she does not currently use alcohol. She reports that she does not use drugs.  REVIEW OF SYSTEMS: Review of Systems  Cardiovascular:  Positive for chest pain. Negative for dyspnea on exertion, leg swelling, near-syncope, orthopnea, palpitations and syncope.   PHYSICAL EXAM:    07/31/2022    3:25 PM 07/07/2022   10:25 AM 07/07/2022   10:15 AM  Vitals with BMI  Height '5\' 3"'$     Weight 151 lbs    BMI 78.67    Systolic [phone]  Diastolic 59 68 70  Pulse 62  65    Physical Exam Cardiovascular:     Rate and Rhythm: Normal rate and regular rhythm.     Pulses: Normal pulses.          Carotid pulses are 2+ on the right side and 2+ on the left side.  Radial pulses are 2+ on the right side and 2+ on the left side.       Dorsalis pedis pulses are 2+ on the right side and 2+ on the left side.     Heart sounds: Normal heart sounds. No murmur heard.    No gallop.  Pulmonary:     Effort: Pulmonary effort is normal. No respiratory distress.     Breath sounds: Normal breath sounds. No wheezing or rales.  Musculoskeletal:     Right lower leg: No edema.     Left lower leg: No edema.  Neurological:     Mental Status: She is alert.    CARDIAC DATABASE: EKG 07/31/2022: Sinus bradycardia at rate of 59 bpm.  Left axis deviation.  Left atrial enlargement.  No evidence of ischemia or underlying injury pattern.  Compared to previous EKG on 06/10/2021, no significant change.  Echocardiogram: 06/26/2021: Normal LV systolic function with visual EF 60-65%. Left ventricle cavity is normal in size. Mild left ventricular hypertrophy. Normal global wall motion. Normal diastolic filling pattern, normal LAP.  Mild (Grade I) aortic regurgitation.d Mild (Grade I) mitral regurgitation. Mild tricuspid  regurgitation. No evidence of pulmonary hypertension. No prior study for comparison.   Stress Testing: Exercise Myoview stress test 06/26/2021: 1 Day Rest/Stress Protocol. Exercise time 3 minutes 00 seconds on Bruce protocol, achieved 4.64 METS, 94% of APMHR Stress ECG negative for ischemia.  Without evidence of reversible myocardial ischemia or prior infarct. Calculated LVEF 76%, visually appears hyperdynamic, no regional wall motion normalities.  Low risk study.   Heart Catheterization: None  External Labs: Collected: 09/24/2020 provided by PCP Sodium 139, potassium 4.4, chloride 104, bicarb 24, BUN 12, creatinine 0.7 Hemoglobin 13.2 g/dL, hematocrit 43% Total cholesterol 234, triglycerides 198, HDL 61, LDL 133, non-HDL 173 TSH 2.52  LABORATORY DATA:    Latest Ref Rng & Units 03/31/2019    3:30 PM 12/08/2017    3:57 PM 10/05/2017    9:31 AM  CBC  WBC 4.0 - 10.5 K/uL 5.0  4.6  5.0   Hemoglobin 12.0 - 15.0 g/dL 13.4  13.3  14.0   Hematocrit 36.0 - 46.0 % 39.8  39.4  43.0   Platelets 150.0 - 400.0 K/uL 228.0  300.0  253        Latest Ref Rng & Units 03/31/2019    3:30 PM 12/08/2017    3:57 PM 10/05/2017    9:31 AM  CMP  Glucose 70 - 99 mg/dL 78  82  94   BUN 6 - 23 mg/dL '22  7  19   '$ Creatinine 0.40 - 1.20 mg/dL 0.76  0.66  0.74   Sodium 135 - 145 mEq/L 138  139  138   Potassium 3.5 - 5.1 mEq/L 4.4  3.7  4.0   Chloride 96 - 112 mEq/L 102  103  103   CO2 19 - 32 mEq/L '30  29  23   '$ Calcium 8.4 - 10.5 mg/dL 9.9  9.2  9.2   Total Protein 6.0 - 8.3 g/dL 6.5  7.0    Total Bilirubin 0.2 - 1.2 mg/dL 0.3  0.5    Alkaline Phos 39 - 117 U/L 54  59    AST 0 - 37 U/L 22  21    ALT 0 - 35 U/L 22  19      Lipid Panel     Component Value Date/Time   CHOL 233 (H) 03/31/2019 1530   TRIG 203.0 (H) 03/31/2019 1530  HDL 59.70 03/31/2019 1530   CHOLHDL 4 03/31/2019 1530   VLDL 40.6 (H) 03/31/2019 1530   LDLCALC 122 (H) 12/08/2017 1557   LDLDIRECT 68.0 03/31/2019 1530    HEMOGLOBIN  A1C Lab Results  Component Value Date   HGBA1C 5.4 12/08/2017    IMPRESSION:    ICD-10-CM   1. Precordial pain  R07.2 EKG 12-Lead    2. Pure hypercholesterolemia  E78.00     3. Hypertriglyceridemia  E78.1        RECOMMENDATIONS: Leah Jensen is a 76 y.o. female whose past medical history and cardiac risk factors include: Hyperlipidemia, bipolar disorder, osteoporosis, benign essential tremor, advanced age, postmenopausal female.  Precordial pain Low suspicion that chest discomfort is cardiac etiology given symptoms. However, we did discuss starting Ranexa for symptom management. At this time she would like to hold off on starting new medication and continue to monitor symptoms. She will notify office if symptoms change in frequency or duration. Reviewed the results of the echo and stress test. EKG: Nonischemic Echocardiogram: LVEF 16-01%, normal diastolic filling pattern, mild valvular heart disease.   Nuclear stress test: Low risk study  Patient is educated on the importance of improving her modifiable cardiovascular risk factors. She is also educated on the concept of balanced ischemia and therefore if she has recurrence of chest pain or similar chest pain that has increased in intensity, frequency, and/or duration she is asked to go to the closest ER via EMS for further evaluation and management.  She verbalizes understanding.   Shared decision was to follow-up on annual basis after her well visit for risk stratification or sooner if change in clinical status  Pure hypercholesterolemia Currently on Lipitor.   She denies myalgia or other side effects. Currently managed by primary care provider.  Hypertriglyceridemia Patient would like to continue with lifestyle changes and reevaluate. Recommended pharmacological therapy if clinically warranted based on her recent lipid profile.  Patient states that she will discuss it further with PCP.  Will have annual lab work with  PCP.  FINAL MEDICATION LIST END OF ENCOUNTER: No orders of the defined types were placed in this encounter.   Medications Discontinued During This Encounter  Medication Reason   b complex vitamins tablet      Current Outpatient Medications:    ARIPiprazole (ABILIFY) 10 MG tablet, Take 1 tablet by mouth daily., Disp: , Rfl:    Calcium Citrate-Vitamin D (CALCIUM CITRATE + D PO), Take 1 tablet by mouth 2 (two) times daily., Disp: , Rfl:    Cholecalciferol (VITAMIN D3) 1000 UNITS CAPS, Take 1,000 Units by mouth 2 (two) times daily. , Disp: , Rfl:    clonazePAM (KLONOPIN) 0.5 MG tablet, Take 0.5-1 tablets (0.25-0.5 mg total) by mouth 3 (three) times daily as needed for anxiety. Take 1/2 tab twice daily if needed and 1 tab at bedtime as needed (Patient taking differently: Take 0.25-0.5 mg by mouth 3 (three) times daily as needed for anxiety (typically takes  1 tablet every morning).), Disp: 90 tablet, Rfl: 0   desvenlafaxine (PRISTIQ) 50 MG 24 hr tablet, Take 1 tablet (50 mg total) by mouth at bedtime., Disp: 30 tablet, Rfl: 0   estradiol (CLIMARA - DOSED IN MG/24 HR) 0.025 mg/24hr patch, Place 1 patch (0.025 mg total) onto the skin 6 days. In the evening., Disp: 12 patch, Rfl: 3   Multiple Vitamin (MULTI VITAMIN) TABS, Take 1 tablet by mouth daily., Disp: , Rfl:    progesterone (PROMETRIUM) 100 MG capsule,  Take 1 capsule (100 mg total) by mouth daily. In the evening., Disp: 90 capsule, Rfl: 3   propranolol (INDERAL) 20 MG tablet, Take 10-20 mg by mouth 3 (three) times daily as needed (for tremor (take 1 tablet scheduled in the morning)). , Disp: , Rfl:    QUEtiapine (SEROQUEL) 25 MG tablet, Take 1 tablet by mouth daily., Disp: , Rfl:   Orders Placed This Encounter  Procedures   EKG 12-Lead     There are no Patient Instructions on file for this visit.   --Continue cardiac medications as reconciled in final medication list. --Return in about 1 year (around 08/01/2023) for Annual f/u. Or  sooner if needed. --Continue follow-up with your primary care physician regarding the management of your other chronic comorbid conditions.  Patient's questions and concerns were addressed to her satisfaction. She voices understanding of the instructions provided during this encounter.   This note was created using a voice recognition software as a result there may be grammatical errors inadvertently enclosed that do not reflect the nature of this encounter. Every attempt is made to correct such errors.   Ernst Spell, [place] Office: [phone] Pager: [phone]

## 2022-08-20 DIAGNOSIS — F3342 Major depressive disorder, recurrent, in full remission: Secondary | ICD-10-CM | POA: Diagnosis not present

## 2022-08-22 ENCOUNTER — Ambulatory Visit: Payer: Medicare HMO

## 2022-10-17 ENCOUNTER — Ambulatory Visit
Admission: RE | Admit: 2022-10-17 | Discharge: 2022-10-17 | Disposition: A | Discharge status: Home / Self Care | Payer: Medicare HMO | Source: Ambulatory Visit | Attending: Internal Medicine | Admitting: Internal Medicine

## 2022-10-17 DIAGNOSIS — Z1231 Encounter for screening mammogram for malignant neoplasm of breast: Secondary | ICD-10-CM

## 2022-10-22 ENCOUNTER — Other Ambulatory Visit: Payer: Self-pay | Admitting: Internal Medicine

## 2022-10-22 DIAGNOSIS — R928 Other abnormal and inconclusive findings on diagnostic imaging of breast: Secondary | ICD-10-CM

## 2022-11-03 ENCOUNTER — Ambulatory Visit
Admission: RE | Admit: 2022-11-03 | Discharge: 2022-11-03 | Disposition: A | Discharge status: Home / Self Care | Payer: Medicare HMO | Source: Ambulatory Visit | Attending: Internal Medicine | Admitting: Internal Medicine

## 2022-11-03 ENCOUNTER — Ambulatory Visit: Payer: Medicare HMO

## 2022-11-03 DIAGNOSIS — R928 Other abnormal and inconclusive findings on diagnostic imaging of breast: Secondary | ICD-10-CM

## 2022-11-03 DIAGNOSIS — R922 Inconclusive mammogram: Secondary | ICD-10-CM | POA: Diagnosis not present

## 2022-11-17 DIAGNOSIS — F3341 Major depressive disorder, recurrent, in partial remission: Secondary | ICD-10-CM | POA: Diagnosis not present

## 2023-01-06 DIAGNOSIS — Z79899 Other long term (current) drug therapy: Secondary | ICD-10-CM | POA: Diagnosis not present

## 2023-01-06 DIAGNOSIS — F319 Bipolar disorder, unspecified: Secondary | ICD-10-CM | POA: Diagnosis not present

## 2023-01-06 DIAGNOSIS — N390 Urinary tract infection, site not specified: Secondary | ICD-10-CM | POA: Diagnosis not present

## 2023-01-06 DIAGNOSIS — M810 Age-related osteoporosis without current pathological fracture: Secondary | ICD-10-CM | POA: Diagnosis not present

## 2023-01-06 DIAGNOSIS — Z6830 Body mass index (BMI) 30.0-30.9, adult: Secondary | ICD-10-CM | POA: Diagnosis not present

## 2023-01-06 DIAGNOSIS — E669 Obesity, unspecified: Secondary | ICD-10-CM | POA: Diagnosis not present

## 2023-01-06 DIAGNOSIS — E785 Hyperlipidemia, unspecified: Secondary | ICD-10-CM | POA: Diagnosis not present

## 2023-01-06 DIAGNOSIS — R635 Abnormal weight gain: Secondary | ICD-10-CM | POA: Diagnosis not present

## 2023-01-19 ENCOUNTER — Other Ambulatory Visit (HOSPITAL_COMMUNITY): Payer: Self-pay | Admitting: *Deleted

## 2023-01-20 ENCOUNTER — Ambulatory Visit (HOSPITAL_COMMUNITY)
Admission: RE | Admit: 2023-01-20 | Discharge: 2023-01-20 | Disposition: A | Discharge status: Home / Self Care | Payer: Medicare HMO | Source: Ambulatory Visit | Attending: Internal Medicine | Admitting: Internal Medicine

## 2023-01-20 DIAGNOSIS — M810 Age-related osteoporosis without current pathological fracture: Secondary | ICD-10-CM | POA: Diagnosis not present

## 2023-01-20 MED ORDER — DENOSUMAB 60 MG/ML ~~LOC~~ SOSY
PREFILLED_SYRINGE | SUBCUTANEOUS | Status: AC
  Administered 2023-01-20: 60 mg via SUBCUTANEOUS
  Filled 2023-01-20: qty 1

## 2023-01-20 MED ORDER — DENOSUMAB 60 MG/ML ~~LOC~~ SOSY: 60.0000 mg | PREFILLED_SYRINGE | Freq: Once | SUBCUTANEOUS | Status: AC

## 2023-03-16 DIAGNOSIS — Z01419 Encounter for gynecological examination (general) (routine) without abnormal findings: Secondary | ICD-10-CM | POA: Diagnosis not present

## 2023-03-16 DIAGNOSIS — Z79890 Hormone replacement therapy (postmenopausal): Secondary | ICD-10-CM | POA: Diagnosis not present

## 2023-03-26 DIAGNOSIS — F3341 Major depressive disorder, recurrent, in partial remission: Secondary | ICD-10-CM | POA: Diagnosis not present

## 2023-07-24 DIAGNOSIS — E785 Hyperlipidemia, unspecified: Secondary | ICD-10-CM | POA: Diagnosis not present

## 2023-07-27 DIAGNOSIS — Z79899 Other long term (current) drug therapy: Secondary | ICD-10-CM | POA: Diagnosis not present

## 2023-07-27 DIAGNOSIS — R7989 Other specified abnormal findings of blood chemistry: Secondary | ICD-10-CM | POA: Diagnosis not present

## 2023-07-27 DIAGNOSIS — E785 Hyperlipidemia, unspecified: Secondary | ICD-10-CM | POA: Diagnosis not present

## 2023-08-03 ENCOUNTER — Ambulatory Visit: Payer: Medicare HMO

## 2023-08-03 ENCOUNTER — Ambulatory Visit: Payer: Self-pay | Admitting: Cardiology

## 2023-08-21 ENCOUNTER — Ambulatory Visit: Payer: Medicare HMO | Admitting: Cardiology

## 2023-09-18 ENCOUNTER — Ambulatory Visit: Payer: Medicare HMO | Attending: Cardiology | Admitting: Cardiology

## 2023-09-18 ENCOUNTER — Encounter: Payer: Self-pay | Admitting: Cardiology

## 2023-09-18 VITALS — BP 100/68 | HR 67 | Resp 16 | Ht 63.0 in | Wt 168.2 lb

## 2023-09-18 DIAGNOSIS — E7800 Pure hypercholesterolemia, unspecified: Secondary | ICD-10-CM

## 2023-09-18 DIAGNOSIS — E781 Pure hyperglyceridemia: Secondary | ICD-10-CM | POA: Diagnosis not present

## 2023-09-18 DIAGNOSIS — R072 Precordial pain: Secondary | ICD-10-CM | POA: Diagnosis not present

## 2023-09-18 NOTE — Patient Instructions (Signed)
 Medication Instructions:  Your physician recommends that you continue on your current medications as directed. Please refer to the Current Medication list given to you today.  *If you need a refill on your cardiac medications before your next appointment, please call your pharmacy*  Lab Work: None ordered today. If you have labs (blood work) drawn today and your tests are completely normal, you will receive your results only by: MyChart Message (if you have MyChart) OR A paper copy in the mail If you have any lab test that is abnormal or we need to change your treatment, we will call you to review the results.  Testing/Procedures: Your physician has requested that you have an echocardiogram. Echocardiography is a painless test that uses sound waves to create images of your heart. It provides your doctor with information about the size and shape of your heart and how well your heart's chambers and valves are working. This procedure takes approximately one hour. There are no restrictions for this procedure. Please do NOT wear cologne, perfume, aftershave, or lotions (deodorant is allowed). Please arrive 15 minutes prior to your appointment time.  Please note: We ask at that you not bring children with you during ultrasound (echo/ vascular) testing. Due to room size and safety concerns, children are not allowed in the ultrasound rooms during exams. Our front office staff cannot provide observation of children in our lobby area while testing is being conducted. An adult accompanying a patient to their appointment will only be allowed in the ultrasound room at the discretion of the ultrasound technician under special circumstances. We apologize for any inconvenience.   Follow-Up: At [facility], you and your health needs are our priority.  As part of our continuing mission to provide you with exceptional heart care, we have created designated Provider Care Teams.  These Care Teams include your  primary Cardiologist (physician) and Advanced Practice Providers (APPs -  Physician Assistants and Nurse Practitioners) who all work together to provide you with the care you need, when you need it.  We recommend signing up for the patient portal called "MyChart".  Sign up information is provided on this After Visit Summary.  MyChart is used to connect with patients for Virtual Visits (Telemedicine).  Patients are able to view lab/test results, encounter notes, upcoming appointments, etc.  Non-urgent messages can be sent to your provider as well.   To learn more about what you can do with MyChart, go to [email or web address].    Your next appointment:   1 year(s)  The format for your next appointment:   In Person  Provider:   Tessa Lerner, DO {

## 2023-09-18 NOTE — Progress Notes (Signed)
Cardiology Office Note:  .   Date:  09/18/2023  ID:  Leah Jensen, DOB [birth year], MRN [id number] PCP:  Melida Quitter, MD  Former Cardiology Providers: NA [facility] Providers Cardiologist:  Tessa Lerner, DO , [facility] (established care 09/18/23) Electrophysiologist:  None  Click to update primary MD,subspecialty MD or APP then REFRESH:1}    Chief Complaint  Patient presents with   Precordial pain   Follow-up    History of Present Illness: [name]   Leah Jensen is a 78 y.o. Caucasian female whose past medical history and cardiovascular risk factors includes: Hyperlipidemia, bipolar disorder, osteoporosis, benign essential tremor, advanced age, postmenopausal female.   Patient presents to the office for reevaluation of chest pain.  Chest pain: Anterior chest wall. Describes as a tightness like sensation. Improves with drinking water. Not brought on by effort related activities Discomfort is not improved with rest. Usually self-limited.  Intermittently has lower extremity swelling which improves with compression stockings.  Denies orthopnea or PND.  Patient states that she started on Lipitor about a year ago and her lipids and triglycerides are being managed by PCP.  She will have repeat labs in 1 month.   Review of Systems: .   Review of Systems  Cardiovascular:  Positive for chest pain (Noncardiac). Negative for claudication, irregular heartbeat, leg swelling, near-syncope, orthopnea, palpitations, paroxysmal nocturnal dyspnea and syncope.  Respiratory:  Negative for shortness of breath.   Hematologic/Lymphatic: Negative for bleeding problem.    Studies Reviewed:   EKG: EKG Interpretation Date/Time:  Friday September 18 2023 15:15:05 EST Ventricular Rate:  68 PR Interval:  186 QRS Duration:  76 QT Interval:  382 QTC Calculation: 406 R Axis:   -12  Text Interpretation: Normal sinus rhythm Normal ECG When compared with ECG of 15-Jun-2015 13:06, No  significant change since last tracing Confirmed by Tessa Lerner [phone]) on 09/18/2023 3:20:08 PM  Echocardiogram: 06/26/2021: Normal LV systolic function with visual EF 60-65%. Left ventricle cavity is normal in size. Mild left ventricular hypertrophy. Normal global wall motion. Normal diastolic filling pattern, normal LAP.  Mild (Grade I) aortic regurgitation. Mild (Grade I) mitral regurgitation. Mild tricuspid regurgitation. No evidence of pulmonary hypertension. No prior study for comparison.  Stress Testing: Exercise Myoview stress test 06/26/2021: Low risk study.   RADIOLOGY: NA  Risk Assessment/Calculations:   N/A   Labs:       Latest Ref Rng & Units 03/31/2019    3:30 PM 12/08/2017    3:57 PM 10/05/2017    9:31 AM  CBC  WBC 4.0 - 10.5 K/uL 5.0  4.6  5.0   Hemoglobin 12.0 - 15.0 g/dL 75.6  43.3  29.5   Hematocrit 36.0 - 46.0 % 39.8  39.4  43.0   Platelets 150.0 - 400.0 K/uL 228.0  300.0  253        Latest Ref Rng & Units 03/31/2019    3:30 PM 12/08/2017    3:57 PM 10/05/2017    9:31 AM  BMP  Glucose 70 - 99 mg/dL 78  82  94   BUN 6 - 23 mg/dL 22  7  19    Creatinine 0.40 - 1.20 mg/dL 1.88  4.16  6.06   Sodium 135 - 145 mEq/L 138  139  138   Potassium 3.5 - 5.1 mEq/L 4.4  3.7  4.0   Chloride 96 - 112 mEq/L 102  103  103   CO2 19 - 32 mEq/L 30  29  23  Calcium 8.4 - 10.5 mg/dL 9.9  9.2  9.2       Latest Ref Rng & Units 03/31/2019    3:30 PM 12/08/2017    3:57 PM 10/05/2017    9:31 AM  CMP  Glucose 70 - 99 mg/dL 78  82  94   BUN 6 - 23 mg/dL 22  7  19    Creatinine 0.40 - 1.20 mg/dL 4.09  8.11  9.14   Sodium 135 - 145 mEq/L 138  139  138   Potassium 3.5 - 5.1 mEq/L 4.4  3.7  4.0   Chloride 96 - 112 mEq/L 102  103  103   CO2 19 - 32 mEq/L 30  29  23    Calcium 8.4 - 10.5 mg/dL 9.9  9.2  9.2   Total Protein 6.0 - 8.3 g/dL 6.5  7.0    Total Bilirubin 0.2 - 1.2 mg/dL 0.3  0.5    Alkaline Phos 39 - 117 U/L 54  59    AST 0 - 37 U/L 22  21    ALT 0 - 35 U/L 22  19       Lab Results  Component Value Date   CHOL 233 (H) 03/31/2019   HDL 59.70 03/31/2019   LDLCALC 122 (H) 12/08/2017   LDLDIRECT 68.0 03/31/2019   TRIG 203.0 (H) 03/31/2019   CHOLHDL 4 03/31/2019   No results for input(s): "LIPOA" in the last 8760 hours. No components found for: "NTPROBNP" No results for input(s): "PROBNP" in the last 8760 hours. No results for input(s): "TSH" in the last 8760 hours.  External Labs: Collected: 07/27/2023 [facility] database. Total cholesterol 205, triglycerides 193, HDL 56, LDL 110 A1c 5.2. TSH 3.19  Physical Exam:    Today's Vitals   09/18/23 1511  BP: 100/68  Pulse: 67  Resp: 16  SpO2: 90%  Weight: 168 lb 3.2 oz (76.3 kg)  Height: 5\' 3"  (1.6 m)   Body mass index is 29.8 kg/m. Wt Readings from Last 3 Encounters:  09/18/23 168 lb 3.2 oz (76.3 kg)  07/31/22 151 lb (68.5 kg)  07/07/22 162 lb (73.5 kg)    Physical Exam  Constitutional: No distress.  hemodynamically stable  Neck: No JVD present.  Cardiovascular: Normal rate, regular rhythm, S1 normal and S2 normal. Exam reveals no gallop, no S3 and no S4.  No murmur heard. Pulmonary/Chest: Effort normal and breath sounds normal. No stridor. She has no wheezes. She has no rales.  Abdominal: Soft. Bowel sounds are normal. She exhibits no distension. There is no abdominal tenderness.  Musculoskeletal:        General: No edema.     Cervical back: Neck supple.  Neurological: She is alert and oriented to person, place, and time. She has intact cranial nerves (2-12).  Skin: Skin is warm.     Impression & Recommendation(s):  Impression:   ICD-10-CM   1. Precordial pain  R07.2 EKG 12-Lead    ECHOCARDIOGRAM COMPLETE    2. Pure hypercholesterolemia  E78.00     3. Hypertriglyceridemia  E78.1        Recommendation(s):  Precordial pain Symptoms of precordial discomfort are predominantly noncardiac. Has risk factors for CAD. EKG: Nonischemic. Prior MPI: Low risk study. Echo will be  ordered to evaluate for structural heart disease and left ventricular systolic function. Educated on the importance of improving her modifiable cardiovascular risk factors.  Her lipids/triglycerides will be managed by PCP. Recommended that she follows up with PCP and/or GI  for evaluation of heartburn/GERD.  Pure hypercholesterolemia Hypertriglyceridemia Currently on atorvastatin, per patient.   She denies myalgia or other side effects. Most recent lipids dated November 2024, independently reviewed as noted above.  During this office visit discussed management of at least 2 chronic comorbid conditions, reviewed prior echocardiogram results, stress test results, EKG ordered and independently reviewed, discussed management of current symptoms and comorbid conditions, and additional diagnostic workup ordered.  Orders Placed:  Orders Placed This Encounter  Procedures   EKG 12-Lead   ECHOCARDIOGRAM COMPLETE    Standing Status:   Future    Expected Date:   09/25/2023    Expiration Date:   09/17/2024    Where should this test be performed:   [facility])    Does the patient weigh less than or greater than 250 lbs?:   Patient weighs less than 250 lbs    Perflutren DEFINITY (image enhancing agent) should be administered unless hypersensitivity or allergy exist:   Administer Perflutren    Reason for exam-Echo:   Other-Full Diagnosis List    Full ICD-10/Reason for Exam:   Precordial pain [786.51.ICD-9-CM]   Final Medication List:   No orders of the defined types were placed in this encounter.   There are no discontinued medications.   Current Outpatient Medications:    ARIPiprazole (ABILIFY) 10 MG tablet, Take 1 tablet by mouth daily., Disp: , Rfl:    Calcium Citrate-Vitamin D (CALCIUM CITRATE + D PO), Take 1 tablet by mouth 2 (two) times daily., Disp: , Rfl:    Cholecalciferol (VITAMIN D3) 1000 UNITS CAPS, Take 1,000 Units by mouth 2 (two) times daily. , Disp: , Rfl:     clonazePAM (KLONOPIN) 0.5 MG tablet, Take 0.5-1 tablets (0.25-0.5 mg total) by mouth 3 (three) times daily as needed for anxiety. Take 1/2 tab twice daily if needed and 1 tab at bedtime as needed (Patient taking differently: Take 0.25-0.5 mg by mouth 3 (three) times daily as needed for anxiety (typically takes  1 tablet every morning).), Disp: 90 tablet, Rfl: 0   desvenlafaxine (PRISTIQ) 50 MG 24 hr tablet, Take 1 tablet (50 mg total) by mouth at bedtime., Disp: 30 tablet, Rfl: 0   estradiol (CLIMARA - DOSED IN MG/24 HR) 0.025 mg/24hr patch, Place 1 patch (0.025 mg total) onto the skin 6 days. In the evening., Disp: 12 patch, Rfl: 3   Multiple Vitamin (MULTI VITAMIN) TABS, Take 1 tablet by mouth daily., Disp: , Rfl:    progesterone (PROMETRIUM) 100 MG capsule, Take 1 capsule (100 mg total) by mouth daily. In the evening., Disp: 90 capsule, Rfl: 3   propranolol (INDERAL) 20 MG tablet, Take 10-20 mg by mouth 3 (three) times daily as needed (for tremor (take 1 tablet scheduled in the morning)). , Disp: , Rfl:    QUEtiapine (SEROQUEL) 25 MG tablet, Take 1 tablet by mouth daily., Disp: , Rfl:   Consent:   NA  Disposition:   1 year follow-up sooner if needed  Patient may be asked to follow-up sooner based on the results of the above-mentioned testing.  Her questions and concerns were addressed to her satisfaction. She voices understanding of the recommendations provided during this encounter.    Signed, Tessa Lerner, DO, [facility]  [address] [place], [place] [address] 09/18/2023 3:59 PM

## 2023-10-06 ENCOUNTER — Ambulatory Visit (HOSPITAL_COMMUNITY): Payer: Medicare HMO | Attending: Cardiology

## 2023-10-06 DIAGNOSIS — R072 Precordial pain: Secondary | ICD-10-CM | POA: Insufficient documentation

## 2023-10-06 LAB — ECHOCARDIOGRAM COMPLETE
Area-P 1/2: 4.26 cm2
P 1/2 time: 700 ms
S' Lateral: 1.8 cm

## 2023-10-09 ENCOUNTER — Encounter: Payer: Self-pay | Admitting: Cardiology

## 2023-10-12 DIAGNOSIS — H35372 Puckering of macula, left eye: Secondary | ICD-10-CM | POA: Diagnosis not present

## 2023-10-12 DIAGNOSIS — H52203 Unspecified astigmatism, bilateral: Secondary | ICD-10-CM | POA: Diagnosis not present

## 2023-10-12 DIAGNOSIS — Z961 Presence of intraocular lens: Secondary | ICD-10-CM | POA: Diagnosis not present

## 2023-10-12 DIAGNOSIS — H04123 Dry eye syndrome of bilateral lacrimal glands: Secondary | ICD-10-CM | POA: Diagnosis not present

## 2023-10-13 DIAGNOSIS — Z23 Encounter for immunization: Secondary | ICD-10-CM | POA: Diagnosis not present

## 2023-10-13 DIAGNOSIS — M810 Age-related osteoporosis without current pathological fracture: Secondary | ICD-10-CM | POA: Diagnosis not present

## 2023-10-13 DIAGNOSIS — Z6829 Body mass index (BMI) 29.0-29.9, adult: Secondary | ICD-10-CM | POA: Diagnosis not present

## 2023-10-13 DIAGNOSIS — E785 Hyperlipidemia, unspecified: Secondary | ICD-10-CM | POA: Diagnosis not present

## 2023-10-13 DIAGNOSIS — M7989 Other specified soft tissue disorders: Secondary | ICD-10-CM | POA: Diagnosis not present

## 2023-10-13 DIAGNOSIS — Z1339 Encounter for screening examination for other mental health and behavioral disorders: Secondary | ICD-10-CM | POA: Diagnosis not present

## 2023-10-13 DIAGNOSIS — Z1331 Encounter for screening for depression: Secondary | ICD-10-CM | POA: Diagnosis not present

## 2023-10-13 DIAGNOSIS — Z0000 Encounter for general adult medical examination without abnormal findings: Secondary | ICD-10-CM | POA: Diagnosis not present

## 2023-10-13 DIAGNOSIS — F319 Bipolar disorder, unspecified: Secondary | ICD-10-CM | POA: Diagnosis not present

## 2023-10-14 ENCOUNTER — Other Ambulatory Visit: Payer: Self-pay | Admitting: Internal Medicine

## 2023-10-14 DIAGNOSIS — M810 Age-related osteoporosis without current pathological fracture: Secondary | ICD-10-CM

## 2023-10-20 ENCOUNTER — Other Ambulatory Visit: Payer: Self-pay | Admitting: Internal Medicine

## 2023-10-20 DIAGNOSIS — E785 Hyperlipidemia, unspecified: Secondary | ICD-10-CM

## 2023-11-18 ENCOUNTER — Other Ambulatory Visit: Payer: Self-pay | Admitting: Internal Medicine

## 2023-11-18 ENCOUNTER — Ambulatory Visit
Admission: RE | Admit: 2023-11-18 | Discharge: 2023-11-18 | Disposition: A | Discharge status: Home / Self Care | Payer: Medicare HMO | Source: Ambulatory Visit | Attending: Internal Medicine | Admitting: Internal Medicine

## 2023-11-18 DIAGNOSIS — Z0000 Encounter for general adult medical examination without abnormal findings: Secondary | ICD-10-CM

## 2023-11-18 DIAGNOSIS — I2510 Atherosclerotic heart disease of native coronary artery without angina pectoris: Secondary | ICD-10-CM | POA: Diagnosis not present

## 2023-11-18 DIAGNOSIS — E785 Hyperlipidemia, unspecified: Secondary | ICD-10-CM

## 2023-11-25 ENCOUNTER — Ambulatory Visit
Admission: RE | Admit: 2023-11-25 | Discharge: 2023-11-25 | Disposition: A | Discharge status: Home / Self Care | Source: Ambulatory Visit | Attending: Internal Medicine | Admitting: Internal Medicine

## 2023-11-25 DIAGNOSIS — Z1231 Encounter for screening mammogram for malignant neoplasm of breast: Secondary | ICD-10-CM | POA: Diagnosis not present

## 2023-11-25 DIAGNOSIS — Z0000 Encounter for general adult medical examination without abnormal findings: Secondary | ICD-10-CM

## 2024-04-12 ENCOUNTER — Ambulatory Visit (HOSPITAL_BASED_OUTPATIENT_CLINIC_OR_DEPARTMENT_OTHER)
Admission: RE | Admit: 2024-04-12 | Discharge: 2024-04-12 | Disposition: A | Discharge status: Home / Self Care | Source: Ambulatory Visit | Attending: Internal Medicine | Admitting: Internal Medicine

## 2024-04-12 DIAGNOSIS — M810 Age-related osteoporosis without current pathological fracture: Secondary | ICD-10-CM | POA: Diagnosis not present

## 2024-04-12 DIAGNOSIS — Z780 Asymptomatic menopausal state: Secondary | ICD-10-CM | POA: Diagnosis not present

## 2024-04-22 DIAGNOSIS — M25473 Effusion, unspecified ankle: Secondary | ICD-10-CM | POA: Diagnosis not present

## 2024-04-22 DIAGNOSIS — Z713 Dietary counseling and surveillance: Secondary | ICD-10-CM | POA: Diagnosis not present

## 2024-04-22 DIAGNOSIS — F319 Bipolar disorder, unspecified: Secondary | ICD-10-CM | POA: Diagnosis not present

## 2024-04-22 DIAGNOSIS — Z79899 Other long term (current) drug therapy: Secondary | ICD-10-CM | POA: Diagnosis not present

## 2024-04-22 DIAGNOSIS — E785 Hyperlipidemia, unspecified: Secondary | ICD-10-CM | POA: Diagnosis not present

## 2024-04-22 DIAGNOSIS — Z6826 Body mass index (BMI) 26.0-26.9, adult: Secondary | ICD-10-CM | POA: Diagnosis not present

## 2024-04-22 DIAGNOSIS — M810 Age-related osteoporosis without current pathological fracture: Secondary | ICD-10-CM | POA: Diagnosis not present

## 2024-04-25 ENCOUNTER — Telehealth (HOSPITAL_COMMUNITY): Payer: Self-pay

## 2024-04-25 NOTE — Telephone Encounter (Signed)
 Auth Submission: NO AUTH NEEDED Site of care: [facility] Payer: [employer] Medication & CPT/J Code(s) submitted: Reclast (Zolendronic acid) J3489 Diagnosis Code: M81.0 Route of submission (phone, fax, portal):  Phone # Fax # Auth type: Buy/Bill HB Units/visits requested: 5mg  x 1 dose Reference number:  Approval from: 04/25/24 to 08/31/24

## 2024-04-26 DIAGNOSIS — F331 Major depressive disorder, recurrent, moderate: Secondary | ICD-10-CM | POA: Diagnosis not present

## 2024-05-11 ENCOUNTER — Other Ambulatory Visit (HOSPITAL_COMMUNITY): Payer: Self-pay | Admitting: Internal Medicine

## 2024-05-13 ENCOUNTER — Ambulatory Visit (HOSPITAL_COMMUNITY)
Admission: RE | Admit: 2024-05-13 | Discharge: 2024-05-13 | Disposition: A | Discharge status: Home / Self Care | Source: Ambulatory Visit | Attending: Internal Medicine | Admitting: Internal Medicine

## 2024-05-13 VITALS — BP 108/54 | HR 68 | Temp 98.1°F | Resp 19

## 2024-05-13 DIAGNOSIS — M810 Age-related osteoporosis without current pathological fracture: Secondary | ICD-10-CM | POA: Diagnosis not present

## 2024-05-13 MED ORDER — ZOLEDRONIC ACID 5 MG/100ML IV SOLN
5.0000 mg | Freq: Once | INTRAVENOUS | Status: AC
  Administered 2024-05-13: 5 mg via INTRAVENOUS

## 2024-05-13 MED ORDER — ZOLEDRONIC ACID 5 MG/100ML IV SOLN
INTRAVENOUS | Status: AC
  Filled 2024-05-13: qty 100

## 2024-05-13 MED ORDER — SODIUM CHLORIDE 0.9 % IV SOLN: INTRAVENOUS | Status: DC

## 2024-05-25 ENCOUNTER — Telehealth: Payer: Self-pay | Admitting: Plastic Surgery

## 2024-05-25 NOTE — Telephone Encounter (Signed)
$  60.00 Consult knee surgery scar, scar revision   Lvmail to call office

## 2024-05-25 NOTE — Telephone Encounter (Signed)
 Hi, patient will need consult appointment for scar revision

## 2024-06-02 DIAGNOSIS — Z1152 Encounter for screening for COVID-19: Secondary | ICD-10-CM | POA: Diagnosis not present

## 2024-06-02 DIAGNOSIS — R5383 Other fatigue: Secondary | ICD-10-CM | POA: Diagnosis not present

## 2024-06-02 DIAGNOSIS — R059 Cough, unspecified: Secondary | ICD-10-CM | POA: Diagnosis not present

## 2024-06-02 DIAGNOSIS — J029 Acute pharyngitis, unspecified: Secondary | ICD-10-CM | POA: Diagnosis not present

## 2024-06-02 DIAGNOSIS — J069 Acute upper respiratory infection, unspecified: Secondary | ICD-10-CM | POA: Diagnosis not present

## 2024-06-08 ENCOUNTER — Other Ambulatory Visit: Payer: Medicare HMO

## 2024-06-24 DIAGNOSIS — Z01419 Encounter for gynecological examination (general) (routine) without abnormal findings: Secondary | ICD-10-CM | POA: Diagnosis not present

## 2024-06-24 DIAGNOSIS — Z79890 Hormone replacement therapy (postmenopausal): Secondary | ICD-10-CM | POA: Diagnosis not present

## 2024-07-25 ENCOUNTER — Ambulatory Visit (INDEPENDENT_AMBULATORY_CARE_PROVIDER_SITE_OTHER): Payer: Self-pay | Admitting: Plastic Surgery

## 2024-07-25 ENCOUNTER — Encounter: Payer: Self-pay | Admitting: Plastic Surgery

## 2024-07-25 VITALS — BP 122/75 | HR 68 | Ht 63.0 in | Wt 156.2 lb

## 2024-07-25 DIAGNOSIS — L905 Scar conditions and fibrosis of skin: Secondary | ICD-10-CM | POA: Insufficient documentation

## 2024-07-25 NOTE — Progress Notes (Signed)
   Subjective:    Patient ID: Leah Jensen, female    DOB: [birth year], 78 y.o.   MRN: [id number]  The patient is a 78 year old female here for evaluation of her right knee.  The patient states that she had knee surgery and now has some scarring and some contracture in the area.  Is more noticeable when she stands up and it tethers at the superior border.  There is no sign of redness or infection.  The patient says that sometimes it bothers her.  I am concerned that opening that up may cause more of an issue than leaving it alone.  The patient is going to think it over.      Review of Systems  Constitutional: Negative.   Eyes: Negative.   Respiratory: Negative.    Cardiovascular: Negative.   Gastrointestinal: Negative.   Endocrine: Negative.   Genitourinary: Negative.   Musculoskeletal: Negative.        Objective:   Physical Exam Vitals reviewed.  Cardiovascular:     Rate and Rhythm: Normal rate.     Pulses: Normal pulses.  Skin:    General: Skin is warm.     Coloration: Skin is not jaundiced.     Findings: No lesion.  Neurological:     Mental Status: She is alert and oriented to person, place, and time.  Psychiatric:        Mood and Affect: Mood normal.        Behavior: Behavior normal.        Thought Content: Thought content normal.        Judgment: Judgment normal.        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  Pictures were obtained of the patient and placed in the chart with the patient's or guardian's permission. The patient is also interested in laser and facial rejuvenation.  I refer her to [facility] and to Dr. Montorfano.

## 2024-08-08 ENCOUNTER — Ambulatory Visit: Payer: Self-pay

## 2024-08-10 ENCOUNTER — Encounter: Payer: Self-pay | Admitting: Cardiology

## 2024-08-15 ENCOUNTER — Ambulatory Visit: Payer: Self-pay

## 2024-08-19 ENCOUNTER — Institutional Professional Consult (permissible substitution)

## 2024-08-23 ENCOUNTER — Institutional Professional Consult (permissible substitution): Payer: Self-pay | Admitting: Plastic Surgery
